# Patient Record
Sex: Female | Born: 1950 | Race: Black or African American | Hispanic: No | Marital: Single | Smoking: Never smoker
Health system: Southern US, Community
[De-identification: ages and names within clinical notes are randomized; demographics above are authoritative.]

## PROBLEM LIST (undated history)

## (undated) DIAGNOSIS — R200 Anesthesia of skin: Secondary | ICD-10-CM

## (undated) DIAGNOSIS — R42 Dizziness and giddiness: Secondary | ICD-10-CM

## (undated) DIAGNOSIS — Z972 Presence of dental prosthetic device (complete) (partial): Secondary | ICD-10-CM

## (undated) DIAGNOSIS — Z974 Presence of external hearing-aid: Secondary | ICD-10-CM

## (undated) DIAGNOSIS — R112 Nausea with vomiting, unspecified: Secondary | ICD-10-CM

## (undated) DIAGNOSIS — M1990 Unspecified osteoarthritis, unspecified site: Secondary | ICD-10-CM

## (undated) DIAGNOSIS — R0602 Shortness of breath: Secondary | ICD-10-CM

## (undated) DIAGNOSIS — I10 Essential (primary) hypertension: Secondary | ICD-10-CM

## (undated) DIAGNOSIS — T8859XA Other complications of anesthesia, initial encounter: Secondary | ICD-10-CM

## (undated) DIAGNOSIS — T4145XA Adverse effect of unspecified anesthetic, initial encounter: Secondary | ICD-10-CM

## (undated) DIAGNOSIS — Z9889 Other specified postprocedural states: Secondary | ICD-10-CM

## (undated) HISTORY — PX: HEEL SPUR SURGERY: SHX665

## (undated) HISTORY — PX: CARPAL TUNNEL RELEASE: SHX101

## (undated) HISTORY — PX: FOOT SURGERY: SHX648

## (undated) HISTORY — PX: TUBAL LIGATION: SHX77

---

## 2003-05-05 HISTORY — PX: BREAST SURGERY: SHX581

## 2005-01-29 ENCOUNTER — Ambulatory Visit: Payer: Self-pay

## 2005-01-30 ENCOUNTER — Ambulatory Visit: Payer: Self-pay | Admitting: Podiatry

## 2005-03-23 ENCOUNTER — Ambulatory Visit: Payer: Self-pay | Admitting: Gastroenterology

## 2006-03-02 ENCOUNTER — Inpatient Hospital Stay: Payer: Self-pay | Admitting: Internal Medicine

## 2006-03-02 ENCOUNTER — Other Ambulatory Visit: Payer: Self-pay

## 2008-05-04 HISTORY — PX: BACK SURGERY: SHX140

## 2008-10-17 ENCOUNTER — Ambulatory Visit: Payer: Self-pay | Admitting: Obstetrics and Gynecology

## 2008-10-24 ENCOUNTER — Ambulatory Visit: Payer: Self-pay | Admitting: Obstetrics and Gynecology

## 2009-05-11 ENCOUNTER — Ambulatory Visit: Payer: Self-pay | Admitting: General Practice

## 2009-06-17 ENCOUNTER — Ambulatory Visit: Payer: Self-pay | Admitting: Unknown Physician Specialty

## 2009-06-21 ENCOUNTER — Ambulatory Visit: Payer: Self-pay | Admitting: Unknown Physician Specialty

## 2011-02-24 ENCOUNTER — Ambulatory Visit: Payer: Self-pay | Admitting: Family Medicine

## 2011-03-04 ENCOUNTER — Ambulatory Visit: Payer: Self-pay | Admitting: Family Medicine

## 2011-03-13 ENCOUNTER — Ambulatory Visit: Payer: Self-pay | Admitting: Unknown Physician Specialty

## 2011-03-17 ENCOUNTER — Emergency Department: Payer: Self-pay | Admitting: Emergency Medicine

## 2011-03-31 ENCOUNTER — Ambulatory Visit: Payer: Self-pay | Admitting: Surgery

## 2011-03-31 HISTORY — PX: BREAST BIOPSY: SHX20

## 2011-04-01 LAB — PATHOLOGY REPORT

## 2011-04-22 ENCOUNTER — Encounter: Payer: Self-pay | Admitting: Physician Assistant

## 2011-05-05 ENCOUNTER — Encounter: Payer: Self-pay | Admitting: Physician Assistant

## 2011-05-05 HISTORY — PX: JOINT REPLACEMENT: SHX530

## 2011-09-09 ENCOUNTER — Ambulatory Visit: Payer: Self-pay | Admitting: Unknown Physician Specialty

## 2011-09-09 LAB — POTASSIUM: Potassium: 3.5 mmol/L (ref 3.5–5.1)

## 2011-09-16 ENCOUNTER — Ambulatory Visit: Payer: Self-pay | Admitting: Unknown Physician Specialty

## 2011-09-21 ENCOUNTER — Emergency Department: Payer: Self-pay | Admitting: Emergency Medicine

## 2011-09-21 LAB — COMPREHENSIVE METABOLIC PANEL
Albumin: 3.4 g/dL (ref 3.4–5.0)
Alkaline Phosphatase: 99 U/L (ref 50–136)
Anion Gap: 8 (ref 7–16)
BUN: 12 mg/dL (ref 7–18)
Bilirubin,Total: 0.3 mg/dL (ref 0.2–1.0)
Calcium, Total: 8.6 mg/dL (ref 8.5–10.1)
Chloride: 104 mmol/L (ref 98–107)
Co2: 30 mmol/L (ref 21–32)
Creatinine: 0.81 mg/dL (ref 0.60–1.30)
Glucose: 81 mg/dL (ref 65–99)
Osmolality: 282 (ref 275–301)
SGOT(AST): 24 U/L (ref 15–37)
SGPT (ALT): 24 U/L
Sodium: 142 mmol/L (ref 136–145)

## 2011-09-21 LAB — CBC
MCH: 28.8 pg (ref 26.0–34.0)
MCV: 88 fL (ref 80–100)
Platelet: 335 10*3/uL (ref 150–440)
RBC: 4.7 10*6/uL (ref 3.80–5.20)
RDW: 13.7 % (ref 11.5–14.5)

## 2011-09-21 LAB — URINALYSIS, COMPLETE
Bilirubin,UR: NEGATIVE
Nitrite: NEGATIVE
Ph: 6 (ref 4.5–8.0)
Protein: NEGATIVE
RBC,UR: 1 /HPF (ref 0–5)
Specific Gravity: 1.018 (ref 1.003–1.030)
Squamous Epithelial: 1
WBC UR: <1 /HPF (ref 0–5)

## 2012-05-16 ENCOUNTER — Encounter: Payer: Self-pay | Admitting: Rehabilitation

## 2012-06-04 ENCOUNTER — Encounter: Payer: Self-pay | Admitting: Rehabilitation

## 2012-07-01 LAB — COMPREHENSIVE METABOLIC PANEL
Alkaline Phosphatase: 146 U/L — ABNORMAL HIGH (ref 50–136)
Anion Gap: 4 — ABNORMAL LOW (ref 7–16)
EGFR (African American): >60
EGFR (Non-African Amer.): >60
Glucose: 102 mg/dL — ABNORMAL HIGH (ref 65–99)
Potassium: 3.6 mmol/L (ref 3.5–5.1)
SGOT(AST): 27 U/L (ref 15–37)
SGPT (ALT): 28 U/L (ref 12–78)
Sodium: 139 mmol/L (ref 136–145)
Total Protein: 7.5 g/dL (ref 6.4–8.2)

## 2012-07-01 LAB — URINALYSIS, COMPLETE
Blood: NEGATIVE
Ketone: NEGATIVE
Nitrite: NEGATIVE
RBC,UR: NONE SEEN /HPF (ref 0–5)
Squamous Epithelial: <1
WBC UR: 1 /HPF (ref 0–5)

## 2012-07-01 LAB — CBC
HCT: 40.8 % (ref 35.0–47.0)
HGB: 13.1 g/dL (ref 12.0–16.0)
RBC: 4.69 10*6/uL (ref 3.80–5.20)
RDW: 13.7 % (ref 11.5–14.5)
WBC: 8.3 10*3/uL (ref 3.6–11.0)

## 2012-07-01 LAB — CK TOTAL AND CKMB (NOT AT ARMC)
CK, Total: 134 U/L (ref 21–215)
CK-MB: 0.8 ng/mL (ref 0.5–3.6)

## 2012-07-01 LAB — TROPONIN I: Troponin-I: <0.02 ng/mL

## 2012-07-02 ENCOUNTER — Encounter: Payer: Self-pay | Admitting: Rehabilitation

## 2012-07-02 ENCOUNTER — Observation Stay: Payer: Self-pay | Admitting: Specialist

## 2012-07-03 LAB — SEDIMENTATION RATE: Erythrocyte Sed Rate: 13 mm/hr (ref 0–30)

## 2012-07-03 LAB — CBC WITH DIFFERENTIAL/PLATELET
Basophil %: 0.7 %
Eosinophil #: 0.1 10*3/uL (ref 0.0–0.7)
Eosinophil %: 1.5 %
HGB: 12.2 g/dL (ref 12.0–16.0)
Lymphocyte #: 2.4 10*3/uL (ref 1.0–3.6)
Lymphocyte %: 31.9 %
MCH: 28.5 pg (ref 26.0–34.0)
MCV: 87 fL (ref 80–100)
Monocyte #: 0.7 x10 3/mm (ref 0.2–0.9)
Monocyte %: 9.4 %
Neutrophil #: 4.2 10*3/uL (ref 1.4–6.5)
Neutrophil %: 56.5 %
Platelet: 320 10*3/uL (ref 150–440)
RDW: 14 % (ref 11.5–14.5)
WBC: 7.4 10*3/uL (ref 3.6–11.0)

## 2012-07-03 LAB — BASIC METABOLIC PANEL
BUN: 10 mg/dL (ref 7–18)
Calcium, Total: 8.7 mg/dL (ref 8.5–10.1)
Chloride: 108 mmol/L — ABNORMAL HIGH (ref 98–107)
Co2: 31 mmol/L (ref 21–32)
EGFR (African American): >60
Glucose: 85 mg/dL (ref 65–99)
Potassium: 3.2 mmol/L — ABNORMAL LOW (ref 3.5–5.1)
Sodium: 143 mmol/L (ref 136–145)

## 2012-11-22 ENCOUNTER — Other Ambulatory Visit: Payer: Self-pay | Admitting: Orthopedic Surgery

## 2012-11-22 MED ORDER — BUPIVACAINE LIPOSOME 1.3 % IJ SUSP: 20.0000 mL | Freq: Once | INTRAMUSCULAR | Status: DC

## 2012-11-22 MED ORDER — DEXAMETHASONE SODIUM PHOSPHATE 10 MG/ML IJ SOLN: 10.0000 mg | Freq: Once | INTRAMUSCULAR | Status: DC

## 2012-11-22 NOTE — Progress Notes (Signed)
Preoperative surgical orders have been place into the Epic hospital system for [name] [name] on 11/22/2012, 6:45 PM  by Patrica Duel for surgery on 12/07/2012.  Preop Total Knee orders including Experal, IV Tylenol, and IV Decadron as long as there are no contraindications to the above medications. [name] Peace, PA-C

## 2012-11-28 ENCOUNTER — Encounter (HOSPITAL_COMMUNITY): Payer: Self-pay | Admitting: Pharmacy Technician

## 2012-11-30 ENCOUNTER — Encounter (HOSPITAL_COMMUNITY): Payer: Self-pay

## 2012-11-30 ENCOUNTER — Other Ambulatory Visit (HOSPITAL_COMMUNITY): Payer: Self-pay | Admitting: Orthopedic Surgery

## 2012-11-30 NOTE — Progress Notes (Signed)
Clearance with OV and EKG, CBC, CMET  CHART  DR Burnadette Pop

## 2012-11-30 NOTE — Patient Instructions (Addendum)
20 Iretha Kirley  11/30/2012   Your procedure is scheduled on:  12/07/12  [facility]  Report to [facility] at   1130    AM.  Call this number if you have problems the morning of surgery: [phone]       Remember:   Do not eat food after MIDNIGHT Tuesday NIGHT---MAY HAVE CLEAR LIQUIDS UNTIL 0800AM Wednesday MORNING, THEN NOTHING   Take these medicines the morning of surgery with A SIP OF WATER:  NONE   .  Contacts, dentures or partial plates can not be worn to surgery  Leave suitcase in the car. After surgery it may be brought to your room.  For patients admitted to the hospital, checkout time is 11:00 AM day of  discharge.             SPECIAL INSTRUCTIONS- SEE [facility] PREPARING FOR SURGERY INSTRUCTION SHEET-     DO NOT WEAR JEWELRY, LOTIONS, POWDERS, OR PERFUMES.  WOMEN-- DO NOT SHAVE LEGS OR UNDERARMS FOR 12 HOURS BEFORE SHOWERS. MEN MAY SHAVE FACE.  Patients discharged the day of surgery will not be allowed to drive home. IF going home the day of surgery, you must have a driver and someone to stay with you for the first 24 hours  Name and phone number of your driver: admission                                                                       Please read over the following fact sheets that you were given: MRSA Information, Incentive Spirometry Sheet, Blood Transfusion Sheet  Information                                                                                   Ragnar Waas  PST [phone]  [phone]                 FAILURE TO FOLLOW THESE INSTRUCTIONS MAY RESULT IN  CANCELLATION   OF YOUR SURGERY                                                  Patient Signature _____________________________

## 2012-12-01 ENCOUNTER — Ambulatory Visit (HOSPITAL_COMMUNITY)
Admission: RE | Admit: 2012-12-01 | Discharge: 2012-12-01 | Disposition: A | Discharge status: Home / Self Care | Payer: No Typology Code available for payment source | Source: Ambulatory Visit | Attending: Orthopedic Surgery | Admitting: Orthopedic Surgery

## 2012-12-01 ENCOUNTER — Encounter (HOSPITAL_COMMUNITY): Payer: Self-pay

## 2012-12-01 ENCOUNTER — Encounter (HOSPITAL_COMMUNITY)
Admission: RE | Admit: 2012-12-01 | Discharge: 2012-12-01 | Disposition: A | Discharge status: Home / Self Care | Payer: No Typology Code available for payment source | Source: Ambulatory Visit | Attending: Orthopedic Surgery | Admitting: Orthopedic Surgery

## 2012-12-01 DIAGNOSIS — M24669 Ankylosis, unspecified knee: Secondary | ICD-10-CM | POA: Insufficient documentation

## 2012-12-01 DIAGNOSIS — Z01812 Encounter for preprocedural laboratory examination: Secondary | ICD-10-CM | POA: Insufficient documentation

## 2012-12-01 DIAGNOSIS — Z01818 Encounter for other preprocedural examination: Secondary | ICD-10-CM | POA: Insufficient documentation

## 2012-12-01 HISTORY — DX: Dizziness and giddiness: R42

## 2012-12-01 HISTORY — DX: Essential (primary) hypertension: I10

## 2012-12-01 HISTORY — DX: Unspecified osteoarthritis, unspecified site: M19.90

## 2012-12-01 LAB — URINALYSIS, ROUTINE W REFLEX MICROSCOPIC
Bilirubin Urine: NEGATIVE
Ketones, ur: NEGATIVE mg/dL
Nitrite: NEGATIVE
Protein, ur: NEGATIVE mg/dL
pH: 6 (ref 5.0–8.0)

## 2012-12-01 LAB — URINE MICROSCOPIC-ADD ON

## 2012-12-01 LAB — APTT: aPTT: 28 seconds (ref 24–37)

## 2012-12-01 LAB — PROTIME-INR: INR: 1.01 (ref 0.00–1.49)

## 2012-12-01 NOTE — Progress Notes (Signed)
Faxed u/a with micro to Dr Aluisio through [facility] 

## 2012-12-02 NOTE — Progress Notes (Signed)
Received fax from [facility] stating no action  Re: urine report

## 2012-12-04 NOTE — H&P (Signed)
TOTAL KNEE REVISION ADMISSION H&P  Patient is being admitted for left revision total knee arthroplasty vs left knee arthrotomy.  Subjective:  Chief Complaint:left knee pain.  HPI: [name] [name], 62 y.o. female, has a history of pain and functional disability in the left knee(s) due to failed previous arthroplasty and patient has failed non-surgical conservative treatments for greater than 12 weeks to include NSAID's and/or analgesics, flexibility and strengthening excercises, supervised PT with diminished ADL's post treatment, use of assistive devices and activity modification. The indications for the revision of the total knee arthroplasty vs arthrotomy are possible loosening of one or more components and extensive arthrofibrosis. Onset of symptoms was gradual starting 1 year ago with gradually worsening course since that time.  Prior procedures on the left knee(s) include arthroplasty.  Patient currently rates pain in the left knee(s) at 7 out of 10 with activity. There is night pain, worsening of pain with activity and weight bearing, pain that interferes with activities of daily living, pain with passive range of motion and crepitus.  Patient has evidence of quesstionable loosening of the femoral component and abnormal thickness of the patellar component by imaging studies. This condition presents safety issues increasing the risk of falls. There is no current active infection.  Past Medical History  Diagnosis Date  . Hypertension   . Arthritis   . Vertigo     hx post op    Past Surgical History  Procedure Laterality Date  . Back surgery  2010    cervical fusion  . Foot surgery Left     heel  . Joint replacement Left     knee  . Carpal tunnel release Right   . Breast surgery Left     biopsy     Current outpatient prescriptions acetaminophen (TYLENOL) 500 MG tablet, Take 500 mg by mouth every 6 (six) hours as needed for pain., Disp: , Rfl: ;   hydrochlorothiazide (HYDRODIURIL)  25 MG tablet, Take 25 mg by mouth every morning., Disp: , Rfl: ;   ibuprofen (ADVIL,MOTRIN) 200 MG tablet, Take 200 mg by mouth every 6 (six) hours as needed for pain., Disp: , Rfl:  meclizine (ANTIVERT) 25 MG tablet, Take 25 mg by mouth 3 (three) times daily as needed for nausea., Disp: , Rfl:   Allergies  Allergen Reactions  . Oxycodone Other (See Comments)    virtigo    History  Substance Use Topics  . Smoking status: Never Smoker   . Smokeless tobacco: Never Used  . Alcohol Use: No    Family History Father deceased from cancer Mother deceased from MI   Review of Systems  Constitutional: Negative.   HENT: Positive for tinnitus. Negative for hearing loss, ear pain, nosebleeds, congestion, sore throat, neck pain and ear discharge.   Eyes: Negative.   Respiratory: Negative.  Negative for stridor.   Cardiovascular: Negative.   Gastrointestinal: Negative.   Genitourinary: Negative.   Musculoskeletal: Positive for joint pain. Negative for myalgias, back pain and falls.       Left knee pain  Skin: Negative.   Neurological: Negative.  Negative for headaches.  Endo/Heme/Allergies: Negative.   Psychiatric/Behavioral: Negative.      Objective:  Physical Exam  Constitutional: She is oriented to person, place, and time. She appears well-developed and well-nourished. No distress.  HENT:  Head: Normocephalic and atraumatic.  Right Ear: External ear normal.  Left Ear: External ear normal.  Nose: Nose normal.  Mouth/Throat: Oropharynx is clear and moist.  Eyes: Conjunctivae and  EOM are normal.  Neck: Normal range of motion. Neck supple. No tracheal deviation present. No thyromegaly present.  Cardiovascular: Normal rate, regular rhythm, normal heart sounds and intact distal pulses.   No murmur heard. Respiratory: Effort normal and breath sounds normal. No respiratory distress. She has no wheezes. She exhibits no tenderness.  GI: Soft. Bowel sounds are normal. She exhibits no  distension and no mass. There is no tenderness.  Musculoskeletal:       Right hip: Normal.       Left hip: Normal.       Right knee: She exhibits decreased range of motion. She exhibits no swelling, no effusion and no erythema. Tenderness found. Medial joint line and lateral joint line tenderness noted.       Left knee: She exhibits decreased range of motion. She exhibits no swelling, no effusion and no erythema. Tenderness found. Medial joint line and lateral joint line tenderness noted.       Right lower leg: She exhibits no tenderness and no swelling.       Left lower leg: She exhibits no tenderness and no swelling.  Both hips show normal ROM with no discomfort. Her right knee exam is range about 5-125, no effusion. Moderate crepitus on ROM. Slight tenderness medial greater than lateral with no instability. The left knee has a well-healed scar anteriorly. She doesn't have any warmth or swelling about the knee. ROM is about 10 to 25 degrees. There is no instability noted.  Lymphadenopathy:    She has no cervical adenopathy.  Neurological: She is alert and oriented to person, place, and time. She has normal strength and normal reflexes. No sensory deficit.  Skin: No rash noted. No erythema.  Psychiatric: She has a normal mood and affect. Her behavior is normal.    Vitals Weight: 211 lb Height: 71 in Body Surface Area: 2.19 m Body Mass Index: 29.43 kg/m Pulse: 72 (Regular) BP: 124/78 (Sitting, Left Arm, Standard)  Imaging Review Plain radiographs demonstrate severe degenerative joint disease of the left knee(s). The overall alignment is neutral. The bone quality appears to be fair for age and reported activity level. There is increased thickness of the patellar component and questionable loosening of the femoral component.  Assessment/Plan:  End stage arthritis, left knee(s) with failed previous arthroplasty.   The patient history, physical examination, clinical  judgment of the provider and imaging studies are consistent with end stage degenerative joint disease of the left knee(s), previous total knee arthroplasty. Revision total knee arthroplasty is deemed medically necessary. The treatment options including medical management, injection therapy, arthroscopy and revision arthroplasty were discussed at length. The risks and benefits of revision total knee arthroplasty were presented and reviewed. The risks due to aseptic loosening, infection, stiffness, patella tracking problems, thromboembolic complications and other imponderables were discussed. The patient acknowledged the explanation, agreed to proceed with the plan and consent was signed. Patient is being admitted for inpatient treatment for surgery, pain control, PT, OT, prophylactic antibiotics, VTE prophylaxis, progressive ambulation and ADL's and discharge planning.The patient is planning to be discharged home with home health services but is open to SNF placement if PT progressed poorly.     Dimitri Ped, PA-C

## 2012-12-06 NOTE — Progress Notes (Signed)
sister Rayfield Citizen stated would give patient new arrival time and NPO after Midnight tonite.   Had previously left message at pts home and requested call back

## 2012-12-07 ENCOUNTER — Encounter (HOSPITAL_COMMUNITY): Payer: Self-pay | Admitting: Anesthesiology

## 2012-12-07 ENCOUNTER — Encounter (HOSPITAL_COMMUNITY): Payer: Self-pay

## 2012-12-07 ENCOUNTER — Inpatient Hospital Stay (HOSPITAL_COMMUNITY)
Admission: RE | Admit: 2012-12-07 | Discharge: 2012-12-09 | DRG: 467 | Disposition: A | Discharge status: Home Health | Payer: No Typology Code available for payment source | Source: Ambulatory Visit | Attending: Orthopedic Surgery | Admitting: Orthopedic Surgery

## 2012-12-07 ENCOUNTER — Encounter (HOSPITAL_COMMUNITY)
Admission: RE | Disposition: A | Discharge status: Home Health | Payer: Self-pay | Source: Ambulatory Visit | Attending: Orthopedic Surgery

## 2012-12-07 ENCOUNTER — Ambulatory Visit (HOSPITAL_COMMUNITY): Payer: No Typology Code available for payment source | Admitting: Anesthesiology

## 2012-12-07 DIAGNOSIS — T84018A Broken internal joint prosthesis, other site, initial encounter: Secondary | ICD-10-CM

## 2012-12-07 DIAGNOSIS — M24669 Ankylosis, unspecified knee: Secondary | ICD-10-CM | POA: Diagnosis present

## 2012-12-07 DIAGNOSIS — Y831 Surgical operation with implant of artificial internal device as the cause of abnormal reaction of the patient, or of later complication, without mention of misadventure at the time of the procedure: Secondary | ICD-10-CM | POA: Diagnosis present

## 2012-12-07 DIAGNOSIS — T84039A Mechanical loosening of unspecified internal prosthetic joint, initial encounter: Principal | ICD-10-CM | POA: Diagnosis present

## 2012-12-07 DIAGNOSIS — I10 Essential (primary) hypertension: Secondary | ICD-10-CM | POA: Diagnosis present

## 2012-12-07 DIAGNOSIS — M1710 Unilateral primary osteoarthritis, unspecified knee: Secondary | ICD-10-CM | POA: Diagnosis present

## 2012-12-07 DIAGNOSIS — Z96652 Presence of left artificial knee joint: Secondary | ICD-10-CM

## 2012-12-07 DIAGNOSIS — Z96659 Presence of unspecified artificial knee joint: Secondary | ICD-10-CM

## 2012-12-07 DIAGNOSIS — D62 Acute posthemorrhagic anemia: Secondary | ICD-10-CM | POA: Diagnosis not present

## 2012-12-07 HISTORY — DX: Adverse effect of unspecified anesthetic, initial encounter: T41.45XA

## 2012-12-07 HISTORY — DX: Other specified postprocedural states: Z98.890

## 2012-12-07 HISTORY — PX: TOTAL KNEE REVISION: SHX996

## 2012-12-07 HISTORY — DX: Other complications of anesthesia, initial encounter: T88.59XA

## 2012-12-07 HISTORY — DX: Other specified postprocedural states: R11.2

## 2012-12-07 LAB — TYPE AND SCREEN
ABO/RH(D): O POS
Antibody Screen: NEGATIVE

## 2012-12-07 LAB — ABO/RH: ABO/RH(D): O POS

## 2012-12-07 SURGERY — TOTAL KNEE REVISION
Anesthesia: General | Site: Knee | Laterality: Left | Wound class: Clean

## 2012-12-07 MED ORDER — CEFAZOLIN SODIUM-DEXTROSE 2-3 GM-% IV SOLR
INTRAVENOUS | Status: AC
  Filled 2012-12-07: qty 50

## 2012-12-07 MED ORDER — ACETAMINOPHEN 10 MG/ML IV SOLN
1000.0000 mg | Freq: Once | INTRAVENOUS | Status: DC
  Filled 2012-12-07: qty 100

## 2012-12-07 MED ORDER — METOCLOPRAMIDE HCL 5 MG/ML IJ SOLN: 5.0000 mg | Freq: Three times a day (TID) | INTRAMUSCULAR | Status: DC | PRN

## 2012-12-07 MED ORDER — ONDANSETRON HCL 4 MG/2ML IJ SOLN: 4.0000 mg | Freq: Four times a day (QID) | INTRAMUSCULAR | Status: DC | PRN

## 2012-12-07 MED ORDER — ACETAMINOPHEN 10 MG/ML IV SOLN
INTRAVENOUS | Status: DC | PRN
  Administered 2012-12-07: 1000 mg via INTRAVENOUS

## 2012-12-07 MED ORDER — DEXAMETHASONE SODIUM PHOSPHATE 10 MG/ML IJ SOLN
10.0000 mg | Freq: Every day | INTRAMUSCULAR | Status: AC
  Filled 2012-12-07: qty 1

## 2012-12-07 MED ORDER — DEXAMETHASONE 6 MG PO TABS
10.0000 mg | ORAL_TABLET | Freq: Every day | ORAL | Status: AC
  Administered 2012-12-08: 10 mg via ORAL
  Filled 2012-12-07: qty 1

## 2012-12-07 MED ORDER — PROPOFOL 10 MG/ML IV BOLUS
INTRAVENOUS | Status: DC | PRN
  Administered 2012-12-07: 150 mg via INTRAVENOUS

## 2012-12-07 MED ORDER — HYDROMORPHONE HCL PF 1 MG/ML IJ SOLN
0.2500 mg | INTRAMUSCULAR | Status: DC | PRN
  Administered 2012-12-07: 0.5 mg via INTRAVENOUS

## 2012-12-07 MED ORDER — SODIUM CHLORIDE 0.9 % IV SOLN: INTRAVENOUS | Status: DC

## 2012-12-07 MED ORDER — LACTATED RINGERS IV SOLN
INTRAVENOUS | Status: DC
  Administered 2012-12-07: 1000 mL via INTRAVENOUS
  Administered 2012-12-07 (×2): via INTRAVENOUS

## 2012-12-07 MED ORDER — BUPIVACAINE HCL (PF) 0.25 % IJ SOLN
INTRAMUSCULAR | Status: AC
  Filled 2012-12-07: qty 30

## 2012-12-07 MED ORDER — HYDROCHLOROTHIAZIDE 25 MG PO TABS
25.0000 mg | ORAL_TABLET | Freq: Every morning | ORAL | Status: DC
  Administered 2012-12-08 – 2012-12-09 (×2): 25 mg via ORAL
  Filled 2012-12-07 (×2): qty 1

## 2012-12-07 MED ORDER — POLYETHYLENE GLYCOL 3350 17 G PO PACK: 17.0000 g | PACK | Freq: Every day | ORAL | Status: DC | PRN

## 2012-12-07 MED ORDER — SODIUM CHLORIDE 0.9 % IJ SOLN
INTRAMUSCULAR | Status: DC | PRN
  Administered 2012-12-07: 13:00:00

## 2012-12-07 MED ORDER — MENTHOL 3 MG MT LOZG: 1.0000 | LOZENGE | OROMUCOSAL | Status: DC | PRN

## 2012-12-07 MED ORDER — BUPIVACAINE LIPOSOME 1.3 % IJ SUSP
20.0000 mL | Freq: Once | INTRAMUSCULAR | Status: DC
  Filled 2012-12-07: qty 20

## 2012-12-07 MED ORDER — LACTATED RINGERS IV SOLN: INTRAVENOUS | Status: DC

## 2012-12-07 MED ORDER — DEXTROSE-NACL 5-0.9 % IV SOLN
INTRAVENOUS | Status: DC
  Administered 2012-12-07 – 2012-12-08 (×2): via INTRAVENOUS

## 2012-12-07 MED ORDER — ONDANSETRON HCL 4 MG/2ML IJ SOLN
INTRAMUSCULAR | Status: DC | PRN
  Administered 2012-12-07 (×2): 2 mg via INTRAVENOUS

## 2012-12-07 MED ORDER — METHOCARBAMOL 500 MG PO TABS
500.0000 mg | ORAL_TABLET | Freq: Four times a day (QID) | ORAL | Status: DC | PRN
  Administered 2012-12-07 – 2012-12-08 (×4): 500 mg via ORAL
  Filled 2012-12-07 (×5): qty 1

## 2012-12-07 MED ORDER — CEFAZOLIN SODIUM-DEXTROSE 2-3 GM-% IV SOLR
2.0000 g | INTRAVENOUS | Status: AC
  Administered 2012-12-07: 2 g via INTRAVENOUS

## 2012-12-07 MED ORDER — HYDROMORPHONE HCL PF 1 MG/ML IJ SOLN
INTRAMUSCULAR | Status: AC
  Filled 2012-12-07: qty 1

## 2012-12-07 MED ORDER — ONDANSETRON HCL 4 MG PO TABS: 4.0000 mg | ORAL_TABLET | Freq: Four times a day (QID) | ORAL | Status: DC | PRN

## 2012-12-07 MED ORDER — DOCUSATE SODIUM 100 MG PO CAPS
100.0000 mg | ORAL_CAPSULE | Freq: Two times a day (BID) | ORAL | Status: DC
  Administered 2012-12-07 – 2012-12-09 (×4): 100 mg via ORAL

## 2012-12-07 MED ORDER — FENTANYL CITRATE 0.05 MG/ML IJ SOLN
INTRAMUSCULAR | Status: DC | PRN
  Administered 2012-12-07: 25 ug via INTRAVENOUS
  Administered 2012-12-07: 75 ug via INTRAVENOUS
  Administered 2012-12-07: 25 ug via INTRAVENOUS
  Administered 2012-12-07: 50 ug via INTRAVENOUS
  Administered 2012-12-07: 25 ug via INTRAVENOUS
  Administered 2012-12-07: 50 ug via INTRAVENOUS
  Administered 2012-12-07: 100 ug via INTRAVENOUS

## 2012-12-07 MED ORDER — ACETAMINOPHEN 500 MG PO TABS
1000.0000 mg | ORAL_TABLET | Freq: Four times a day (QID) | ORAL | Status: AC
  Administered 2012-12-07 – 2012-12-08 (×4): 1000 mg via ORAL
  Filled 2012-12-07 (×5): qty 2

## 2012-12-07 MED ORDER — HYDROMORPHONE HCL PF 1 MG/ML IJ SOLN
0.5000 mg | INTRAMUSCULAR | Status: DC | PRN
  Administered 2012-12-07: 1 mg via INTRAVENOUS
  Filled 2012-12-07: qty 1

## 2012-12-07 MED ORDER — FLEET ENEMA 7-19 GM/118ML RE ENEM: 1.0000 | ENEMA | Freq: Once | RECTAL | Status: AC | PRN

## 2012-12-07 MED ORDER — SCOPOLAMINE 1 MG/3DAYS TD PT72
1.0000 | MEDICATED_PATCH | TRANSDERMAL | Status: DC
  Administered 2012-12-07: 1.5 mg via TRANSDERMAL
  Filled 2012-12-07: qty 1

## 2012-12-07 MED ORDER — 0.9 % SODIUM CHLORIDE (POUR BTL) OPTIME
TOPICAL | Status: DC | PRN
  Administered 2012-12-07: 1000 mL

## 2012-12-07 MED ORDER — METOCLOPRAMIDE HCL 10 MG PO TABS: 5.0000 mg | ORAL_TABLET | Freq: Three times a day (TID) | ORAL | Status: DC | PRN

## 2012-12-07 MED ORDER — BISACODYL 10 MG RE SUPP: 10.0000 mg | Freq: Every day | RECTAL | Status: DC | PRN

## 2012-12-07 MED ORDER — MECLIZINE HCL 25 MG PO TABS
25.0000 mg | ORAL_TABLET | Freq: Three times a day (TID) | ORAL | Status: DC | PRN
  Filled 2012-12-07: qty 1

## 2012-12-07 MED ORDER — CEFAZOLIN SODIUM 1-5 GM-% IV SOLN
1.0000 g | Freq: Four times a day (QID) | INTRAVENOUS | Status: AC
  Administered 2012-12-07 – 2012-12-08 (×2): 1 g via INTRAVENOUS
  Filled 2012-12-07 (×2): qty 50

## 2012-12-07 MED ORDER — RIVAROXABAN 10 MG PO TABS
10.0000 mg | ORAL_TABLET | Freq: Every day | ORAL | Status: DC
  Administered 2012-12-08 – 2012-12-09 (×2): 10 mg via ORAL
  Filled 2012-12-07 (×3): qty 1

## 2012-12-07 MED ORDER — METHOCARBAMOL 100 MG/ML IJ SOLN
500.0000 mg | Freq: Four times a day (QID) | INTRAVENOUS | Status: DC | PRN
  Administered 2012-12-07: 500 mg via INTRAVENOUS
  Filled 2012-12-07: qty 5

## 2012-12-07 MED ORDER — HYDROMORPHONE HCL PF 1 MG/ML IJ SOLN
INTRAMUSCULAR | Status: DC | PRN
  Administered 2012-12-07 (×2): 1 mg via INTRAVENOUS

## 2012-12-07 MED ORDER — TRAMADOL HCL 50 MG PO TABS: 50.0000 mg | ORAL_TABLET | Freq: Four times a day (QID) | ORAL | Status: DC | PRN

## 2012-12-07 MED ORDER — KETOROLAC TROMETHAMINE 15 MG/ML IJ SOLN
7.5000 mg | Freq: Four times a day (QID) | INTRAMUSCULAR | Status: AC | PRN
  Administered 2012-12-07 – 2012-12-08 (×2): 7.5 mg via INTRAVENOUS
  Filled 2012-12-07 (×2): qty 1

## 2012-12-07 MED ORDER — SCOPOLAMINE 1 MG/3DAYS TD PT72
MEDICATED_PATCH | TRANSDERMAL | Status: AC
  Filled 2012-12-07: qty 1

## 2012-12-07 MED ORDER — ROCURONIUM BROMIDE 100 MG/10ML IV SOLN
INTRAVENOUS | Status: DC | PRN
  Administered 2012-12-07: 40 mg via INTRAVENOUS

## 2012-12-07 MED ORDER — DIPHENHYDRAMINE HCL 12.5 MG/5ML PO ELIX: 12.5000 mg | ORAL_SOLUTION | ORAL | Status: DC | PRN

## 2012-12-07 MED ORDER — HYDROMORPHONE HCL 2 MG PO TABS
2.0000 mg | ORAL_TABLET | ORAL | Status: DC | PRN
  Administered 2012-12-07: 2 mg via ORAL
  Administered 2012-12-08 – 2012-12-09 (×7): 4 mg via ORAL
  Filled 2012-12-07 (×4): qty 2
  Filled 2012-12-07: qty 1
  Filled 2012-12-07 (×4): qty 2

## 2012-12-07 MED ORDER — MIDAZOLAM HCL 5 MG/5ML IJ SOLN
INTRAMUSCULAR | Status: DC | PRN
  Administered 2012-12-07: 2 mg via INTRAVENOUS

## 2012-12-07 MED ORDER — LIDOCAINE HCL (CARDIAC) 20 MG/ML IV SOLN
INTRAVENOUS | Status: DC | PRN
  Administered 2012-12-07: 50 mg via INTRAVENOUS

## 2012-12-07 MED ORDER — SODIUM CHLORIDE 0.9 % IR SOLN
Status: DC | PRN
  Administered 2012-12-07: 1000 mL

## 2012-12-07 MED ORDER — BUPIVACAINE HCL 0.25 % IJ SOLN
INTRAMUSCULAR | Status: DC | PRN
  Administered 2012-12-07: 20 mL

## 2012-12-07 MED ORDER — DEXAMETHASONE SODIUM PHOSPHATE 10 MG/ML IJ SOLN
INTRAMUSCULAR | Status: DC | PRN
  Administered 2012-12-07: 10 mg via INTRAVENOUS

## 2012-12-07 MED ORDER — PHENOL 1.4 % MT LIQD: 1.0000 | OROMUCOSAL | Status: DC | PRN

## 2012-12-07 MED ORDER — PHENYLEPHRINE HCL 10 MG/ML IJ SOLN
INTRAMUSCULAR | Status: DC | PRN
  Administered 2012-12-07: 40 ug via INTRAVENOUS

## 2012-12-07 SURGICAL SUPPLY — 66 items
ADAPTER BOLT FEMORAL +2/-2 (Knees) ×3 IMPLANT
ANCHOR SUPER QUICK (Anchor) ×3 IMPLANT
BAG ZIPLOCK 12X15 (MISCELLANEOUS) ×3 IMPLANT
BANDAGE ELASTIC 6 VELCRO ST LF (GAUZE/BANDAGES/DRESSINGS) ×3 IMPLANT
BANDAGE ESMARK 6X9 LF (GAUZE/BANDAGES/DRESSINGS) ×2 IMPLANT
BLADE SAG 18X100X1.27 (BLADE) ×3 IMPLANT
BLADE SAW SGTL 11.0X1.19X90.0M (BLADE) ×3 IMPLANT
BNDG ESMARK 6X9 LF (GAUZE/BANDAGES/DRESSINGS) ×3
BONE CEMENT GENTAMICIN (Cement) ×9 IMPLANT
CEMENT BONE GENTAMICIN 40 (Cement) ×6 IMPLANT
CEMENT RESTRICTOR DEPUY SZ 4 (Cement) ×3 IMPLANT
CLOTH BEACON ORANGE TIMEOUT ST (SAFETY) ×3 IMPLANT
CUFF TOURN SGL QUICK 34 (TOURNIQUET CUFF) ×1
CUFF TRNQT CYL 34X4X40X1 (TOURNIQUET CUFF) ×2 IMPLANT
DRAPE EXTREMITY T 121X128X90 (DRAPE) ×3 IMPLANT
DRAPE POUCH INSTRU U-SHP 10X18 (DRAPES) ×3 IMPLANT
DRAPE U-SHAPE 47X51 STRL (DRAPES) ×3 IMPLANT
DRSG ADAPTIC 3X8 NADH LF (GAUZE/BANDAGES/DRESSINGS) ×3 IMPLANT
DRSG PAD ABDOMINAL 8X10 ST (GAUZE/BANDAGES/DRESSINGS) ×3 IMPLANT
DURAPREP 26ML APPLICATOR (WOUND CARE) ×3 IMPLANT
ELECT REM PT RETURN 9FT ADLT (ELECTROSURGICAL) ×3
ELECTRODE REM PT RTRN 9FT ADLT (ELECTROSURGICAL) ×2 IMPLANT
EVACUATOR 1/8 PVC DRAIN (DRAIN) ×3 IMPLANT
FACESHIELD LNG OPTICON STERILE (SAFETY) ×12 IMPLANT
FEM TC3 PFC SZ3 LEFT (Orthopedic Implant) ×3 IMPLANT
FEMORAL ADAPTER (Orthopedic Implant) ×3 IMPLANT
FEMORAL TC3 PFC SZ3 LEFT (Orthopedic Implant) ×2 IMPLANT
GLOVE BIO SURGEON STRL SZ7.5 (GLOVE) ×3 IMPLANT
GLOVE BIO SURGEON STRL SZ8 (GLOVE) ×3 IMPLANT
GLOVE BIOGEL PI IND STRL 8 (GLOVE) ×4 IMPLANT
GLOVE BIOGEL PI INDICATOR 8 (GLOVE) ×2
GOWN STRL NON-REIN LRG LVL3 (GOWN DISPOSABLE) ×3 IMPLANT
GOWN STRL REIN XL XLG (GOWN DISPOSABLE) ×6 IMPLANT
HANDPIECE INTERPULSE COAX TIP (DISPOSABLE) ×1
IMMOBILIZER KNEE 20 (SOFTGOODS) ×3
IMMOBILIZER KNEE 20 THIGH 36 (SOFTGOODS) ×2 IMPLANT
INSERT TC3 RP TIBIAL SZ 3.0 (Knees) ×3 IMPLANT
KIT BASIN OR (CUSTOM PROCEDURE TRAY) ×3 IMPLANT
MANIFOLD NEPTUNE II (INSTRUMENTS) ×3 IMPLANT
NDL SAFETY ECLIPSE 18X1.5 (NEEDLE) ×4 IMPLANT
NEEDLE HYPO 18GX1.5 SHARP (NEEDLE) ×2
NS IRRIG 1000ML POUR BTL (IV SOLUTION) ×3 IMPLANT
PACK TOTAL JOINT (CUSTOM PROCEDURE TRAY) ×3 IMPLANT
PADDING CAST COTTON 6X4 STRL (CAST SUPPLIES) ×3 IMPLANT
PATELLA DOME PFC 38MM (Knees) ×3 IMPLANT
POSITIONER SURGICAL ARM (MISCELLANEOUS) ×3 IMPLANT
SET HNDPC FAN SPRY TIP SCT (DISPOSABLE) ×2 IMPLANT
SPONGE GAUZE 4X4 12PLY (GAUZE/BANDAGES/DRESSINGS) ×3 IMPLANT
STAPLER VISISTAT 35W (STAPLE) ×3 IMPLANT
STEM TIBIA PFC 13X30MM (Stem) ×3 IMPLANT
STEM UNIVERSAL REVISION 75X18 (Stem) ×3 IMPLANT
STRIP CLOSURE SKIN 1/2X4 (GAUZE/BANDAGES/DRESSINGS) ×6 IMPLANT
SUCTION FRAZIER 12FR DISP (SUCTIONS) ×3 IMPLANT
SUT PDS AB 1 CT1 27 (SUTURE) ×12 IMPLANT
SUT VIC AB 2-0 CT1 27 (SUTURE) ×3
SUT VIC AB 2-0 CT1 TAPERPNT 27 (SUTURE) ×6 IMPLANT
SYR 20CC LL (SYRINGE) ×3 IMPLANT
SYR 50ML LL SCALE MARK (SYRINGE) ×3 IMPLANT
TOWEL OR 17X26 10 PK STRL BLUE (TOWEL DISPOSABLE) ×3 IMPLANT
TOWER CARTRIDGE SMART MIX (DISPOSABLE) ×3 IMPLANT
TRAY FOLEY CATH 14FRSI W/METER (CATHETERS) ×3 IMPLANT
TRAY REVISION SZ 2.5 (Knees) ×3 IMPLANT
TRAY SLEEVE CEM ML (Knees) ×3 IMPLANT
WATER STERILE IRR 1500ML POUR (IV SOLUTION) ×6 IMPLANT
WEDGE STEP 2.5 10MM (Knees) ×6 IMPLANT
WRAP KNEE MAXI GEL POST OP (GAUZE/BANDAGES/DRESSINGS) ×3 IMPLANT

## 2012-12-07 NOTE — Op Note (Signed)
NAMECONSUELLA, SCURLOCK NO.:  [id number]  MEDICAL RECORD NO.:  [id number]  LOCATION:  1620                         FACILITY:  [facility]  PHYSICIAN:  Ollen Gross, M.D.    DATE OF BIRTH:  [birth year]  DATE OF PROCEDURE:  12/07/2012 DATE OF DISCHARGE:                              OPERATIVE REPORT   PREOPERATIVE DIAGNOSIS:  Arthrofibrosis, left knee.  POSTOPERATIVE DIAGNOSIS:  Arthrofibrosis, left knee, loose left total knee arthroplasty.  PROCEDURE:  Left total knee arthroplasty revision.  SURGEON:  Ollen Gross, M.D.  ASSISTANT:  Avel Peace PA-C.  ANESTHESIA:  General.  ESTIMATED BLOOD LOSS:  Minimal.  DRAINS:  Hemovac x1.  TOURNIQUET TIME:  60 minutes at 300 mmHg, down at 6 minutes, and then up additional 30 minutes at 300 mmHg.  COMPLICATIONS:  None.  CONDITION:  Stable to recovery.  BRIEF CLINICAL NOTE:  [name] is a 62 year old female, had a left total knee arthroplasty done in 2013.  She has had persistent pain and severe stiffness with only approximately 25-30 degrees of flexion.  I saw her in second opinion and was concerned that she had excess scar tissue formation and also had possibly loose femoral component.  This was a press fit porous-coated femoral component, and there was a lucency around the trochlear flange.  Given the significant stiffness and pain, I felt that she may have loosened this component up.  She presents now for arthrotomy with scar excision and possible revision if the component is loose.  PROCEDURE IN DETAIL:  After successful administration of general anesthetic, tourniquet was placed high on the left thigh and left lower extremity, prepped and draped in the usual sterile fashion.  Exam under anesthesia showed flexion only to 30 degrees.  The left lower extremity was wrapped in Esmarch, and tourniquet inflated to 300 mmHg.  A midline incision was made with a 10 blade through subcutaneous tissue to the extensor  mechanism.  Fresh blade was used to make a medial parapatellar arthrotomy.  Soft tissue on the proximal medial tibia subperiosteally elevated to the joint line with a knife and into the semimembranosus bursa with Cobb elevator.  There was a tremendous amount of hypertrophic scar tissue in the joint.  The medial lateral gutters were completely obliterated with scar.  I first recreated the medial gutter and excised thick scar all around the medial aspect of the extensor mechanism.  The infrapatellar fat pad is also completely scarred and that was next debrided back to normal-appearing tissue.  The patella could be subluxed laterally, but could not be everted.  The other scar laterally above the patella is also removed.  She had heterotopic bone present in the tissues well above the superior pole of the patella.  This heterotopic bone was removed.  I was unable to evert the patella and did a quadriceps snip, which effectively allowed me to evert the patella and flex the knee up.  When the knee flexed to 70 degrees or more, the tibia completely subluxed forward consistent with instability.  I felt that just removing the scar was not going to be sufficient to solve her problem.  We then inspected the femoral component and there was obvious gap  present between the bony component around the chamfers cuts medially.  I was able to stick an osteotome actually in that space. With general disruption of the interface between the metal and bone, the components were easily removed with no bone loss.  There was a small amount of fibrous ingrowth, but the component overall was easily removed.  Then retracted the tibia anteriorly and placed retractors around the tibia.  We disrupted the interface between the tibial component bone. This was more solidly fixed.  I was able to remove it, however without any damage to the bone or without any bone loss.  We then recreated the entrance to the femoral and tibial  canal.  We thoroughly irrigated both.  On the femoral side, I reamed up to 18 mm.  She had a good press- fit and reamed to 13 mm on tibial side for the cemented stem.  We then placed the extramedullary tibial alignment guide referencing proximally at the medial aspect of the tibial tubercle and distally along the second metatarsal axis and tibial crest.  I pinned the block to remove about 2 mm off the cut bone surface.  Resection was made with an oscillating saw.  Size 2.5 was the most appropriate tibial component and then the proximal tibia is prepared to modular drill and keel punch for the 2.5 MBT revision tray.  I also prepared proximally for a 29 sleeve.  Femur was then prepared.  The 18-mm reamers utilized for intramedullary cutting guide.  The distal femoral cutting block is placed.  I resected at the level of about 1 mm.  Dissection took about 3 mm from the lateral side and barely skin to the medial side.  The Sizer shows that size 3 was most appropriate for the femur.  The rotation for the distal femoral cutting block is matching the epicondylar axis.  This is rotated externally a little more than it was at the original component.  The original component potentially had some internal rotation.  I confirmed the rotation by placing the spacer block with the knee flexed 90 degrees, to create a rectangular flexion gap.  We had the rod placed in a +2 position effectively raising the stem and lowering the flange of the femur down to the bone.  The anterior posterior and chamfer cuts were made barely and the bone resected.  No augments were necessary. Intercondylar block is placed to cut for a TC3 component.  Trials were placed on the tibial side.  Size 2.5 MBT revision tray with a 29 sleeve and 13 x 30 stem extension.  On the femoral side, size 3 TC3 femur with an 18 x 75 stem extension in a +2 position 5 degrees of valgus.  A 17 x 5 inserts placed, even with this there is lot of  laxity. I decided to utilize 10 mm augments medial and lateral on the tibial tray to effectively get the joint line back to normal position.  With those in and with a 15 insert full extension was achieved with excellent stability.  The patella was then everted again and its composite thickness was 28 mm, which I felt was too thick for female, removed the patellar component with an oscillating saw and cut the bone down to 14 mm.  We then placed a 38 template, drilled the lug holes, placed a trial patella and it tracks normally.  A tourniquet was then released, total time of 60 minutes.  While the tourniquet was down for total of 6 minutes,  the components were assembled on the back table.  Minor bleeding stopped with cautery.  After 6 minutes, we wrapped the leg in Esmarch and reinflated to 300 mmHg.  We then removed the trials and trial for the cement restrictor, which was size 4.  Size 4 cement restrictor was placed in the appropriate depth in the tibial canal.  The cut bone surfaces were then prepared with pulsatile lavage.  Cement is mixed with 3 batches of cement and once ready for implantation, it was injected in tibial canal, pressurized.  The tibial components cemented, extruded cement removed.  Femoral components cemented distally with a press-fit stem.  The patella was size 38 and cemented and held with a clamp.  All extruded cements removed from all surfaces.  Fifteen inserts placed, knee held in full extension.  The rest of the cement removed. With the 15 and still a tiny bit of place, one cement was fully hardened.  We then placed the permanent 17.5-mm rotating platform TC3 insert.  Stability was fantastic with full extension and excellent varus valgus stability.  There was also excellent AP stability at 45 and 90 degrees of flexion.  The wound was copiously irrigated with saline solution.  The quad snip was closed with #1 PDS.  Hemovac drain was then placed.  The arthrotomy  closed with interrupted 1 PDS.  There was a tiny bit of the patella tendon that had a partial avulsion.  I put a suture anchor into the tibial tubercle and then resewn the patellar tendon. This led to a stable fixation.  Flexion against gravity was then 90 degrees and the repair was fine.  Tourniquet was then released.  Second tourniquet time of 30 minutes.  Subcu was then closed with interrupted 2- 0 Vicryl, and skin closed with staples.  There was another drain that was placed subcu and that was hooked up to the Hemovac.  Note that 20 mL of Exparel mixed with 30 mL of saline injected into the arthrotomy prior to closure and also into the subcu tissues.  An additional 20 mL of 0.25% bupivacaine was injected into the subcu.  Skin was closed with staples.  Drains hooked to suction.  Incision cleaned and dried and bulky sterile dressing applied.  She was then awakened and transported to recovery in stable condition.  Note that a surgical assistant was a medical necessity for this procedure to perform it in a safe and expeditious manner.  Surgical assistance necessary for retraction of vital neurovascular structures and ligaments to prevent damage to the structures also needed to properly align the limb for proper alignment of the prosthetic components.     Ollen Gross, M.D.     FA/MEDQ  D:  12/07/2012  T:  12/07/2012  Job:  [id number]

## 2012-12-07 NOTE — Anesthesia Preprocedure Evaluation (Addendum)
Anesthesia Evaluation  Patient identified by MRN, date of birth, ID band Patient awake    Reviewed: Allergy & Precautions, H&P , NPO status , Patient's Chart, lab work & pertinent test results  History of Anesthesia Complications (+) PONV  Airway Mallampati: II TM Distance: >3 FB Neck ROM: full    Dental  (+) Edentulous Upper, Missing and Dental Advisory Given All lower sides missing:   Pulmonary neg pulmonary ROS,  breath sounds clear to auscultation  Pulmonary exam normal       Cardiovascular Exercise Tolerance: Good hypertension, Pt. on medications Rhythm:regular Rate:Normal     Neuro/Psych Vertigo.  Back surgery negative neurological ROS  negative psych ROS   GI/Hepatic negative GI ROS, Neg liver ROS,   Endo/Other  negative endocrine ROS  Renal/GU negative Renal ROS  negative genitourinary   Musculoskeletal   Abdominal   Peds  Hematology negative hematology ROS (+)   Anesthesia Other Findings   Reproductive/Obstetrics negative OB ROS                          Anesthesia Physical Anesthesia Plan  ASA: II  Anesthesia Plan: General   Post-op Pain Management:    Induction: Intravenous  Airway Management Planned: Oral ETT  Additional Equipment:   Intra-op Plan:   Post-operative Plan: Extubation in OR  Informed Consent: I have reviewed the patients History and Physical, chart, labs and discussed the procedure including the risks, benefits and alternatives for the proposed anesthesia with the patient or authorized representative who has indicated his/her understanding and acceptance.   Dental Advisory Given  Plan Discussed with: CRNA and Surgeon  Anesthesia Plan Comments:        Anesthesia Quick Evaluation

## 2012-12-07 NOTE — Brief Op Note (Signed)
12/07/2012  1:19 PM  PATIENT:  [name] [name]  62 y.o. female  PRE-OPERATIVE DIAGNOSIS:  Arthrofibrosis of the Left Knee  POST-OPERATIVE DIAGNOSIS:  Arthrofibrosis of the Left Knee  PROCEDURE:  Procedure(s):  TOTAL KNEE ARTHROPLASTY REVISION (Left)  SURGEON:  Surgeon(s) and Role:    Loanne Drilling, MD - Primary  PHYSICIAN ASSISTANT:   ASSISTANTS: Avel Peace, PA-C   ANESTHESIA:   general  EBL:  Total I/O In: 2000 [I.V.:2000] Out: 300 [Urine:150; Blood:150]  BLOOD ADMINISTERED:none  DRAINS: (Medium) Hemovact drain(s) in the left knee with  Suction Open   LOCAL MEDICATIONS USED:  OTHER Exparel  COUNTS:  YES  TOURNIQUET:   Total Tourniquet Time Documented: Thigh (Left) - 61 minutes Thigh (Left) - 30 minutes Total: Thigh (Left) - 91 minutes   DICTATION: .Other Dictation: Dictation Number [id number]  PLAN OF CARE: Admit to inpatient   PATIENT DISPOSITION:  PACU - hemodynamically stable.

## 2012-12-07 NOTE — Anesthesia Postprocedure Evaluation (Signed)
  Anesthesia Post-op Note  Patient: [name] [name]  Procedure(s) Performed: Procedure(s) (LRB):  TOTAL KNEE ARTHROPLASTY REVISION (Left)  Patient Location: PACU  Anesthesia Type: General  Level of Consciousness: awake and alert   Airway and Oxygen Therapy: Patient Spontanous Breathing  Post-op Pain: mild  Post-op Assessment: Post-op Vital signs reviewed, Patient's Cardiovascular Status Stable, Respiratory Function Stable, Patent Airway and No signs of Nausea or vomiting  Last Vitals:  Filed Vitals:   12/07/12 1400  BP: 130/81  Pulse: 65  Temp:   Resp: 14    Post-op Vital Signs: stable   Complications: No apparent anesthesia complications

## 2012-12-07 NOTE — Transfer of Care (Signed)
Immediate Anesthesia Transfer of Care Note  Patient: [name] [name]  Procedure(s) Performed: Procedure(s) (LRB):  TOTAL KNEE ARTHROPLASTY REVISION (Left)  Patient Location: PACU  Anesthesia Type: General  Level of Consciousness: sedated, patient cooperative and responds to stimulaton  Airway & Oxygen Therapy: Patient Spontanous Breathing and Patient connected to face mask oxgen  Post-op Assessment: Report given to PACU RN and Post -op Vital signs reviewed and stable  Post vital signs: Reviewed and stable  Complications: No apparent anesthesia complications

## 2012-12-07 NOTE — Interval H&P Note (Signed)
History and Physical Interval Note:  12/07/2012 10:09 AM  [name] [name]  has presented today for surgery, with the diagnosis of Arthrofibrosis of the Left Knee  The various methods of treatment have been discussed with the patient and family. After consideration of risks, benefits and other options for treatment, the patient has consented to  Procedure(s): LEFT KNEE ARTHROTOMY, SCAR EXCISION (Left) POSSIBLE TOTAL KNEE ARTHROPLASTY REVISION (Left) as a surgical intervention .  The patient's history has been reviewed, patient examined, no change in status, stable for surgery.  I have reviewed the patient's chart and labs.  Questions were answered to the patient's satisfaction.     Loanne Drilling

## 2012-12-08 ENCOUNTER — Encounter (HOSPITAL_COMMUNITY): Payer: Self-pay | Admitting: Orthopedic Surgery

## 2012-12-08 LAB — BASIC METABOLIC PANEL
BUN: 9 mg/dL (ref 6–23)
Creatinine, Ser: 0.61 mg/dL (ref 0.50–1.10)
GFR calc Af Amer: >90 mL/min (ref >90)
GFR calc non Af Amer: >90 mL/min (ref >90)
Potassium: 3.9 mEq/L (ref 3.5–5.1)

## 2012-12-08 LAB — CBC
HCT: 31.9 % — ABNORMAL LOW (ref 36.0–46.0)
MCHC: 31.7 g/dL (ref 30.0–36.0)
RDW: 13.7 % (ref 11.5–15.5)

## 2012-12-08 NOTE — Progress Notes (Signed)
Utilization review completed.  

## 2012-12-08 NOTE — Progress Notes (Signed)
   Subjective: 1 Day Post-Op Procedure(s) (LRB):  TOTAL KNEE ARTHROPLASTY REVISION (Left) Patient reports pain as moderate.   Patient seen in rounds by Dr. Lequita Halt. Patient is well, but has had some minor complaints of pain in the knee, requiring pain medications We will start therapy today.  Plan is to go home  after hospital stay.  Objective: Vital signs in last 24 hours: Temp:  [97.4 F (36.3 C)-98.2 F (36.8 C)] 98 F (36.7 C) (08/07 0558) Pulse Rate:  [56-77] 58 (08/07 0558) Resp:  [12-20] 16 (08/07 0558) BP: (95-132)/(60-85) 95/60 mmHg (08/07 0558) SpO2:  [94 %-100 %] 97 % (08/07 0558) FiO2 (%):  [97 %] 97 % (08/06 1515) Weight:  [98.884 kg (218 lb)] 98.884 kg (218 lb) (08/06 1515)  Intake/Output from previous day:  Intake/Output Summary (Last 24 hours) at 12/08/12 0834 Last data filed at 12/08/12 0816  Gross per 24 hour  Intake 3396.25 ml  Output   1965 ml  Net 1431.25 ml    Intake/Output this shift: Total I/O In: 240 [P.O.:240] Out: -   Labs:  Recent Labs  12/08/12 0511  HGB 10.1*    Recent Labs  12/08/12 0511  WBC 10.1  RBC 3.67*  HCT 31.9*  PLT 281    Recent Labs  12/08/12 0511  NA 136  K 3.9  CL 102  CO2 28  BUN 9  CREATININE 0.61  GLUCOSE 158*  CALCIUM 8.6   No results found for this basename: LABPT, INR,  in the last 72 hours  EXAM General - Patient is Alert, Appropriate and Oriented Extremity - Neurovascular intact Sensation intact distally Dorsiflexion/Plantar flexion intact Dressing - dressing C/D/I Motor Function - intact, moving foot and toes well on exam.    Past Medical History  Diagnosis Date  . Hypertension   . Arthritis   . Vertigo     hx post op  . Complication of anesthesia     terrible vertigo  . PONV (postoperative nausea and vomiting)     related to vertigo    Assessment/Plan: 1 Day Post-Op Procedure(s) (LRB):  TOTAL KNEE ARTHROPLASTY REVISION (Left) Principal Problem:   Failed total knee  arthroplasty  Estimated body mass index is 31.28 kg/(m^2) as calculated from the following:   Height as of this encounter: 5\' 10"  (1.778 m).   Weight as of this encounter: 98.884 kg (218 lb). Up with therapy Plan for discharge tomorrow Discharge home with home health Home CPM  DVT Prophylaxis - Xarelto Weight-Bearing as tolerated to left leg No vaccines. D/C O2 and Pulse OX and try on [address]  Patrica Duel 12/08/2012, 8:34 AM

## 2012-12-08 NOTE — Evaluation (Signed)
Physical Therapy Evaluation Patient Details Name: [name] [name] MRN: [id number] DOB: 21-Apr-1951 Today's Date: 12/08/2012 Time: 4696-2952 PT Time Calculation (min): 31 min  PT Assessment / Plan / Recommendation History of Present Illness  Patient is being admitted for left revision total knee arthroplasty vs left knee arthrotomy.  Clinical Impression  Pt s/p L TKR revision presents with decreased L LE strength/ROM and post op pain limiting functional mobility,  Pt should progress to d/c home with family assist and HHPT follow up    PT Assessment  Patient needs continued PT services    Follow Up Recommendations  Home health PT    Does the patient have the potential to tolerate intense rehabilitation      Barriers to Discharge        Equipment Recommendations  None recommended by PT    Recommendations for Other Services OT consult   Frequency 7X/week    Precautions / Restrictions Precautions Precautions: Knee Required Braces or Orthoses: Knee Immobilizer - Left Knee Immobilizer - Left: Discontinue once straight leg raise with < 10 degree lag Restrictions Weight Bearing Restrictions: No Other Position/Activity Restrictions: WBAT   Pertinent Vitals/Pain 3/10;  Premed, cold packs provided      Mobility  Bed Mobility Bed Mobility: Supine to Sit Supine to Sit: 4: Min assist Details for Bed Mobility Assistance: cues for sequence and use of R LE to self assist Transfers Transfers: Sit to Stand;Stand to Sit Sit to Stand: 4: Min assist;With upper extremity assist;From chair/3-in-1 Stand to Sit: 4: Min assist;With upper extremity assist;To chair/3-in-1 Details for Transfer Assistance: verbal cues for hand placement and L LE management Ambulation/Gait Ambulation/Gait Assistance: 4: Min assist Ambulation Distance (Feet): 54 Feet Assistive device: Rolling walker Ambulation/Gait Assistance Details: cues for posture, sequence and position from RW Gait Pattern: Step-to  pattern;Decreased step length - right;Decreased step length - left Gait velocity: ` Stairs: No    Exercises Total Joint Exercises Ankle Circles/Pumps: AROM;10 reps;Supine;Both Quad Sets: AROM;10 reps;Supine;Both Heel Slides: AAROM;10 reps;Supine;Left Straight Leg Raises: AAROM;Left;10 reps;Supine   PT Diagnosis: Difficulty walking  PT Problem List: Decreased strength;Decreased range of motion;Decreased activity tolerance;Decreased mobility;Decreased knowledge of use of DME;Pain;Decreased knowledge of precautions PT Treatment Interventions: DME instruction;Gait training;Stair training;Functional mobility training;Therapeutic activities;Therapeutic exercise;Patient/family education     PT Goals(Current goals can be found in the care plan section) Acute Rehab PT Goals Patient Stated Goal: to return to independence PT Goal Formulation: With patient Time For Goal Achievement: 12/15/12 Potential to Achieve Goals: Good  Visit Information  Last PT Received On: 12/08/12 Assistance Needed: +1 History of Present Illness: Patient is being admitted for left revision total knee arthroplasty vs left knee arthrotomy.       Prior Functioning  Home Living Family/patient expects to be discharged to:: Private residence Living Arrangements: Children Available Help at Discharge: Friend(s);Family Type of Home: House Home Access: Stairs to enter [employer] of Steps: 2 Entrance Stairs-Rails: Right;Left;Can reach both Home Layout: One level Home Equipment: Bedside commode;Shower seat;Adaptive equipment Adaptive Equipment: Reacher;Long-handled shoe horn Prior Function Level of Independence: Independent with assistive device(s) Communication Communication: No difficulties Dominant Hand: Right    Cognition  Cognition Arousal/Alertness: Awake/alert Behavior During Therapy: WFL for tasks assessed/performed Overall Cognitive Status: Within Functional Limits for tasks assessed     Extremity/Trunk Assessment Upper Extremity Assessment Upper Extremity Assessment: Overall WFL for tasks assessed Lower Extremity Assessment Lower Extremity Assessment: LLE deficits/detail LLE Deficits / Details: 2/5 quads with AAROM at knee -10 - 35   Balance Balance  Balance Assessed: Yes Dynamic Standing Balance Dynamic Standing - Level of Assistance: 4: Min assist  End of Session PT - End of Session Equipment Utilized During Treatment: Gait belt;Left knee immobilizer Activity Tolerance: Patient tolerated treatment well Patient left: in chair;with call bell/phone within reach Nurse Communication: Mobility status  GP     [name] [name] 12/08/2012, 1:09 PM

## 2012-12-08 NOTE — Care Management Note (Signed)
    Page 1 of 2   12/09/2012     12:40:35 PM   CARE MANAGEMENT NOTE 12/09/2012  Patient:  [name] [name]   Account Number:  [id number]  Date Initiated:  12/08/2012  Documentation initiated by:  Colleen Can  Subjective/Objective Assessment:   DX LEFT KNEE ARTHROFIBROSIS; LEFT KNEE REVISION    Pre-arranged with Genevieve Norlander for HHpt services that will start day after discharge.     Action/Plan:   CM spoke with patient. Plans are for her to return to her home in [facility] where she will have sisters who will be caregivers. She already has RW. Wants [facility] for HHpt services.   Anticipated DC Date:  12/09/2012   Anticipated DC Plan:  HOME W HOME HEALTH SERVICES      DC Planning Services  CM consult      [facility] Choice  HOME HEALTH  DURABLE MEDICAL EQUIPMENT   Choice offered to / List presented to:  C-1 Patient        HH arranged  HH-2 PT      [facility] agency  [facility]   Status of service:  Completed, signed off Medicare Important Message given?   (If response is "NO", the following Medicare IM given date fields will be blank) Date Medicare IM given:   Date Additional Medicare IM given:    Discharge Disposition:  HOME W HOME HEALTH SERVICES  Per UR Regulation:    If discussed at Long Length of Stay Meetings, dates discussed:    Comments:  12/08/2012 Colleen Can BSN RN CCM [phone] Orders for CPM. [facility]ev[facility] Norlander rep will order needed CPM.

## 2012-12-08 NOTE — Progress Notes (Signed)
Physical Therapy Treatment Patient Details Name: [name] [name] MRN: [id number] DOB: April 23, 1951 Today's Date: 12/08/2012 Time: 2440-1027 PT Time Calculation (min): 14 min  PT Assessment / Plan / Recommendation  History of Present Illness Patient is being admitted for left revision total knee arthroplasty vs left knee arthrotomy.   PT Comments     Follow Up Recommendations  Home health PT     Does the patient have the potential to tolerate intense rehabilitation     Barriers to Discharge        Equipment Recommendations  None recommended by PT    Recommendations for Other Services OT consult  Frequency 7X/week   Progress towards PT Goals Progress towards PT goals: Progressing toward goals  Plan Current plan remains appropriate    Precautions / Restrictions Precautions Precautions: Knee Required Braces or Orthoses: Knee Immobilizer - Left Knee Immobilizer - Left: Discontinue once straight leg raise with < 10 degree lag Restrictions Weight Bearing Restrictions: No Other Position/Activity Restrictions: WBAT   Pertinent Vitals/Pain 4/10; premed,    Mobility  Bed Mobility Bed Mobility: Sit to Supine Sit to Supine: 4: Min assist Details for Bed Mobility Assistance: cues for sequence and use of R LE to self assist Transfers Transfers: Sit to Stand;Stand to Sit Sit to Stand: With upper extremity assist;From chair/3-in-1;4: Min guard Stand to Sit: With upper extremity assist;To bed;4: Min guard Details for Transfer Assistance: verbal cues for hand placement and L LE management Ambulation/Gait Ambulation/Gait Assistance: 4: Min guard Ambulation Distance (Feet): 150 Feet Assistive device: Rolling walker Ambulation/Gait Assistance Details: cues for posture and position from RW Gait Pattern: Step-to pattern;Step-through pattern Stairs: No    Exercises     PT Diagnosis:    PT Problem List:   PT Treatment Interventions:     PT Goals (current goals can now be found in  the care plan section) Acute Rehab PT Goals Patient Stated Goal: to return to independence PT Goal Formulation: With patient Time For Goal Achievement: 12/15/12 Potential to Achieve Goals: Good  Visit Information  Last PT Received On: 12/08/12 Assistance Needed: +1 History of Present Illness: Patient is being admitted for left revision total knee arthroplasty vs left knee arthrotomy.    Subjective Data  Patient Stated Goal: to return to independence   Cognition  Cognition Arousal/Alertness: Awake/alert Behavior During Therapy: WFL for tasks assessed/performed Overall Cognitive Status: Within Functional Limits for tasks assessed    Balance  Balance Balance Assessed: Yes Dynamic Standing Balance Dynamic Standing - Level of Assistance: 4: Min assist  End of Session PT - End of Session Equipment Utilized During Treatment: Gait belt;Left knee immobilizer Activity Tolerance: Patient tolerated treatment well Patient left: in bed;with call bell/phone within reach Nurse Communication: Mobility status   GP     Giovoni Bunch 12/08/2012, 3:20 PM

## 2012-12-08 NOTE — Evaluation (Signed)
Occupational Therapy Evaluation Patient Details Name: [name] [name] MRN: [id number] DOB: [birth year] Today's Date: 12/08/2012 Time: 4098-1191 OT Time Calculation (min): 21 min  OT Assessment / Plan / Recommendation History of present illness Patient is being admitted for left revision total knee arthroplasty vs left knee arthrotomy.   Clinical Impression   Pt is currently doing well with functional mobility and ADL. Will see for OT to increase her independence with self care tasks for d/c home with family/friends.     OT Assessment  Patient needs continued OT Services    Follow Up Recommendations  No OT follow up;Supervision/Assistance - 24 hour    Barriers to Discharge      Equipment Recommendations  None recommended by OT    Recommendations for Other Services    Frequency  Min 2X/week    Precautions / Restrictions Precautions Precautions: Knee Required Braces or Orthoses: Knee Immobilizer - Left Knee Immobilizer - Left: Discontinue once straight leg raise with < 10 degree lag Restrictions Weight Bearing Restrictions: No Other Position/Activity Restrictions: WBAT   Pertinent Vitals/Pain Pt states pain is ok.    ADL  Eating/Feeding: Simulated;Independent Grooming: Performed;Wash/dry hands;Minimal assistance Where Assessed - Grooming: Unsupported standing Upper Body Bathing: Simulated;Chest;Right arm;Left arm;Abdomen;Set up Where Assessed - Upper Body Bathing: Unsupported sitting Lower Body Bathing: Simulated;Minimal assistance Where Assessed - Lower Body Bathing: Supported sit to stand Upper Body Dressing: Simulated;Set up Where Assessed - Upper Body Dressing: Unsupported sitting Lower Body Dressing: Simulated;Moderate assistance Where Assessed - Lower Body Dressing: Supported sit to Pharmacist, hospital: Performed;Minimal Web designer: Raised toilet seat with arms (or 3-in-1 over toilet) Toileting - Clothing Manipulation and Hygiene:  Performed;Minimal assistance Where Assessed - Engineer, mining and Hygiene: Sit to stand from 3-in-1 or toilet Equipment Used: Rolling walker ADL Comments: Pt states she used a Sports administrator after initial surgery for LB ADL. She needed cues for safety with RW especially to not pick up the walker off the floor and take several steps to turn around. She tries to take too large of a step to turn.  She needed cues for hand placement with transitions also.     OT Diagnosis: Generalized weakness  OT Problem List: Decreased strength;Decreased knowledge of use of DME or AE;Decreased safety awareness OT Treatment Interventions: Self-care/ADL training;DME and/or AE instruction;Therapeutic activities   OT Goals(Current goals can be found in the care plan section) Acute Rehab OT Goals Patient Stated Goal: to return to independence OT Goal Formulation: With patient Time For Goal Achievement: 12/15/12 Potential to Achieve Goals: Good  Visit Information  Last OT Received On: 12/08/12 Assistance Needed: +1 History of Present Illness: Patient is being admitted for left revision total knee arthroplasty vs left knee arthrotomy.       Prior Functioning     Home Living Family/patient expects to be discharged to:: Private residence Living Arrangements: Children Available Help at Discharge: Friend(s);Family Type of Home: House Home Equipment: Bedside commode;Shower seat;Adaptive equipment Adaptive Equipment: Reacher;Long-handled shoe horn Prior Function Level of Independence: Independent with assistive device(s) Communication Communication: No difficulties         Vision/Perception     Cognition  Cognition Arousal/Alertness: Awake/alert Behavior During Therapy: WFL for tasks assessed/performed Overall Cognitive Status: Within Functional Limits for tasks assessed    Extremity/Trunk Assessment Upper Extremity Assessment Upper Extremity Assessment: Overall WFL for tasks assessed      Mobility Transfers Transfers: Sit to Stand;Stand to Sit Sit to Stand: 4: Min assist;With upper extremity assist;From chair/3-in-1 Stand to  Sit: 4: Min assist;With upper extremity assist;To chair/3-in-1 Details for Transfer Assistance: verbal cues for hand placement and L LE management     Exercise     Balance Balance Balance Assessed: Yes Dynamic Standing Balance Dynamic Standing - Level of Assistance: 4: Min assist   End of Session OT - End of Session Activity Tolerance: Patient tolerated treatment well Patient left: in chair;with call bell/phone within reach;with family/visitor present  GO     Lennox Laity [phone] 12/08/2012, 1:07 PM

## 2012-12-09 DIAGNOSIS — D62 Acute posthemorrhagic anemia: Secondary | ICD-10-CM

## 2012-12-09 LAB — CBC
Hemoglobin: 9.5 g/dL — ABNORMAL LOW (ref 12.0–15.0)
MCH: 28.2 pg (ref 26.0–34.0)
MCHC: 32.5 g/dL (ref 30.0–36.0)
RDW: 13.9 % (ref 11.5–15.5)

## 2012-12-09 LAB — BASIC METABOLIC PANEL
Calcium: 8.8 mg/dL (ref 8.4–10.5)
GFR calc Af Amer: >90 mL/min (ref >90)
GFR calc non Af Amer: >90 mL/min (ref >90)
Glucose, Bld: 128 mg/dL — ABNORMAL HIGH (ref 70–99)
Potassium: 3.9 mEq/L (ref 3.5–5.1)
Sodium: 136 mEq/L (ref 135–145)

## 2012-12-09 MED ORDER — METHOCARBAMOL 500 MG PO TABS: 500.0000 mg | ORAL_TABLET | Freq: Four times a day (QID) | ORAL | Status: DC | PRN

## 2012-12-09 MED ORDER — HYDROMORPHONE HCL 2 MG PO TABS: 2.0000 mg | ORAL_TABLET | ORAL | Status: DC | PRN

## 2012-12-09 MED ORDER — TRAMADOL HCL 50 MG PO TABS: 50.0000 mg | ORAL_TABLET | Freq: Four times a day (QID) | ORAL | Status: DC | PRN

## 2012-12-09 MED ORDER — RIVAROXABAN 10 MG PO TABS: 10.0000 mg | ORAL_TABLET | Freq: Every day | ORAL | Status: DC

## 2012-12-09 NOTE — Progress Notes (Signed)
Occupational Therapy Treatment Patient Details Name: [name] [name] MRN: [id number] DOB: 1950/06/10 Today's Date: 12/09/2012 Time: 9562-1308 OT Time Calculation (min): 29 min  OT Assessment / Plan / Recommendation  History of present illness Patient is being admitted for left revision total knee arthroplasty vs left knee arthrotomy.   OT comments  Pt progressing well. She plans to sponge bathe initially at d/c due to her shower is not large enough to accomodate a shower chair. She is aware of when to d/c KI. She has some help at d/c and is aware of all OT education--verbalizes understanding of all.   Follow Up Recommendations  No OT follow up;Supervision/Assistance - 24 hour    Barriers to Discharge       Equipment Recommendations  None recommended by OT    Recommendations for Other Services    Frequency Min 2X/week   Progress towards OT Goals Progress towards OT goals: Progressing toward goals  Plan Discharge plan remains appropriate    Precautions / Restrictions Precautions Precautions: Knee Required Braces or Orthoses: Knee Immobilizer - Left Knee Immobilizer - Left: Discontinue once straight leg raise with < 10 degree lag Restrictions Weight Bearing Restrictions: No Other Position/Activity Restrictions: WBAT   Pertinent Vitals/Pain No c/o    ADL  Lower Body Dressing: Performed;Min guard (with reacher to don underwear and shorts) Assist with KI Where Assessed - Lower Body Dressing: Supported sit to Pharmacist, hospital: Performed;Min guard Statistician Method: Other (comment) (with walker) Toilet Transfer Equipment: Raised toilet seat with arms (or 3-in-1 over toilet) Toileting - Clothing Manipulation and Hygiene: Performed;Minimal assistance (to help manage KI as pants and underwear were tight under it) Where Assessed - Toileting Clothing Manipulation and Hygiene: Sit to stand from 3-in-1 or toilet Equipment Used: Knee Immobilizer;Rolling walker ADL Comments:  Discussed RW safety and positioning at sink. Pt trying to turn with walker not facing the sink to wash hands. Discussed facing the sink with RW and body toward the sink for safety and not twisting on knee. Pt also not backing all the way up to 3in1 before letting go of walker to reach for 3in1. Discussed making sure she feels surface on the back of her knees before letting go of walker. Pt askign about using loftstrand crutches that she had brought in. Informed PT. Pt states she will sponge bathe initially as she doesnt have room in her shower for a chair.     OT Diagnosis:    OT Problem List:   OT Treatment Interventions:     OT Goals(current goals can now be found in the care plan section) Acute Rehab OT Goals Potential to Achieve Goals: Good  Visit Information  Last OT Received On: 12/09/12 Assistance Needed: +1 History of Present Illness: Patient is being admitted for left revision total knee arthroplasty vs left knee arthrotomy.    Subjective Data      Prior Functioning       Cognition  Cognition Arousal/Alertness: Awake/alert Behavior During Therapy: WFL for tasks assessed/performed Overall Cognitive Status: Within Functional Limits for tasks assessed    Mobility  Bed Mobility Bed Mobility: Supine to Sit Supine to Sit: 4: Min assist;HOB elevated Transfers Transfers: Sit to Stand;Stand to Sit Sit to Stand: 4: Min guard;With upper extremity assist;From bed;From chair/3-in-1 Stand to Sit: 4: Min guard;With upper extremity assist;To chair/3-in-1 Details for Transfer Assistance: min verbal cues for hand placement    Exercises      Balance Balance Balance Assessed: Yes Dynamic Standing Balance Dynamic Standing -  Level of Assistance: 5: Stand by assistance   End of Session OT - End of Session Activity Tolerance: Patient tolerated treatment well Patient left: in chair;with call bell/phone within reach CPM Left Knee CPM Left Knee: Off  GO     Lennox Laity [phone] 12/09/2012, 9:36 AM

## 2012-12-09 NOTE — Progress Notes (Signed)
   Subjective: 2 Days Post-Op Procedure(s) (LRB):  TOTAL KNEE ARTHROPLASTY REVISION (Left) Patient reports pain as mild.   Patient seen in rounds with Dr. Lequita Halt. Patient is well, and has had no acute complaints or problems Patient is ready to go home  Objective: Vital signs in last 24 hours: Temp:  [97.3 F (36.3 C)-98.6 F (37 C)] 98 F (36.7 C) (08/08 0548) Pulse Rate:  [60-66] 60 (08/08 0548) Resp:  [15-16] 15 (08/08 0800) BP: (105-110)/(64-69) 105/69 mmHg (08/08 0548) SpO2:  [94 %-99 %] 95 % (08/08 0548)  Intake/Output from previous day:  Intake/Output Summary (Last 24 hours) at 12/09/12 1026 Last data filed at 12/09/12 1015  Gross per 24 hour  Intake    933 ml  Output   1550 ml  Net   -617 ml    Intake/Output this shift: Total I/O In: 240 [P.O.:240] Out: 250 [Urine:250]  Labs:  Recent Labs  12/08/12 0511 12/09/12 0440  HGB 10.1* 9.5*    Recent Labs  12/08/12 0511 12/09/12 0440  WBC 10.1 12.8*  RBC 3.67* 3.37*  HCT 31.9* 29.2*  PLT 281 241    Recent Labs  12/08/12 0511 12/09/12 0440  NA 136 136  K 3.9 3.9  CL 102 102  CO2 28 30  BUN 9 13  CREATININE 0.61 0.64  GLUCOSE 158* 128*  CALCIUM 8.6 8.8   No results found for this basename: LABPT, INR,  in the last 72 hours  EXAM: General - Patient is Alert, Appropriate and Oriented Extremity - Neurovascular intact Sensation intact distally Dorsiflexion/Plantar flexion intact No cellulitis present Incision - clean, dry, no drainage, healing Motor Function - intact, moving foot and toes well on exam.   Assessment/Plan: 2 Days Post-Op Procedure(s) (LRB):  TOTAL KNEE ARTHROPLASTY REVISION (Left) Procedure(s) (LRB):  TOTAL KNEE ARTHROPLASTY REVISION (Left) Past Medical History  Diagnosis Date  . Hypertension   . Arthritis   . Vertigo     hx post op  . Complication of anesthesia     terrible vertigo  . PONV (postoperative nausea and vomiting)     related to vertigo   Principal  Problem:   Failed total knee arthroplasty Active Problems:   Postoperative anemia due to acute blood loss  Estimated body mass index is 31.28 kg/(m^2) as calculated from the following:   Height as of this encounter: 5\' 10"  (1.778 m).   Weight as of this encounter: 98.884 kg (218 lb). Discharge home with home health Diet - Cardiac diet Follow up - in 2 weeks Activity - WBAT Disposition - Home Condition Upon Discharge - Good D/C Meds - See DC Summary DVT Prophylaxis - Xarelto  Otoniel Myhand 12/09/2012, 10:26 AM

## 2012-12-09 NOTE — Progress Notes (Signed)
Physical Therapy Treatment Patient Details Name: [name] [name] MRN: [id number] DOB: [birth year] Today's Date: 12/09/2012 Time: 0921-1006 PT Time Calculation (min): 45 min  PT Assessment / Plan / Recommendation  History of Present Illness Patient is being admitted for left revision total knee arthroplasty vs left knee arthrotomy.   PT Comments   Pt doing well   Follow Up Recommendations  Home health PT     Does the patient have the potential to tolerate intense rehabilitation     Barriers to Discharge        Equipment Recommendations  None recommended by PT    Recommendations for Other Services    Frequency     Progress towards PT Goals Progress towards PT goals: Progressing toward goals  Plan Current plan remains appropriate    Precautions / Restrictions Precautions Precautions: Knee Required Braces or Orthoses: Knee Immobilizer - Left Knee Immobilizer - Left: Discontinue once straight leg raise with < 10 degree lag Restrictions Weight Bearing Restrictions: No Other Position/Activity Restrictions: WBAT   Pertinent Vitals/Pain No c/o, premedicated    Mobility  Bed Mobility Bed Mobility: Not assessed Supine to Sit: 4: Min assist;HOB elevated Transfers Transfers: Sit to Stand;Stand to Sit Sit to Stand: 5: Supervision Stand to Sit: 5: Supervision Details for Transfer Assistance: incr time Ambulation/Gait Ambulation/Gait Assistance: 5: Supervision;6: Modified independent (Device/Increase time) Ambulation Distance (Feet): 320 Feet Assistive device: Lofstrands Ambulation/Gait Assistance Details: occasional cues for sequence Gait Pattern: Step-to pattern;Step-through pattern Stairs: Yes Stairs Assistance: 4: Min guard Stair Management Technique: One rail Right;With crutches Number of Stairs: 4    Exercises Total Joint Exercises Ankle Circles/Pumps: AROM;10 reps;Both Quad Sets: AROM;Both;10 reps Heel Slides: AAROM;10 reps;Left Straight Leg Raises: AAROM;Left;10  reps AAROM 5-45 degrees   PT Diagnosis:    PT Problem List:   PT Treatment Interventions:     PT Goals (current goals can now be found in the care plan section) Acute Rehab PT Goals Time For Goal Achievement: 12/15/12 Potential to Achieve Goals: Good  Visit Information  Last PT Received On: 12/09/12 Assistance Needed: +1 History of Present Illness: Patient is being admitted for left revision total knee arthroplasty vs left knee arthrotomy.    Subjective Data  Subjective: just stinging   Cognition  Cognition Arousal/Alertness: Awake/alert Behavior During Therapy: WFL for tasks assessed/performed Overall Cognitive Status: Within Functional Limits for tasks assessed    Balance  Balance Balance Assessed: Yes Dynamic Standing Balance Dynamic Standing - Level of Assistance: 5: Stand by assistance  End of Session PT - End of Session Equipment Utilized During Treatment: Gait belt;Left knee immobilizer Activity Tolerance: Patient tolerated treatment well Patient left: in chair;with call bell/phone within reach CPM Left Knee CPM Left Knee: Off   GP     [facility] 12/09/2012, 10:17 AM

## 2012-12-09 NOTE — Discharge Instructions (Signed)
° °Dr. Frank Aluisio °Total Joint Specialist °[facility] °[address] °[place], [place] [address] °[phone] ° °TOTAL KNEE REPLACEMENT POSTOPERATIVE DIRECTIONS ° ° ° °Knee Rehabilitation, Guidelines Following Surgery  °Results after knee surgery are often greatly improved when you follow the exercise, range of motion and muscle strengthening exercises prescribed by your doctor. Safety measures are also important to protect the knee from further injury. Any time any of these exercises cause you to have increased pain or swelling in your knee joint, decrease the amount until you are comfortable again and slowly increase them. If you have problems or questions, call your caregiver or physical therapist for advice.  ° °HOME CARE INSTRUCTIONS  °Remove items at home which could result in a fall. This includes throw rugs or furniture in walking pathways.  °Continue medications as instructed at time of discharge. °You may have some home medications which will be placed on hold until you complete the course of blood thinner medication.  °You may start showering once you are discharged home but do not submerge the incision under water. Just pat the incision dry and apply a dry gauze dressing on daily. °Walk with walker as instructed.  °You may resume a sexual relationship in one month or when given the OK by  your doctor.  °· Use walker as long as suggested by your caregivers. °· Avoid periods of inactivity such as sitting longer than an hour when not asleep. This helps prevent blood clots.  °You may put full weight on your legs and walk as much as is comfortable.  °You may return to work once you are cleared by your doctor.  °Do not drive a car for 6 weeks or until released by you surgeon.  °· Do not drive while taking narcotics.  °Wear the elastic stockings for three weeks following surgery during the day but you may remove then at night. °Make sure you keep all of your appointments after your  operation with all of your doctors and caregivers. You should call the office at the above phone number and make an appointment for approximately two weeks after the date of your surgery. °Change the dressing daily and reapply a dry dressing each time. °Please pick up a stool softener and laxative for home use as long as you are requiring pain medications. °· Continue to use ice on the knee for pain and swelling from surgery. You may notice swelling that will progress down to the foot and ankle.  This is normal after surgery.  Elevate the leg when you are not up walking on it.   °It is important for you to complete the blood thinner medication as prescribed by your doctor. °· Continue to use the breathing machine which will help keep your temperature down.  It is common for your temperature to cycle up and down following surgery, especially at night when you are not up moving around and exerting yourself.  The breathing machine keeps your lungs expanded and your temperature down. ° °RANGE OF MOTION AND STRENGTHENING EXERCISES  °Rehabilitation of the knee is important following a knee injury or an operation. After just a few days of immobilization, the muscles of the thigh which control the knee become weakened and shrink (atrophy). Knee exercises are designed to build up the tone and strength of the thigh muscles and to improve knee motion. Often times heat used for twenty to thirty minutes before working out will loosen up your tissues and help with improving the   range of motion but do not use heat for the first two weeks following surgery. These exercises can be done on a training (exercise) mat, on the floor, on a table or on a bed. Use what ever works the best and is most comfortable for you Knee exercises include:  °Leg Lifts - While your knee is still immobilized in a splint or cast, you can do straight leg raises. Lift the leg to 60 degrees, hold for 3 sec, and slowly lower the leg. Repeat 10-20 times 2-3  times daily. Perform this exercise against resistance later as your knee gets better.  °Quad and Hamstring Sets - Tighten up the muscle on the front of the thigh (Quad) and hold for 5-10 sec. Repeat this 10-20 times hourly. Hamstring sets are done by pushing the foot backward against an object and holding for 5-10 sec. Repeat as with quad sets.  °A rehabilitation program following serious knee injuries can speed recovery and prevent re-injury in the future due to weakened muscles. Contact your doctor or a physical therapist for more information on knee rehabilitation.  ° °SKILLED REHAB INSTRUCTIONS: °If the patient is transferred to a skilled rehab facility following release from the hospital, a list of the current medications will be sent to the facility for the patient to continue.  When discharged from the skilled rehab facility, please have the facility set up the patient's Home Health Physical Therapy prior to being released. Also, the skilled facility will be responsible for providing the patient with their medications at time of release from the facility to include their pain medication, the muscle relaxants, and their blood thinner medication. If the patient is still at the rehab facility at time of the two week follow up appointment, the skilled rehab facility will also need to assist the patient in arranging follow up appointment in our office and any transportation needs. ° °MAKE SURE YOU:  °Understand these instructions.  °Will watch your condition.  °Will get help right away if you are not doing well or get worse.  ° ° °Pick up stool softner and laxative for home. °Do not submerge incision under water. °May shower. °Continue to use ice for pain and swelling from surgery. ° °Take Xarelto for two and a half more weeks, then discontinue Xarelto. °Once the patient has completed the blood thinner regimen, then take a Baby 81 mg Aspirin daily for four more weeks. ° ° ° ° ° ° ° ° ° °

## 2012-12-09 NOTE — Discharge Summary (Signed)
Physician Discharge Summary   Patient ID: [name] [name] MRN: [id number] DOB/AGE: 10-16-1950 62 y.o.  Admit date: 12/07/2012 Discharge date: 12/09/2012  Primary Diagnosis:  Arthrofibrosis, left knee.  Admission Diagnoses:  Past Medical History  Diagnosis Date  . Hypertension   . Arthritis   . Vertigo     hx post op  . Complication of anesthesia     terrible vertigo  . PONV (postoperative nausea and vomiting)     related to vertigo   Discharge Diagnoses:   Principal Problem:   Failed total knee arthroplasty Active Problems:   Postoperative anemia due to acute blood loss  Estimated body mass index is 31.28 kg/(m^2) as calculated from the following:   Height as of this encounter: 5\' 10"  (1.778 m).   Weight as of this encounter: 98.884 kg (218 lb).  Procedure:  Procedure(s) (LRB):  TOTAL KNEE ARTHROPLASTY REVISION (Left)   Consults: None  HPI: [name] [name] is a 62 year old female, had a left  total knee arthroplasty done in 2013. She has had persistent pain and  severe stiffness with only approximately 25-30 degrees of flexion. I  saw her in second opinion and was concerned that she had excess scar  tissue formation and also had possibly loose femoral component. This  was a press fit porous-coated femoral component, and there was a lucency  around the trochlear flange. Given the significant stiffness and pain,  I felt that she may have loosened this component up. She presents now  for arthrotomy with scar excision and possible revision if the component  is loose.  Laboratory Data: Admission on 12/07/2012, Discharged on 12/09/2012  Component Date Value Range Status  . ABO/RH(D) 12/07/2012 O POS   Final  . Antibody Screen 12/07/2012 NEG   Final  . Sample Expiration 12/07/2012 12/10/2012   Final  . ABO/RH(D) 12/07/2012 O POS   Final  . WBC 12/08/2012 10.1  4.0 - 10.5 K/uL Final  . RBC 12/08/2012 3.67* 3.87 - 5.11 MIL/uL Final  . Hemoglobin 12/08/2012 10.1* 12.0 - 15.0  g/dL Final  . HCT 40/98/1191 31.9* 36.0 - 46.0 % Final  . MCV 12/08/2012 86.9  78.0 - 100.0 fL Final  . MCH 12/08/2012 27.5  26.0 - 34.0 pg Final  . MCHC 12/08/2012 31.7  30.0 - 36.0 g/dL Final  . RDW 47/82/9562 13.7  11.5 - 15.5 % Final  . Platelets 12/08/2012 281  150 - 400 K/uL Final  . Sodium 12/08/2012 136  135 - 145 mEq/L Final  . Potassium 12/08/2012 3.9  3.5 - 5.1 mEq/L Final  . Chloride 12/08/2012 102  96 - 112 mEq/L Final  . CO2 12/08/2012 28  19 - 32 mEq/L Final  . Glucose, Bld 12/08/2012 158* 70 - 99 mg/dL Final  . BUN 13/12/6576 9  6 - 23 mg/dL Final  . Creatinine, Ser 12/08/2012 0.61  0.50 - 1.10 mg/dL Final  . Calcium 46/96/2952 8.6  8.4 - 10.5 mg/dL Final  . GFR calc non Af Amer 12/08/2012 >90  >90 mL/min Final  . GFR calc Af Amer 12/08/2012 >90  >90 mL/min Final   Comment:                                 The eGFR has been calculated                          using the CKD  EPI equation.                          This calculation has not been                          validated in all clinical                          situations.                          eGFR's persistently                          <90 mL/min signify                          possible Chronic Kidney Disease.  . WBC 12/09/2012 12.8* 4.0 - 10.5 K/uL Final  . RBC 12/09/2012 3.37* 3.87 - 5.11 MIL/uL Final  . Hemoglobin 12/09/2012 9.5* 12.0 - 15.0 g/dL Final  . HCT 16/02/9603 29.2* 36.0 - 46.0 % Final  . MCV 12/09/2012 86.6  78.0 - 100.0 fL Final  . MCH 12/09/2012 28.2  26.0 - 34.0 pg Final  . MCHC 12/09/2012 32.5  30.0 - 36.0 g/dL Final  . RDW 54/01/8118 13.9  11.5 - 15.5 % Final  . Platelets 12/09/2012 241  150 - 400 K/uL Final  . Sodium 12/09/2012 136  135 - 145 mEq/L Final  . Potassium 12/09/2012 3.9  3.5 - 5.1 mEq/L Final  . Chloride 12/09/2012 102  96 - 112 mEq/L Final  . CO2 12/09/2012 30  19 - 32 mEq/L Final  . Glucose, Bld 12/09/2012 128* 70 - 99 mg/dL Final  . BUN 14/78/2956 13  6 - 23 mg/dL  Final  . Creatinine, Ser 12/09/2012 0.64  0.50 - 1.10 mg/dL Final  . Calcium 21/30/8657 8.8  8.4 - 10.5 mg/dL Final  . GFR calc non Af Amer 12/09/2012 >90  >90 mL/min Final  . GFR calc Af Amer 12/09/2012 >90  >90 mL/min Final   Comment:                                 The eGFR has been calculated                          using the CKD EPI equation.                          This calculation has not been                          validated in all clinical                          situations.                          eGFR's persistently                          <90 mL/min signify  possible Chronic Kidney Disease.  Hospital Outpatient Visit on 12/01/2012  Component Date Value Range Status  . aPTT 12/01/2012 28  24 - 37 seconds Final  . Prothrombin Time 12/01/2012 13.1  11.6 - 15.2 seconds Final  . INR 12/01/2012 1.01  0.00 - 1.49 Final  . Color, Urine 12/01/2012 YELLOW  YELLOW Final  . APPearance 12/01/2012 CLEAR  CLEAR Final  . Specific Gravity, Urine 12/01/2012 1.021  1.005 - 1.030 Final  . pH 12/01/2012 6.0  5.0 - 8.0 Final  . Glucose, UA 12/01/2012 NEGATIVE  NEGATIVE mg/dL Final  . Hgb urine dipstick 12/01/2012 NEGATIVE  NEGATIVE Final  . Bilirubin Urine 12/01/2012 NEGATIVE  NEGATIVE Final  . Ketones, ur 12/01/2012 NEGATIVE  NEGATIVE mg/dL Final  . Protein, ur 29/52/8413 NEGATIVE  NEGATIVE mg/dL Final  . Urobilinogen, UA 12/01/2012 1.0  0.0 - 1.0 mg/dL Final  . Nitrite 24/40/1027 NEGATIVE  NEGATIVE Final  . Leukocytes, UA 12/01/2012 SMALL* NEGATIVE Final  . MRSA, PCR 12/01/2012 NEGATIVE  NEGATIVE Final  . Staphylococcus aureus 12/01/2012 NEGATIVE  NEGATIVE Final   Comment:                                 The Xpert SA Assay (FDA                          approved for NASAL specimens                          in patients over 77 years of age),                          is one component of                          a comprehensive surveillance                           program.  Test performance has                          been validated by [employer] for patients greater                          than or equal to 25 year old.                          It is not intended                          to diagnose infection nor to                          guide or monitor treatment.  . Squamous Epithelial / LPF 12/01/2012 RARE  RARE Final  . WBC, UA 12/01/2012 0-2  <3 WBC/hpf Final  . Bacteria, UA 12/01/2012 FEW* RARE Final  . Urine-Other 12/01/2012 MUCOUS PRESENT   Final     X-Rays:Dg Chest 2 View  12/01/2012   *  RADIOLOGY REPORT*  Clinical Data: Preop for left knee revision  CHEST - 2 VIEW  Comparison: None.  Findings: Cardiomediastinal silhouette is unremarkable. Degenerative changes mid and lower thoracic spine.  No acute infiltrate or pulmonary edema.  Metallic fixation plate noted cervical spine.  IMPRESSION: No active disease.  Degenerative changes thoracic spine.   Original Report Authenticated By: [name] Mead, M.D.    EKG:No orders found for this or any previous visit.   Hospital Course: [name] [name] is a 62 y.o. who was admitted to [facility]. They were brought to the operating room on 12/07/2012 and underwent Procedure(s):  TOTAL KNEE ARTHROPLASTY REVISION.  Patient tolerated the procedure well and was later transferred to the recovery room and then to the orthopaedic floor for postoperative care.  They were given PO and IV analgesics for pain control following their surgery.  They were given 24 hours of postoperative antibiotics of  Anti-infectives   Start     Dose/Rate Route Frequency Ordered Stop   12/07/12 1800  ceFAZolin (ANCEF) IVPB 1 g/50 mL premix     1 g 100 mL/hr over 30 Minutes Intravenous Every 6 hours 12/07/12 1542 12/08/12 0129   12/07/12 0815  ceFAZolin (ANCEF) IVPB 2 g/50 mL premix     2 g 100 mL/hr over 30 Minutes Intravenous On call to O.R. 12/07/12 0803 12/07/12 1116     and started on DVT  prophylaxis in the form of Xarelto.   PT and OT were ordered for total joint protocol.  Discharge planning consulted to help with postop disposition and equipment needs.  Patient had a decent night on the evening of surgery.  They started to get up OOB with therapy on day one.  Continued to work with therapy into day two.  Dressing was changed on day two and the incision was healing well. Patient was seen in rounds and was ready to go home.   Discharge Medications: Prior to Admission medications   Medication Sig Start Date End Date Taking? Authorizing Provider  acetaminophen (TYLENOL) 500 MG tablet Take 500 mg by mouth every 6 (six) hours as needed for pain.   Yes Historical Provider, MD  hydrochlorothiazide (HYDRODIURIL) 25 MG tablet Take 25 mg by mouth every morning.   Yes Historical Provider, MD  HYDROmorphone (DILAUDID) 2 MG tablet Take 1-2 tablets (2-4 mg total) by mouth every 4 (four) hours as needed. 12/09/12   Paxson Harrower Julien Girt, PA-C  meclizine (ANTIVERT) 25 MG tablet Take 25 mg by mouth 3 (three) times daily as needed for nausea.   Yes Historical Provider, MD  methocarbamol (ROBAXIN) 500 MG tablet Take 1 tablet (500 mg total) by mouth every 6 (six) hours as needed. 12/09/12   Hesham Womac Julien Girt, PA-C  rivaroxaban (XARELTO) 10 MG TABS tablet Take 1 tablet (10 mg total) by mouth daily with breakfast. Take Xarelto for two and a half more weeks, then discontinue Xarelto. Once the patient has completed the blood thinner regimen, then take a Baby 81 mg Aspirin daily for four more weeks. 12/09/12   Ella Golomb, PA-C  traMADol (ULTRAM) 50 MG tablet Take 1-2 tablets (50-100 mg total) by mouth every 6 (six) hours as needed (mild pain). 12/09/12   Mckynzie Liwanag Julien Girt, PA-C    Diet: Cardiac diet Activity:WBAT Follow-up:in 2 weeks Disposition - Home Discharged Condition: good       Discharge Orders   Future Orders Complete By Expires   Call MD / Call 911  As directed    Comments:  If  you experience chest pain or shortness of breath, CALL 911 and be transported to the hospital emergency room.  If you develope a fever above 101 F, pus (white drainage) or increased drainage or redness at the wound, or calf pain, call your surgeon's office.   Change dressing  As directed    Comments:     Change dressing daily with sterile 4 x 4 inch gauze dressing and apply TED hose. Do not submerge the incision under water.   Constipation Prevention  As directed    Comments:     Drink plenty of fluids.  Prune juice may be helpful.  You may use a stool softener, such as Colace (over the counter) 100 mg twice a day.  Use MiraLax (over the counter) for constipation as needed.   CPM  As directed    Comments:     Continuous passive motion machine (CPM):      Use the CPM from 0 degrees to 40 degrees for a total of four hours per day.      You may increase by 5 degrees per day.  You may break it up into 2 or 3 sessions per day.      Use CPM for 2-3 weeks or until you are told to stop.   Diet - low sodium heart healthy  As directed    Discharge instructions  As directed    Comments:     Pick up stool softner and laxative for home. Do not submerge incision under water. May shower. Continue to use ice for pain and swelling from surgery.  Take Xarelto for two and a half more weeks, then discontinue Xarelto. Once the patient has completed the blood thinner regimen, then take a Baby 81 mg Aspirin daily for four more weeks.   Do not put a pillow under the knee. Place it under the heel.  As directed    Do not sit on low chairs, stoools or toilet seats, as it may be difficult to get up from low surfaces  As directed    Driving restrictions  As directed    Comments:     No driving until released by the physician.   Increase activity slowly as tolerated  As directed    Lifting restrictions  As directed    Comments:     No lifting until released by the physician.   Patient may shower  As directed     Comments:     You may shower without a dressing once there is no drainage.  Do not wash over the wound.  If drainage remains, do not shower until drainage stops.   TED hose  As directed    Comments:     Use stockings (TED hose) for 3 weeks on both leg(s).  You may remove them at night for sleeping.   Weight bearing as tolerated  As directed        Medication List    STOP taking these medications       ibuprofen 200 MG tablet  Commonly known as:  ADVIL,MOTRIN      TAKE these medications       acetaminophen 500 MG tablet  Commonly known as:  TYLENOL  Take 500 mg by mouth every 6 (six) hours as needed for pain.     hydrochlorothiazide 25 MG tablet  Commonly known as:  HYDRODIURIL  Take 25 mg by mouth every morning.     HYDROmorphone 2 MG tablet  Commonly known  as:  DILAUDID  Take 1-2 tablets (2-4 mg total) by mouth every 4 (four) hours as needed.     meclizine 25 MG tablet  Commonly known as:  ANTIVERT  Take 25 mg by mouth 3 (three) times daily as needed for nausea.     methocarbamol 500 MG tablet  Commonly known as:  ROBAXIN  Take 1 tablet (500 mg total) by mouth every 6 (six) hours as needed.     rivaroxaban 10 MG Tabs tablet  Commonly known as:  XARELTO  - Take 1 tablet (10 mg total) by mouth daily with breakfast. Take Xarelto for two and a half more weeks, then discontinue Xarelto.  - Once the patient has completed the blood thinner regimen, then take a Baby 81 mg Aspirin daily for four more weeks.     traMADol 50 MG tablet  Commonly known as:  ULTRAM  Take 1-2 tablets (50-100 mg total) by mouth every 6 (six) hours as needed (mild pain).       Follow-up Information   Follow up with Loanne Drilling, MD. Schedule an appointment as soon as possible for a visit in 2 weeks. (Follow up on either Tuesday 8/19 or Thursday 8/21)    Specialty:  Orthopedic Surgery   Contact information:   [address] [place] [address] [phone]        Signed: Patrica Duel 12/15/2012, 9:41 AM

## 2012-12-23 ENCOUNTER — Emergency Department: Payer: Self-pay | Admitting: Emergency Medicine

## 2012-12-27 ENCOUNTER — Observation Stay: Payer: Self-pay | Admitting: Family Medicine

## 2012-12-27 LAB — COMPREHENSIVE METABOLIC PANEL
Albumin: 3.4 g/dL (ref 3.4–5.0)
Alkaline Phosphatase: 203 U/L — ABNORMAL HIGH (ref 50–136)
BUN: 10 mg/dL (ref 7–18)
Bilirubin,Total: 0.3 mg/dL (ref 0.2–1.0)
Chloride: 101 mmol/L (ref 98–107)
Creatinine: 0.74 mg/dL (ref 0.60–1.30)
EGFR (Non-African Amer.): >60
Glucose: 86 mg/dL (ref 65–99)
Potassium: 3.2 mmol/L — ABNORMAL LOW (ref 3.5–5.1)
SGOT(AST): 18 U/L (ref 15–37)
SGPT (ALT): 22 U/L (ref 12–78)
Sodium: 138 mmol/L (ref 136–145)
Total Protein: 7.6 g/dL (ref 6.4–8.2)

## 2012-12-27 LAB — CBC
HCT: 35.4 % (ref 35.0–47.0)
HGB: 12.1 g/dL (ref 12.0–16.0)
MCH: 28.5 pg (ref 26.0–34.0)
MCHC: 34.1 g/dL (ref 32.0–36.0)
WBC: 10.5 10*3/uL (ref 3.6–11.0)

## 2012-12-27 LAB — TROPONIN I
Troponin-I: <0.02 ng/mL
Troponin-I: <0.02 ng/mL

## 2012-12-27 LAB — CK TOTAL AND CKMB (NOT AT ARMC)
CK, Total: 60 U/L (ref 21–215)
CK-MB: <0.5 ng/mL — ABNORMAL LOW (ref 0.5–3.6)

## 2012-12-28 LAB — TSH: Thyroid Stimulating Horm: 1.06 u[IU]/mL

## 2012-12-28 LAB — BASIC METABOLIC PANEL
Anion Gap: 4 — ABNORMAL LOW (ref 7–16)
Chloride: 104 mmol/L (ref 98–107)
EGFR (Non-African Amer.): >60
Glucose: 105 mg/dL — ABNORMAL HIGH (ref 65–99)
Osmolality: 278 (ref 275–301)
Sodium: 140 mmol/L (ref 136–145)

## 2012-12-28 LAB — LIPID PANEL
Cholesterol: 109 mg/dL (ref 0–200)
HDL Cholesterol: 48 mg/dL (ref 40–60)
Ldl Cholesterol, Calc: 44 mg/dL (ref 0–100)
VLDL Cholesterol, Calc: 17 mg/dL (ref 5–40)

## 2012-12-30 ENCOUNTER — Encounter: Payer: Self-pay | Admitting: Orthopedic Surgery

## 2013-01-02 ENCOUNTER — Encounter: Payer: Self-pay | Admitting: Orthopedic Surgery

## 2013-02-01 ENCOUNTER — Encounter: Payer: Self-pay | Admitting: Orthopedic Surgery

## 2013-02-13 ENCOUNTER — Encounter (HOSPITAL_COMMUNITY): Payer: Self-pay | Admitting: Pharmacy Technician

## 2013-02-13 ENCOUNTER — Encounter (HOSPITAL_COMMUNITY)
Admission: RE | Admit: 2013-02-13 | Discharge: 2013-02-13 | Disposition: A | Discharge status: Home / Self Care | Payer: No Typology Code available for payment source | Source: Ambulatory Visit | Attending: Orthopedic Surgery | Admitting: Orthopedic Surgery

## 2013-02-13 ENCOUNTER — Encounter (HOSPITAL_COMMUNITY): Payer: Self-pay

## 2013-02-13 LAB — BASIC METABOLIC PANEL
BUN: 12 mg/dL (ref 6–23)
Chloride: 101 mEq/L (ref 96–112)
Creatinine, Ser: 0.65 mg/dL (ref 0.50–1.10)
GFR calc Af Amer: >90 mL/min (ref >90)
GFR calc non Af Amer: >90 mL/min (ref >90)
Potassium: 3.6 mEq/L (ref 3.5–5.1)

## 2013-02-13 LAB — CBC
Hemoglobin: 12.6 g/dL (ref 12.0–15.0)
MCHC: 32.2 g/dL (ref 30.0–36.0)
RDW: 13.6 % (ref 11.5–15.5)
WBC: 9.2 10*3/uL (ref 4.0–10.5)

## 2013-02-13 NOTE — Progress Notes (Addendum)
CHEST XRAY 12-01-2012 EPIC EKG 11-22-12 [facility] ON CHART

## 2013-02-13 NOTE — Patient Instructions (Addendum)
19 [name] [name]  02/13/2013   Your procedure is scheduled on: Wednesday 02-15-2013  Report to [facility] at 315 PM  Call this number if you have problems the morning of surgery [phone]   Remember:   Do not eat food  :After Midnight.   CLEAR LIQUIDS MIDNIGHT UNTIL 1145 AM, THEN NOTHING BY MOUTH.   Take these medicines the morning of surgery with A SIP OF WATER: tylenol if needed                                SEE [place] PREPARING FOR SURGERY SHEET             You may not have any metal on your body including hair pins and piercings  Do not wear jewelry, make-up.  Do not wear lotions, powders, or perfumes. You may wear deodorant.   Men may shave face and neck.  Do not bring valuables to the hospital.  IS NOT RESPONSIBLE FOR VALUEABLES.  Contacts, dentures or bridgework may not be worn into surgery.  Leave suitcase in the car. After surgery it may be brought to your room.  For patients admitted to the hospital, checkout time is 11:00 AM the day of discharge.   Patients discharged the day of surgery will not be allowed to drive home.  Name and phone number of your driver:  Special Instructions: N/A   Please read over the following fact sheets that you were given:   Call Cain Sieve RN pre op nurse if needed [phone]    FAILURE TO FOLLOW THESE INSTRUCTIONS MAY RESULT IN THE CANCELLATION OF YOUR SURGERY.  PATIENT SIGNATURE___________________________________________  NURSE SIGNATURE_____________________________________________

## 2013-02-14 ENCOUNTER — Other Ambulatory Visit: Payer: Self-pay | Admitting: Orthopedic Surgery

## 2013-02-15 ENCOUNTER — Encounter (HOSPITAL_COMMUNITY): Payer: Self-pay | Admitting: *Deleted

## 2013-02-15 ENCOUNTER — Ambulatory Visit (HOSPITAL_COMMUNITY)
Admission: RE | Admit: 2013-02-15 | Discharge: 2013-02-15 | Disposition: A | Discharge status: Home / Self Care | Payer: No Typology Code available for payment source | Source: Ambulatory Visit | Attending: Orthopedic Surgery | Admitting: Orthopedic Surgery

## 2013-02-15 ENCOUNTER — Encounter (HOSPITAL_COMMUNITY)
Admission: RE | Disposition: A | Discharge status: Home / Self Care | Payer: Self-pay | Source: Ambulatory Visit | Attending: Orthopedic Surgery

## 2013-02-15 ENCOUNTER — Encounter (HOSPITAL_COMMUNITY): Payer: No Typology Code available for payment source | Admitting: Anesthesiology

## 2013-02-15 ENCOUNTER — Ambulatory Visit (HOSPITAL_COMMUNITY): Payer: No Typology Code available for payment source | Admitting: Anesthesiology

## 2013-02-15 DIAGNOSIS — I10 Essential (primary) hypertension: Secondary | ICD-10-CM | POA: Insufficient documentation

## 2013-02-15 DIAGNOSIS — M24669 Ankylosis, unspecified knee: Secondary | ICD-10-CM | POA: Insufficient documentation

## 2013-02-15 DIAGNOSIS — Z79899 Other long term (current) drug therapy: Secondary | ICD-10-CM | POA: Insufficient documentation

## 2013-02-15 DIAGNOSIS — M25669 Stiffness of unspecified knee, not elsewhere classified: Secondary | ICD-10-CM

## 2013-02-15 DIAGNOSIS — Z96659 Presence of unspecified artificial knee joint: Secondary | ICD-10-CM

## 2013-02-15 HISTORY — PX: KNEE CLOSED REDUCTION: SHX995

## 2013-02-15 SURGERY — MANIPULATION, KNEE, CLOSED
Anesthesia: General | Site: Knee | Laterality: Left | Wound class: Clean

## 2013-02-15 MED ORDER — TRAMADOL HCL 50 MG PO TABS: 50.0000 mg | ORAL_TABLET | Freq: Four times a day (QID) | ORAL | Status: DC | PRN

## 2013-02-15 MED ORDER — KETOROLAC TROMETHAMINE 30 MG/ML IJ SOLN
INTRAMUSCULAR | Status: AC
  Filled 2013-02-15: qty 1

## 2013-02-15 MED ORDER — LACTATED RINGERS IV SOLN: INTRAVENOUS | Status: DC

## 2013-02-15 MED ORDER — CHLORHEXIDINE GLUCONATE 4 % EX LIQD: 60.0000 mL | Freq: Once | CUTANEOUS | Status: DC

## 2013-02-15 MED ORDER — SCOPOLAMINE 1 MG/3DAYS TD PT72
MEDICATED_PATCH | TRANSDERMAL | Status: DC | PRN
  Administered 2013-02-15: 1 via TRANSDERMAL

## 2013-02-15 MED ORDER — LACTATED RINGERS IV SOLN
INTRAVENOUS | Status: DC | PRN
  Administered 2013-02-15: 18:00:00 via INTRAVENOUS

## 2013-02-15 MED ORDER — DEXAMETHASONE SODIUM PHOSPHATE 10 MG/ML IJ SOLN
INTRAMUSCULAR | Status: DC | PRN
  Administered 2013-02-15: 10 mg via INTRAVENOUS

## 2013-02-15 MED ORDER — ACETAMINOPHEN 10 MG/ML IV SOLN
1000.0000 mg | Freq: Once | INTRAVENOUS | Status: DC
  Filled 2013-02-15: qty 100

## 2013-02-15 MED ORDER — TRAMADOL HCL 50 MG PO TABS
50.0000 mg | ORAL_TABLET | Freq: Four times a day (QID) | ORAL | Status: AC | PRN
  Administered 2013-02-15: 50 mg via ORAL
  Filled 2013-02-15: qty 1

## 2013-02-15 MED ORDER — SODIUM CHLORIDE 0.9 % IV SOLN: INTRAVENOUS | Status: DC

## 2013-02-15 MED ORDER — PROPOFOL 10 MG/ML IV BOLUS
INTRAVENOUS | Status: DC | PRN
  Administered 2013-02-15: 200 mg via INTRAVENOUS

## 2013-02-15 MED ORDER — CEFAZOLIN SODIUM-DEXTROSE 2-3 GM-% IV SOLR
2.0000 g | INTRAVENOUS | Status: AC
  Administered 2013-02-15: 2 g via INTRAVENOUS

## 2013-02-15 MED ORDER — PROMETHAZINE HCL 25 MG/ML IJ SOLN: 6.2500 mg | INTRAMUSCULAR | Status: DC | PRN

## 2013-02-15 MED ORDER — ACETAMINOPHEN 10 MG/ML IV SOLN
INTRAVENOUS | Status: DC | PRN
  Administered 2013-02-15: 1000 mg via INTRAVENOUS

## 2013-02-15 MED ORDER — KETOROLAC TROMETHAMINE 30 MG/ML IJ SOLN
15.0000 mg | Freq: Once | INTRAMUSCULAR | Status: AC | PRN
  Administered 2013-02-15: 15 mg via INTRAVENOUS

## 2013-02-15 MED ORDER — SCOPOLAMINE 1 MG/3DAYS TD PT72
MEDICATED_PATCH | TRANSDERMAL | Status: AC
  Filled 2013-02-15: qty 1

## 2013-02-15 MED ORDER — FENTANYL CITRATE 0.05 MG/ML IJ SOLN
25.0000 ug | INTRAMUSCULAR | Status: DC | PRN
  Administered 2013-02-15 (×2): 25 ug via INTRAVENOUS

## 2013-02-15 MED ORDER — FENTANYL CITRATE 0.05 MG/ML IJ SOLN
INTRAMUSCULAR | Status: AC
  Filled 2013-02-15: qty 2

## 2013-02-15 MED ORDER — ONDANSETRON HCL 4 MG/2ML IJ SOLN
INTRAMUSCULAR | Status: DC | PRN
  Administered 2013-02-15: 4 mg via INTRAMUSCULAR

## 2013-02-15 MED ORDER — MIDAZOLAM HCL 5 MG/5ML IJ SOLN
INTRAMUSCULAR | Status: DC | PRN
  Administered 2013-02-15: 2 mg via INTRAVENOUS

## 2013-02-15 MED ORDER — CEFAZOLIN SODIUM-DEXTROSE 2-3 GM-% IV SOLR
INTRAVENOUS | Status: AC
  Filled 2013-02-15: qty 50

## 2013-02-15 MED ORDER — FENTANYL CITRATE 0.05 MG/ML IJ SOLN
INTRAMUSCULAR | Status: DC | PRN
  Administered 2013-02-15 (×2): 50 ug via INTRAVENOUS

## 2013-02-15 MED ORDER — DEXAMETHASONE SODIUM PHOSPHATE 10 MG/ML IJ SOLN: 10.0000 mg | Freq: Once | INTRAMUSCULAR | Status: DC

## 2013-02-15 SURGICAL SUPPLY — 14 items
BANDAGE ADHESIVE 1X3 (GAUZE/BANDAGES/DRESSINGS) IMPLANT
CLOTH BEACON ORANGE TIMEOUT ST (SAFETY) ×2 IMPLANT
GLOVE BIO SURGEON STRL SZ7.5 (GLOVE) ×2 IMPLANT
GLOVE BIOGEL PI IND STRL 8 (GLOVE) ×1 IMPLANT
GLOVE BIOGEL PI INDICATOR 8 (GLOVE) ×1
GOWN PREVENTION PLUS LG XLONG (DISPOSABLE) ×2 IMPLANT
GOWN STRL REIN XL XLG (GOWN DISPOSABLE) ×2 IMPLANT
MANIFOLD NEPTUNE II (INSTRUMENTS) ×2 IMPLANT
NDL SAFETY ECLIPSE 18X1.5 (NEEDLE) IMPLANT
NEEDLE HYPO 18GX1.5 SHARP (NEEDLE)
POSITIONER SURGICAL ARM (MISCELLANEOUS) ×2 IMPLANT
SPONGE GAUZE 4X4 12PLY (GAUZE/BANDAGES/DRESSINGS) IMPLANT
SYR CONTROL 10ML LL (SYRINGE) IMPLANT
TOWEL OR 17X26 10 PK STRL BLUE (TOWEL DISPOSABLE) ×4 IMPLANT

## 2013-02-15 NOTE — Transfer of Care (Signed)
Immediate Anesthesia Transfer of Care Note  Patient: [name] [name]  Procedure(s) Performed: Procedure(s) (LRB): CLOSED MANIPULATION LEFT KNEE (Left)  Patient Location: PACU  Anesthesia Type: General  Level of Consciousness: sedated, patient cooperative and responds to stimulation  Airway & Oxygen Therapy: Patient Spontanous Breathing and Patient connected to face mask oxgen  Post-op Assessment: Report given to PACU RN and Post -op Vital signs reviewed and stable  Post vital signs: Reviewed and stable  Complications: No apparent anesthesia complications

## 2013-02-15 NOTE — Progress Notes (Signed)
Dr Lequita Halt aware pt may not have person to spend entire night with her.  MD reports case was of short duration and person staying night not necessary.

## 2013-02-15 NOTE — Op Note (Signed)
  OPERATIVE REPORT   PREOPERATIVE DIAGNOSIS: Arthrofibrosis, Left  knee.   POSTOPERATIVE DIAGNOSIS: Arthrofibrosis, Left knee.   PROCEDURE:  Left  knee closed manipulation.   SURGEON: Ollen Gross, MD   ASSISTANT: None.   ANESTHESIA: General.   COMPLICATIONS: None.   CONDITION: Stable to Recovery.   BRIEF CLINICAL NOTE- Ms. Perkovich is a 62 yo female post- left knee revision for loosening of her prosthesis and scar tissue who has developed arthrofibrosis with flexion limited to 75 degrees. She has had adequate PT and presents now for closed manipulation  Pre-manipulation range of motion is 10-75.  Post-manipulation range of  Motion is 5-110  PROCEDURE IN DETAIL: After successful administration of general  anesthetic, exam under anesthesia was performed showing range of motion  10-75 degrees. I then placed my chest against the proximal tibia,  flexing the knee with audible lysis of adhesions. I was easily able to  get the knee flexed to 110  degrees. I then put the knee back in extension and with some  patellar manipulation and gentle pressure got within 10 degrees of full  Extension.The patient was subsequently awakened and transported to Recovery in  stable condition.

## 2013-02-15 NOTE — H&P (Signed)
  CC- [name] [name] is a 62 y.o. female who presents with left knee stiffness.  HPI- . Knee Pain: Patient presents with stiffness involving the  left knee. Onset of the symptoms was several weeks ago. Inciting event: revision left TKA. Current symptoms include stiffness. She has had adequate PT and has arthrofibrosis with flexion limited to 75 degrees. She presents now for closed manipulation  Past Medical History  Diagnosis Date  . Hypertension   . Arthritis   . Vertigo     hx post op  . Complication of anesthesia     terrible vertigo  . PONV (postoperative nausea and vomiting)     related to vertigo    Past Surgical History  Procedure Laterality Date  . Back surgery  2010    cervical fusion  . Foot surgery Left     heel  . Joint replacement Left     knee  . Carpal tunnel release Right   . Breast surgery Left     biopsy  . Total knee revision Left 12/07/2012    Procedure:  TOTAL KNEE ARTHROPLASTY REVISION;  Surgeon: Loanne Drilling, MD;  Location: [facility];  Service: Orthopedics;  Laterality: Left;    Prior to Admission medications   Medication Sig Start Date End Date Taking? Authorizing Provider  acetaminophen (TYLENOL) 500 MG tablet Take 500 mg by mouth every 6 (six) hours as needed for pain.    Historical Provider, MD  hydrochlorothiazide (HYDRODIURIL) 25 MG tablet Take 25 mg by mouth every morning.    Historical Provider, MD  ibuprofen (ADVIL,MOTRIN) 200 MG tablet Take 200 mg by mouth every 6 (six) hours as needed for pain.    Historical Provider, MD  meclizine (ANTIVERT) 25 MG tablet Take 25 mg by mouth 3 (three) times daily as needed for nausea.    Historical Provider, MD  naproxen sodium (ANAPROX) 220 MG tablet Take 220 mg by mouth 2 (two) times daily as needed.    Historical Provider, MD  pregabalin (LYRICA) 75 MG capsule Take 75 mg by mouth 2 (two) times daily.    Historical Provider, MD  Vitamin D, Ergocalciferol, (DRISDOL) 50000 UNITS CAPS capsule Take 50,000 Units  by mouth every Saturday.    Historical Provider, MD   KNEE EXAM antalgic gait, ROM 10-75 degrees  Physical Examination: General appearance - alert, well appearing, and in no distress Mental status - alert, oriented to person, place, and time Chest - clear to auscultation, no wheezes, rales or rhonchi, symmetric air entry Heart - normal rate, regular rhythm, normal S1, S2, no murmurs, rubs, clicks or gallops Abdomen - soft, nontender, nondistended, no masses or organomegaly Neurological - alert, oriented, normal speech, no focal findings or movement disorder noted   Asessment/Plan--- Left knee arthrofibrosis - Plan left knee closed manipulation. Procedure risks and potential comps discussed with patient who elects to proceed. Goals are decreased pain and increased function with a high likelihood of achieving both

## 2013-02-15 NOTE — Preoperative (Signed)
Beta Blockers   Reason not to administer Beta Blockers:Not Applicable 

## 2013-02-15 NOTE — Interval H&P Note (Signed)
History and Physical Interval Note:  02/15/2013 6:27 PM  [name] [name]  has presented today for surgery, with the diagnosis of Arthrofibrosis of the Left Knee  The various methods of treatment have been discussed with the patient and family. After consideration of risks, benefits and other options for treatment, the patient has consented to  Procedure(s): CLOSED MANIPULATION LEFT KNEE (Left) as a surgical intervention .  The patient's history has been reviewed, patient examined, no change in status, stable for surgery.  I have reviewed the patient's chart and labs.  Questions were answered to the patient's satisfaction.     Loanne Drilling

## 2013-02-15 NOTE — Anesthesia Preprocedure Evaluation (Signed)
Anesthesia Evaluation  Patient identified by MRN, date of birth, ID band Patient awake    Reviewed: Allergy & Precautions, H&P , NPO status , Patient's Chart, lab work & pertinent test results  Airway Mallampati: II TM Distance: >3 FB Neck ROM: Full    Dental no notable dental hx. (+) Edentulous Upper   Pulmonary neg pulmonary ROS,  breath sounds clear to auscultation  Pulmonary exam normal       Cardiovascular hypertension, Pt. on medications Rhythm:Regular Rate:Normal     Neuro/Psych negative neurological ROS  negative psych ROS   GI/Hepatic negative GI ROS, Neg liver ROS,   Endo/Other  negative endocrine ROS  Renal/GU negative Renal ROS  negative genitourinary   Musculoskeletal negative musculoskeletal ROS (+)   Abdominal   Peds negative pediatric ROS (+)  Hematology negative hematology ROS (+)   Anesthesia Other Findings   Reproductive/Obstetrics negative OB ROS                           Anesthesia Physical Anesthesia Plan  ASA: II  Anesthesia Plan: General   Post-op Pain Management:    Induction: Intravenous  Airway Management Planned: Mask  Additional Equipment:   Intra-op Plan:   Post-operative Plan:   Informed Consent: I have reviewed the patients History and Physical, chart, labs and discussed the procedure including the risks, benefits and alternatives for the proposed anesthesia with the patient or authorized representative who has indicated his/her understanding and acceptance.   Dental advisory given  Plan Discussed with: CRNA and Surgeon  Anesthesia Plan Comments:         Anesthesia Quick Evaluation

## 2013-02-16 ENCOUNTER — Encounter (HOSPITAL_COMMUNITY): Payer: Self-pay | Admitting: Orthopedic Surgery

## 2013-02-27 NOTE — Anesthesia Postprocedure Evaluation (Signed)
  Anesthesia Post-op Note  Patient: [name] [name]  Procedure(s) Performed: Procedure(s) (LRB): CLOSED MANIPULATION LEFT KNEE (Left)  Patient Location: PACU  Anesthesia Type: General  Level of Consciousness: awake and alert   Airway and Oxygen Therapy: Patient Spontanous Breathing  Post-op Pain: mild  Post-op Assessment: Post-op Vital signs reviewed, Patient's Cardiovascular Status Stable, Respiratory Function Stable, Patent Airway and No signs of Nausea or vomiting  Last Vitals:  Filed Vitals:   02/15/13 1945  BP: 124/77  Pulse: 64  Temp:   Resp: 16    Post-op Vital Signs: stable   Complications: No apparent anesthesia complications

## 2013-03-04 ENCOUNTER — Encounter: Payer: Self-pay | Admitting: Orthopedic Surgery

## 2013-04-03 ENCOUNTER — Encounter: Payer: Self-pay | Admitting: Orthopedic Surgery

## 2013-05-04 ENCOUNTER — Encounter: Payer: Self-pay | Admitting: Orthopedic Surgery

## 2013-05-16 ENCOUNTER — Other Ambulatory Visit: Payer: Self-pay | Admitting: Orthopedic Surgery

## 2013-05-16 NOTE — Progress Notes (Signed)
Surgery scheduled for 05/29/13.  Preop on 05/22/13 at 1pm.  Need orders in EPIC.  Thank You.

## 2013-05-18 ENCOUNTER — Encounter (HOSPITAL_COMMUNITY): Payer: Self-pay | Admitting: Pharmacy Technician

## 2013-05-22 ENCOUNTER — Encounter (HOSPITAL_COMMUNITY): Payer: Self-pay

## 2013-05-22 ENCOUNTER — Encounter (HOSPITAL_COMMUNITY)
Admission: RE | Admit: 2013-05-22 | Discharge: 2013-05-22 | Disposition: A | Discharge status: Home / Self Care | Payer: No Typology Code available for payment source | Source: Ambulatory Visit | Attending: Orthopedic Surgery | Admitting: Orthopedic Surgery

## 2013-05-22 DIAGNOSIS — Z01812 Encounter for preprocedural laboratory examination: Secondary | ICD-10-CM | POA: Insufficient documentation

## 2013-05-22 DIAGNOSIS — Z01818 Encounter for other preprocedural examination: Secondary | ICD-10-CM | POA: Insufficient documentation

## 2013-05-22 LAB — CBC
HCT: 39.8 % (ref 36.0–46.0)
HEMOGLOBIN: 12.5 g/dL (ref 12.0–15.0)
MCH: 26.5 pg (ref 26.0–34.0)
MCHC: 31.4 g/dL (ref 30.0–36.0)
MCV: 84.3 fL (ref 78.0–100.0)
Platelets: 372 10*3/uL (ref 150–400)
RBC: 4.72 MIL/uL (ref 3.87–5.11)
RDW: 14.6 % (ref 11.5–15.5)
WBC: 7.8 10*3/uL (ref 4.0–10.5)

## 2013-05-22 LAB — BASIC METABOLIC PANEL
BUN: 9 mg/dL (ref 6–23)
CALCIUM: 9.3 mg/dL (ref 8.4–10.5)
CO2: 30 mEq/L (ref 19–32)
CREATININE: 0.75 mg/dL (ref 0.50–1.10)
Chloride: 102 mEq/L (ref 96–112)
GFR calc Af Amer: >90 mL/min (ref >90)
GFR calc non Af Amer: 88 mL/min — ABNORMAL LOW (ref >90)
GLUCOSE: 84 mg/dL (ref 70–99)
Potassium: 3.7 mEq/L (ref 3.7–5.3)
SODIUM: 142 meq/L (ref 137–147)

## 2013-05-22 NOTE — Patient Instructions (Addendum)
Mirta Wallen  05/22/2013                           YOUR PROCEDURE IS SCHEDULED ON: 05/29/13               PLEASE REPORT TO SHORT STAY CENTER AT :  10:00 am               CALL THIS NUMBER IF ANY PROBLEMS THE DAY OF SURGERY :               [phone]                      REMEMBER:   Do not eat food or drink liquids AFTER MIDNIGHT  May have clear liquids UNTIL 6 HOURS BEFORE SURGERY (7:00 am)  Clear liquids include soda, tea, black coffee, apple or grape juice, broth.  Take these medicines the morning of surgery with A SIP OF WATER: TRAMADOL   Do not wear jewelry, make-up   Do not wear lotions, powders, or perfumes.   Do not shave legs or underarms 12 hrs. before surgery (men may shave face)  Do not bring valuables to the hospital.  Contacts, dentures or bridgework may not be worn into surgery.  Leave suitcase in the car. After surgery it may be brought to your room.  For patients admitted to the hospital more than one night, checkout time is 11:00                          The day of discharge.   Patients discharged the day of surgery will not be allowed to drive home                             If going home same day of surgery, must have someone stay with you first                           24 hrs at home and arrange for some one to drive you home from hospital.    Special Instructions:   Please read over the following fact sheets that you were given:                                    1. [place]                                                X_____________________________________________________________________        Failure to follow these instructions may result in cancellation of your surgery

## 2013-05-28 NOTE — H&P (Signed)
  CC- [name] [name] is a 63 y.o. female who presents with left knee pain.  HPI- . Knee Pain: Patient presents with pain and stiffness involving the  left knee. Onset of the symptoms was several months ago. Inciting event: Left TKA revision 12/2012. Current symptoms include stiffness. Pain is aggravated by rising after sitting, standing and walking.  Patient has had prior knee problems. Evaluation to date: PT evaluation Limited motion despite extensive PT. Treatment to date: PT which was not very effective.  Past Medical History  Diagnosis Date  . Hypertension   . Arthritis   . Vertigo     hx post op  . Complication of anesthesia     terrible vertigo  . PONV (postoperative nausea and vomiting)     related to vertigo    Past Surgical History  Procedure Laterality Date  . Back surgery  2010    cervical fusion  . Foot surgery Left     heel  . Carpal tunnel release Right   . Breast surgery Left     biopsy  . Total knee revision Left 12/07/2012    Procedure:  TOTAL KNEE ARTHROPLASTY REVISION;  Surgeon: Gearlean Alf, MD;  Location: [facility];  Service: Orthopedics;  Laterality: Left;  . Knee closed reduction Left 02/15/2013    Procedure: CLOSED MANIPULATION LEFT KNEE;  Surgeon: Gearlean Alf, MD;  Location: [facility];  Service: Orthopedics;  Laterality: Left;  . Heel spur surgery    . Joint replacement Left 2013    knee    Prior to Admission medications   Medication Sig Start Date End Date Taking? Authorizing Provider  acetaminophen (TYLENOL) 500 MG tablet Take 500 mg by mouth every 6 (six) hours as needed for pain.    Historical Provider, MD  hydrochlorothiazide (HYDRODIURIL) 25 MG tablet Take 25 mg by mouth every morning.    Historical Provider, MD  ibuprofen (ADVIL,MOTRIN) 200 MG tablet Take 200 mg by mouth every 6 (six) hours as needed for pain.    Historical Provider, MD  meclizine (ANTIVERT) 25 MG tablet Take 25 mg by mouth 3 (three) times daily as needed for dizziness.      Historical Provider, MD  pregabalin (LYRICA) 75 MG capsule Take 75 mg by mouth 2 (two) times daily.    Historical Provider, MD  traMADol (ULTRAM) 50 MG tablet Take 50 mg by mouth every 6 (six) hours as needed for moderate pain.    Historical Provider, MD   KNEE EXAM ROM left knee 20-70 degrees; no warmth or tenderness; no laxity  Physical Examination: General appearance - alert, well appearing, and in no distress Mental status - alert, oriented to person, place, and time Chest - clear to auscultation, no wheezes, rales or rhonchi, symmetric air entry Heart - normal rate, regular rhythm, normal S1, S2, no murmurs, rubs, clicks or gallops Abdomen - soft, nontender, nondistended, no masses or organomegaly Neurological - alert, oriented, normal speech, no focal findings or movement disorder noted   Asessment/Plan--- Left knee arthrofibrosis- - Plan left knee arthrotomy with scar excision. Procedure risks and potential comps discussed with patient who elects to proceed. Goals are decreased pain and increased function with a high likelihood of achieving both

## 2013-05-29 ENCOUNTER — Encounter (HOSPITAL_COMMUNITY): Payer: No Typology Code available for payment source | Admitting: Certified Registered Nurse Anesthetist

## 2013-05-29 ENCOUNTER — Encounter (HOSPITAL_COMMUNITY): Payer: Self-pay | Admitting: *Deleted

## 2013-05-29 ENCOUNTER — Encounter (HOSPITAL_COMMUNITY)
Admission: RE | Disposition: A | Discharge status: Home Health | Payer: Self-pay | Source: Ambulatory Visit | Attending: Orthopedic Surgery

## 2013-05-29 ENCOUNTER — Ambulatory Visit (HOSPITAL_COMMUNITY): Payer: No Typology Code available for payment source | Admitting: Certified Registered Nurse Anesthetist

## 2013-05-29 ENCOUNTER — Inpatient Hospital Stay (HOSPITAL_COMMUNITY)
Admission: RE | Admit: 2013-05-29 | Discharge: 2013-05-31 | DRG: 488 | Disposition: A | Discharge status: Home Health | Payer: No Typology Code available for payment source | Source: Ambulatory Visit | Attending: Orthopedic Surgery | Admitting: Orthopedic Surgery

## 2013-05-29 DIAGNOSIS — M1710 Unilateral primary osteoarthritis, unspecified knee: Secondary | ICD-10-CM | POA: Diagnosis present

## 2013-05-29 DIAGNOSIS — D62 Acute posthemorrhagic anemia: Secondary | ICD-10-CM | POA: Diagnosis not present

## 2013-05-29 DIAGNOSIS — T8482XA Fibrosis due to internal orthopedic prosthetic devices, implants and grafts, initial encounter: Secondary | ICD-10-CM

## 2013-05-29 DIAGNOSIS — M2460 Ankylosis, unspecified joint: Principal | ICD-10-CM

## 2013-05-29 DIAGNOSIS — M25669 Stiffness of unspecified knee, not elsewhere classified: Secondary | ICD-10-CM

## 2013-05-29 DIAGNOSIS — Z96659 Presence of unspecified artificial knee joint: Secondary | ICD-10-CM

## 2013-05-29 DIAGNOSIS — Z79899 Other long term (current) drug therapy: Secondary | ICD-10-CM

## 2013-05-29 DIAGNOSIS — I10 Essential (primary) hypertension: Secondary | ICD-10-CM | POA: Diagnosis present

## 2013-05-29 DIAGNOSIS — E876 Hypokalemia: Secondary | ICD-10-CM | POA: Diagnosis not present

## 2013-05-29 DIAGNOSIS — M179 Osteoarthritis of knee, unspecified: Secondary | ICD-10-CM | POA: Diagnosis present

## 2013-05-29 DIAGNOSIS — M24669 Ankylosis, unspecified knee: Principal | ICD-10-CM | POA: Diagnosis present

## 2013-05-29 DIAGNOSIS — Z791 Long term (current) use of non-steroidal anti-inflammatories (NSAID): Secondary | ICD-10-CM

## 2013-05-29 DIAGNOSIS — T8489XA Other specified complication of internal orthopedic prosthetic devices, implants and grafts, initial encounter: Secondary | ICD-10-CM

## 2013-05-29 HISTORY — PX: KNEE ARTHROTOMY: SHX5881

## 2013-05-29 SURGERY — ARTHROTOMY, KNEE
Anesthesia: Spinal | Site: Knee | Laterality: Left

## 2013-05-29 MED ORDER — MENTHOL 3 MG MT LOZG
1.0000 | LOZENGE | OROMUCOSAL | Status: DC | PRN
  Filled 2013-05-29: qty 9

## 2013-05-29 MED ORDER — MEPERIDINE HCL 50 MG/ML IJ SOLN: 6.2500 mg | INTRAMUSCULAR | Status: DC | PRN

## 2013-05-29 MED ORDER — ENOXAPARIN SODIUM 30 MG/0.3ML ~~LOC~~ SOLN
30.0000 mg | Freq: Two times a day (BID) | SUBCUTANEOUS | Status: DC
  Administered 2013-05-30 – 2013-05-31 (×3): 30 mg via SUBCUTANEOUS
  Filled 2013-05-29 (×5): qty 0.3

## 2013-05-29 MED ORDER — PHENYLEPHRINE 40 MCG/ML (10ML) SYRINGE FOR IV PUSH (FOR BLOOD PRESSURE SUPPORT)
PREFILLED_SYRINGE | INTRAVENOUS | Status: AC
  Filled 2013-05-29: qty 10

## 2013-05-29 MED ORDER — ACETAMINOPHEN 500 MG PO TABS
1000.0000 mg | ORAL_TABLET | Freq: Four times a day (QID) | ORAL | Status: AC
  Administered 2013-05-29 – 2013-05-30 (×3): 1000 mg via ORAL
  Filled 2013-05-29 (×4): qty 2

## 2013-05-29 MED ORDER — SCOPOLAMINE 1 MG/3DAYS TD PT72
MEDICATED_PATCH | TRANSDERMAL | Status: AC
  Filled 2013-05-29: qty 1

## 2013-05-29 MED ORDER — CEFAZOLIN SODIUM-DEXTROSE 2-3 GM-% IV SOLR
INTRAVENOUS | Status: AC
  Filled 2013-05-29: qty 50

## 2013-05-29 MED ORDER — FENTANYL CITRATE 0.05 MG/ML IJ SOLN
INTRAMUSCULAR | Status: AC
  Filled 2013-05-29: qty 2

## 2013-05-29 MED ORDER — DOCUSATE SODIUM 100 MG PO CAPS
100.0000 mg | ORAL_CAPSULE | Freq: Two times a day (BID) | ORAL | Status: DC
  Administered 2013-05-29 – 2013-05-31 (×4): 100 mg via ORAL

## 2013-05-29 MED ORDER — ONDANSETRON HCL 4 MG/2ML IJ SOLN: 4.0000 mg | Freq: Four times a day (QID) | INTRAMUSCULAR | Status: DC | PRN

## 2013-05-29 MED ORDER — BUPIVACAINE HCL (PF) 0.25 % IJ SOLN
INTRAMUSCULAR | Status: AC
  Filled 2013-05-29: qty 30

## 2013-05-29 MED ORDER — PROPOFOL 10 MG/ML IV BOLUS
INTRAVENOUS | Status: DC | PRN
  Administered 2013-05-29: 30 mg via INTRAVENOUS
  Administered 2013-05-29: 20 mg via INTRAVENOUS

## 2013-05-29 MED ORDER — LACTATED RINGERS IV SOLN
INTRAVENOUS | Status: DC | PRN
  Administered 2013-05-29: 12:00:00 via INTRAVENOUS

## 2013-05-29 MED ORDER — METHOCARBAMOL 500 MG PO TABS: 500.0000 mg | ORAL_TABLET | Freq: Four times a day (QID) | ORAL | Status: DC | PRN

## 2013-05-29 MED ORDER — CEFAZOLIN SODIUM-DEXTROSE 2-3 GM-% IV SOLR
INTRAVENOUS | Status: DC | PRN
  Administered 2013-05-29: 2 g via INTRAVENOUS

## 2013-05-29 MED ORDER — FLEET ENEMA 7-19 GM/118ML RE ENEM: 1.0000 | ENEMA | Freq: Once | RECTAL | Status: AC | PRN

## 2013-05-29 MED ORDER — DEXAMETHASONE SODIUM PHOSPHATE 10 MG/ML IJ SOLN
10.0000 mg | Freq: Once | INTRAMUSCULAR | Status: AC
  Administered 2013-05-29: 10 mg via INTRAVENOUS

## 2013-05-29 MED ORDER — KETOROLAC TROMETHAMINE 15 MG/ML IJ SOLN
7.5000 mg | Freq: Four times a day (QID) | INTRAMUSCULAR | Status: AC | PRN
  Administered 2013-05-29: 7.5 mg via INTRAVENOUS

## 2013-05-29 MED ORDER — ACETAMINOPHEN 10 MG/ML IV SOLN
1000.0000 mg | Freq: Once | INTRAVENOUS | Status: AC
  Administered 2013-05-29: 1000 mg via INTRAVENOUS
  Filled 2013-05-29: qty 100

## 2013-05-29 MED ORDER — CEFAZOLIN SODIUM-DEXTROSE 2-3 GM-% IV SOLR
2.0000 g | Freq: Four times a day (QID) | INTRAVENOUS | Status: AC
  Administered 2013-05-29 – 2013-05-30 (×2): 2 g via INTRAVENOUS
  Filled 2013-05-29 (×2): qty 50

## 2013-05-29 MED ORDER — TRAMADOL HCL 50 MG PO TABS
50.0000 mg | ORAL_TABLET | Freq: Four times a day (QID) | ORAL | Status: DC | PRN
Start: 2013-05-29 — End: 2013-05-31
  Administered 2013-05-29: 50 mg via ORAL
  Administered 2013-05-29 – 2013-05-31 (×5): 100 mg via ORAL
  Filled 2013-05-29 (×7): qty 2

## 2013-05-29 MED ORDER — CHLORHEXIDINE GLUCONATE 4 % EX LIQD: 60.0000 mL | Freq: Once | CUTANEOUS | Status: DC

## 2013-05-29 MED ORDER — BISACODYL 10 MG RE SUPP: 10.0000 mg | Freq: Every day | RECTAL | Status: DC | PRN

## 2013-05-29 MED ORDER — DIPHENHYDRAMINE HCL 12.5 MG/5ML PO ELIX: 12.5000 mg | ORAL_SOLUTION | ORAL | Status: DC | PRN

## 2013-05-29 MED ORDER — PROMETHAZINE HCL 25 MG/ML IJ SOLN: 6.2500 mg | INTRAMUSCULAR | Status: DC | PRN

## 2013-05-29 MED ORDER — ACETAMINOPHEN 325 MG PO TABS
650.0000 mg | ORAL_TABLET | Freq: Four times a day (QID) | ORAL | Status: DC | PRN
Start: 2013-05-30 — End: 2013-05-31

## 2013-05-29 MED ORDER — BUPIVACAINE IN DEXTROSE 0.75-8.25 % IT SOLN
INTRATHECAL | Status: DC | PRN
  Administered 2013-05-29: 1.8 mL via INTRATHECAL

## 2013-05-29 MED ORDER — SODIUM CHLORIDE 0.9 % IJ SOLN
INTRAMUSCULAR | Status: DC | PRN
  Administered 2013-05-29: 30 mL via INTRAVENOUS

## 2013-05-29 MED ORDER — DEXAMETHASONE SODIUM PHOSPHATE 10 MG/ML IJ SOLN
10.0000 mg | Freq: Every day | INTRAMUSCULAR | Status: AC
  Filled 2013-05-29: qty 1

## 2013-05-29 MED ORDER — ONDANSETRON HCL 4 MG/2ML IJ SOLN
INTRAMUSCULAR | Status: AC
  Filled 2013-05-29: qty 2

## 2013-05-29 MED ORDER — METHOCARBAMOL 100 MG/ML IJ SOLN
500.0000 mg | Freq: Four times a day (QID) | INTRAMUSCULAR | Status: DC | PRN
  Administered 2013-05-29: 500 mg via INTRAVENOUS
  Filled 2013-05-29: qty 5

## 2013-05-29 MED ORDER — FENTANYL CITRATE 0.05 MG/ML IJ SOLN: 25.0000 ug | INTRAMUSCULAR | Status: DC | PRN

## 2013-05-29 MED ORDER — KETAMINE HCL 10 MG/ML IJ SOLN
INTRAMUSCULAR | Status: DC | PRN
  Administered 2013-05-29: 50 mg via INTRAVENOUS

## 2013-05-29 MED ORDER — PROPOFOL 10 MG/ML IV BOLUS
INTRAVENOUS | Status: AC
  Filled 2013-05-29: qty 20

## 2013-05-29 MED ORDER — PHENYLEPHRINE HCL 10 MG/ML IJ SOLN
INTRAMUSCULAR | Status: DC | PRN
  Administered 2013-05-29: 40 ug via INTRAVENOUS

## 2013-05-29 MED ORDER — DEXAMETHASONE 6 MG PO TABS
10.0000 mg | ORAL_TABLET | Freq: Every day | ORAL | Status: AC
  Administered 2013-05-30: 10 mg via ORAL
  Filled 2013-05-29: qty 1

## 2013-05-29 MED ORDER — LACTATED RINGERS IV SOLN: INTRAVENOUS | Status: DC

## 2013-05-29 MED ORDER — POLYETHYLENE GLYCOL 3350 17 G PO PACK
17.0000 g | PACK | Freq: Every day | ORAL | Status: DC | PRN
  Administered 2013-05-30: 17 g via ORAL

## 2013-05-29 MED ORDER — BUPIVACAINE LIPOSOME 1.3 % IJ SUSP
INTRAMUSCULAR | Status: DC | PRN
  Administered 2013-05-29: 20 mL

## 2013-05-29 MED ORDER — BUPIVACAINE LIPOSOME 1.3 % IJ SUSP
20.0000 mL | Freq: Once | INTRAMUSCULAR | Status: DC
  Filled 2013-05-29: qty 20

## 2013-05-29 MED ORDER — 0.9 % SODIUM CHLORIDE (POUR BTL) OPTIME
TOPICAL | Status: DC | PRN
  Administered 2013-05-29: 1000 mL

## 2013-05-29 MED ORDER — SCOPOLAMINE 1 MG/3DAYS TD PT72
MEDICATED_PATCH | TRANSDERMAL | Status: DC | PRN
  Administered 2013-05-29: 1 via TRANSDERMAL

## 2013-05-29 MED ORDER — HYDROCHLOROTHIAZIDE 25 MG PO TABS
25.0000 mg | ORAL_TABLET | Freq: Every morning | ORAL | Status: DC
  Administered 2013-05-30 – 2013-05-31 (×2): 25 mg via ORAL
  Filled 2013-05-29 (×2): qty 1

## 2013-05-29 MED ORDER — PREGABALIN 75 MG PO CAPS
75.0000 mg | ORAL_CAPSULE | Freq: Two times a day (BID) | ORAL | Status: DC
  Administered 2013-05-29 – 2013-05-31 (×4): 75 mg via ORAL
  Filled 2013-05-29 (×4): qty 1

## 2013-05-29 MED ORDER — PROPOFOL INFUSION 10 MG/ML OPTIME
INTRAVENOUS | Status: DC | PRN
  Administered 2013-05-29: 75 ug/kg/min via INTRAVENOUS

## 2013-05-29 MED ORDER — ONDANSETRON HCL 4 MG/2ML IJ SOLN
INTRAMUSCULAR | Status: DC | PRN
  Administered 2013-05-29: 4 mg via INTRAVENOUS

## 2013-05-29 MED ORDER — MECLIZINE HCL 25 MG PO TABS
25.0000 mg | ORAL_TABLET | Freq: Three times a day (TID) | ORAL | Status: DC | PRN
  Filled 2013-05-29: qty 1

## 2013-05-29 MED ORDER — DEXAMETHASONE SODIUM PHOSPHATE 10 MG/ML IJ SOLN
INTRAMUSCULAR | Status: AC
  Filled 2013-05-29: qty 1

## 2013-05-29 MED ORDER — KETOROLAC TROMETHAMINE 15 MG/ML IJ SOLN
INTRAMUSCULAR | Status: AC
  Administered 2013-05-29: 7.5 mg via INTRAVENOUS
  Filled 2013-05-29: qty 1

## 2013-05-29 MED ORDER — MORPHINE SULFATE 2 MG/ML IJ SOLN
1.0000 mg | INTRAMUSCULAR | Status: DC | PRN
  Administered 2013-05-29: 1 mg via INTRAVENOUS
  Filled 2013-05-29: qty 1

## 2013-05-29 MED ORDER — ONDANSETRON HCL 4 MG PO TABS: 4.0000 mg | ORAL_TABLET | Freq: Four times a day (QID) | ORAL | Status: DC | PRN

## 2013-05-29 MED ORDER — ACETAMINOPHEN 650 MG RE SUPP
650.0000 mg | Freq: Four times a day (QID) | RECTAL | Status: DC | PRN
  Filled 2013-05-29: qty 1

## 2013-05-29 MED ORDER — KETAMINE HCL 10 MG/ML IJ SOLN
INTRAMUSCULAR | Status: AC
  Filled 2013-05-29: qty 1

## 2013-05-29 MED ORDER — PHENYLEPHRINE HCL 10 MG/ML IJ SOLN
INTRAMUSCULAR | Status: AC
  Filled 2013-05-29: qty 1

## 2013-05-29 MED ORDER — METOCLOPRAMIDE HCL 5 MG PO TABS
5.0000 mg | ORAL_TABLET | Freq: Three times a day (TID) | ORAL | Status: DC | PRN
  Filled 2013-05-29: qty 2

## 2013-05-29 MED ORDER — MIDAZOLAM HCL 2 MG/2ML IJ SOLN
INTRAMUSCULAR | Status: AC
  Filled 2013-05-29: qty 2

## 2013-05-29 MED ORDER — SODIUM CHLORIDE 0.9 % IV SOLN
INTRAVENOUS | Status: DC
  Administered 2013-05-29 – 2013-05-30 (×2): via INTRAVENOUS

## 2013-05-29 MED ORDER — SODIUM CHLORIDE 0.9 % IJ SOLN
INTRAMUSCULAR | Status: AC
  Filled 2013-05-29: qty 50

## 2013-05-29 MED ORDER — METOCLOPRAMIDE HCL 5 MG/ML IJ SOLN: 5.0000 mg | Freq: Three times a day (TID) | INTRAMUSCULAR | Status: DC | PRN

## 2013-05-29 MED ORDER — PHENOL 1.4 % MT LIQD
1.0000 | OROMUCOSAL | Status: DC | PRN
  Filled 2013-05-29: qty 177

## 2013-05-29 MED ORDER — BUPIVACAINE HCL 0.25 % IJ SOLN
INTRAMUSCULAR | Status: DC | PRN
  Administered 2013-05-29: 20 mL

## 2013-05-29 MED ORDER — SODIUM CHLORIDE 0.9 % IV SOLN
INTRAVENOUS | Status: DC
  Administered 2013-05-29: 16:00:00 via INTRAVENOUS

## 2013-05-29 SURGICAL SUPPLY — 34 items
BAG ZIPLOCK 12X15 (MISCELLANEOUS) ×3 IMPLANT
BANDAGE ELASTIC 6 VELCRO ST LF (GAUZE/BANDAGES/DRESSINGS) ×3 IMPLANT
BANDAGE ESMARK 6X9 LF (GAUZE/BANDAGES/DRESSINGS) ×1 IMPLANT
BNDG ESMARK 6X9 LF (GAUZE/BANDAGES/DRESSINGS) ×3
CLOSURE WOUND 1/2 X4 (GAUZE/BANDAGES/DRESSINGS) ×1
CUFF TOURN SGL QUICK 34 (TOURNIQUET CUFF)
CUFF TRNQT CYL 34X4X40X1 (TOURNIQUET CUFF) IMPLANT
DRAPE EXTREMITY T 121X128X90 (DRAPE) ×3 IMPLANT
DRAPE U-SHAPE 47X51 STRL (DRAPES) ×3 IMPLANT
DRSG ADAPTIC 3X8 NADH LF (GAUZE/BANDAGES/DRESSINGS) ×3 IMPLANT
DURAPREP 26ML APPLICATOR (WOUND CARE) ×3 IMPLANT
ELECT REM PT RETURN 9FT ADLT (ELECTROSURGICAL) ×3
ELECTRODE REM PT RTRN 9FT ADLT (ELECTROSURGICAL) ×1 IMPLANT
GLOVE BIO SURGEON STRL SZ7.5 (GLOVE) ×3 IMPLANT
GLOVE BIO SURGEON STRL SZ8 (GLOVE) ×3 IMPLANT
GLOVE BIOGEL PI IND STRL 8 (GLOVE) ×3 IMPLANT
GLOVE BIOGEL PI INDICATOR 8 (GLOVE) ×6
GOWN STRL REUS W/TWL LRG LVL3 (GOWN DISPOSABLE) ×3 IMPLANT
GOWN STRL REUS W/TWL XL LVL3 (GOWN DISPOSABLE) ×3 IMPLANT
IMMOBILIZER KNEE 20 (SOFTGOODS) ×3 IMPLANT
KIT BASIN OR (CUSTOM PROCEDURE TRAY) ×3 IMPLANT
MANIFOLD NEPTUNE II (INSTRUMENTS) ×3 IMPLANT
PACK TOTAL JOINT (CUSTOM PROCEDURE TRAY) ×3 IMPLANT
PAD ABD 8X10 STRL (GAUZE/BANDAGES/DRESSINGS) ×3 IMPLANT
PADDING CAST COTTON 6X4 STRL (CAST SUPPLIES) ×3 IMPLANT
POSITIONER SURGICAL ARM (MISCELLANEOUS) ×3 IMPLANT
SPONGE GAUZE 4X4 12PLY (GAUZE/BANDAGES/DRESSINGS) ×3 IMPLANT
STRIP CLOSURE SKIN 1/2X4 (GAUZE/BANDAGES/DRESSINGS) ×2 IMPLANT
SUT MNCRL AB 4-0 PS2 18 (SUTURE) ×3 IMPLANT
SUT PDS AB 1 CT1 27 (SUTURE) ×6 IMPLANT
SUT VIC AB 2-0 CT1 27 (SUTURE) ×4
SUT VIC AB 2-0 CT1 TAPERPNT 27 (SUTURE) ×2 IMPLANT
SUT VLOC 180 0 24IN GS25 (SUTURE) ×3 IMPLANT
TOWEL OR 17X26 10 PK STRL BLUE (TOWEL DISPOSABLE) ×6 IMPLANT

## 2013-05-29 NOTE — Transfer of Care (Signed)
Immediate Anesthesia Transfer of Care Note  Patient: [name] [name]  Procedure(s) Performed: Procedure(s): LEFT KNEE ARTHROTOMY WITH SCAR EXCISION (Left)  Patient Location: PACU  Anesthesia Type:MAC and Spinal  Level of Consciousness: awake and alert   Airway & Oxygen Therapy: Patient Spontanous Breathing and Patient connected to face mask oxygen  Post-op Assessment: Report given to PACU RN and Post -op Vital signs reviewed and stable  Post vital signs: Reviewed and stable  Complications: No apparent anesthesia complications

## 2013-05-29 NOTE — Brief Op Note (Signed)
05/29/2013  1:57 PM  PATIENT:  [name] [name]  63 y.o. female  PRE-OPERATIVE DIAGNOSIS:   ARTHROFIBROSIS OF LEFT KNEE  POST-OPERATIVE DIAGNOSIS:ARTHROFIBROSIS OF LEFT KNEE   PROCEDURE:  Procedure(s): LEFT KNEE ARTHROTOMY WITH SCAR EXCISION (Left)  SURGEON:  Surgeon(s) and Role:    * Gearlean Alf, MD - Primary  PHYSICIAN ASSISTANT:   ASSISTANTS: Arlee Muslim, PA-C   ANESTHESIA:   spinal  EBL:     DRAINS: (Medium) Hemovact drain(s) in the left knee with  Suction Open   LOCAL MEDICATIONS USED:  OTHER Exparel  COUNTS:  YES  TOURNIQUET:   Total Tourniquet Time Documented: Thigh (Left) - 31 minutes Total: Thigh (Left) - 31 minutes   DICTATION: .Other Dictation: Dictation Number [id number]  PLAN OF CARE: Admit for overnight observation  PATIENT DISPOSITION:  PACU - hemodynamically stable.

## 2013-05-29 NOTE — Anesthesia Postprocedure Evaluation (Signed)
  Anesthesia Post-op Note  Patient: [name] [name]  Procedure(s) Performed: Procedure(s) (LRB): LEFT KNEE ARTHROTOMY WITH SCAR EXCISION (Left)  Patient Location: PACU  Anesthesia Type: Spinal  Level of Consciousness: awake and alert   Airway and Oxygen Therapy: Patient Spontanous Breathing  Post-op Pain: mild  Post-op Assessment: Post-op Vital signs reviewed, Patient's Cardiovascular Status Stable, Respiratory Function Stable, Patent Airway and No signs of Nausea or vomiting  Last Vitals:  Filed Vitals:   05/29/13 1730  BP: 141/91  Pulse: 104  Temp: 36.8 C  Resp: 18    Post-op Vital Signs: stable   Complications: No apparent anesthesia complications

## 2013-05-29 NOTE — Anesthesia Procedure Notes (Signed)
Spinal  Patient location during procedure: OR Start time: 05/29/2013 12:53 PM Staffing Anesthesiologist: Montez Hageman CRNA/Resident: [place] Dariann Huckaba C Performed by: resident/CRNA  Preanesthetic Checklist Completed: patient identified, site marked, surgical consent, pre-op evaluation, timeout performed, IV checked, risks and benefits discussed and monitors and equipment checked Spinal Block Patient position: sitting Prep: Betadine Patient monitoring: heart rate, cardiac monitor, continuous pulse ox and blood pressure Approach: midline Location: L3-4 Injection technique: single-shot Needle Needle type: Sprotte  Needle gauge: 24 G Assessment Sensory level: T6

## 2013-05-29 NOTE — Anesthesia Preprocedure Evaluation (Signed)
Anesthesia Evaluation  Patient identified by MRN, date of birth, ID band Patient awake    Reviewed: Allergy & Precautions, H&P , NPO status , Patient's Chart, lab work & pertinent test results  History of Anesthesia Complications (+) PONVHistory of anesthetic complications: severe post-op vertigo with GA.  Airway Mallampati: II TM Distance: >3 FB Neck ROM: Full    Dental no notable dental hx. (+) Edentulous Upper   Pulmonary neg pulmonary ROS,  breath sounds clear to auscultation  Pulmonary exam normal       Cardiovascular hypertension, Pt. on medications Rhythm:Regular Rate:Normal     Neuro/Psych negative neurological ROS  negative psych ROS   GI/Hepatic negative GI ROS, Neg liver ROS,   Endo/Other  negative endocrine ROS  Renal/GU negative Renal ROS  negative genitourinary   Musculoskeletal negative musculoskeletal ROS (+)   Abdominal   Peds negative pediatric ROS (+)  Hematology negative hematology ROS (+)   Anesthesia Other Findings   Reproductive/Obstetrics negative OB ROS                           Anesthesia Physical  Anesthesia Plan  ASA: II  Anesthesia Plan: Spinal   Post-op Pain Management:    Induction:   Airway Management Planned: Simple Face Mask  Additional Equipment:   Intra-op Plan:   Post-operative Plan:   Informed Consent: I have reviewed the patients History and Physical, chart, labs and discussed the procedure including the risks, benefits and alternatives for the proposed anesthesia with the patient or authorized representative who has indicated his/her understanding and acceptance.   Dental advisory given  Plan Discussed with: CRNA and Surgeon  Anesthesia Plan Comments: (Severe post-op vertigo after previous anesthetics. Will try SAB)        Anesthesia Quick Evaluation

## 2013-05-29 NOTE — Interval H&P Note (Signed)
History and Physical Interval Note:  05/29/2013 12:33 PM  [name] [name]  has presented today for surgery, with the diagnosis of ORTHO FIBROSIS OF LEFT KNEE  The various methods of treatment have been discussed with the patient and family. After consideration of risks, benefits and other options for treatment, the patient has consented to  Procedure(s): LEFT KNEE ARTHROTOMY WITH SCAR EXCISION (Left) as a surgical intervention .  The patient's history has been reviewed, patient examined, no change in status, stable for surgery.  I have reviewed the patient's chart and labs.  Questions were answered to the patient's satisfaction.     Gearlean Alf

## 2013-05-30 ENCOUNTER — Encounter (HOSPITAL_COMMUNITY): Payer: Self-pay | Admitting: Orthopedic Surgery

## 2013-05-30 DIAGNOSIS — E876 Hypokalemia: Secondary | ICD-10-CM | POA: Diagnosis not present

## 2013-05-30 LAB — BASIC METABOLIC PANEL
BUN: 13 mg/dL (ref 6–23)
CALCIUM: 8.6 mg/dL (ref 8.4–10.5)
CO2: 26 mEq/L (ref 19–32)
Chloride: 103 mEq/L (ref 96–112)
Creatinine, Ser: 0.65 mg/dL (ref 0.50–1.10)
GFR calc Af Amer: >90 mL/min (ref >90)
Glucose, Bld: 139 mg/dL — ABNORMAL HIGH (ref 70–99)
Potassium: 3.5 mEq/L — ABNORMAL LOW (ref 3.7–5.3)
Sodium: 140 mEq/L (ref 137–147)

## 2013-05-30 LAB — CBC
HEMATOCRIT: 33.9 % — AB (ref 36.0–46.0)
HEMOGLOBIN: 11 g/dL — AB (ref 12.0–15.0)
MCH: 27 pg (ref 26.0–34.0)
MCHC: 32.4 g/dL (ref 30.0–36.0)
MCV: 83.3 fL (ref 78.0–100.0)
Platelets: 354 10*3/uL (ref 150–400)
RBC: 4.07 MIL/uL (ref 3.87–5.11)
RDW: 14.8 % (ref 11.5–15.5)
WBC: 9.3 10*3/uL (ref 4.0–10.5)

## 2013-05-30 NOTE — Op Note (Signed)
NAMEAILEE, [name] [name]            ACCOUNT NO.:  [id number]  MEDICAL RECORD NO.:  [id number]  LOCATION:  69                         FACILITY:  [facility]  PHYSICIAN:  Gaynelle Arabian, M.D.    DATE OF BIRTH:  Oct 12, 1950  DATE OF PROCEDURE:  05/29/2013 DATE OF DISCHARGE:                              OPERATIVE REPORT   PREOPERATIVE DIAGNOSIS:  Arthrofibrosis, left knee.  POSTOPERATIVE DIAGNOSIS:  Arthrofibrosis, left knee.  PROCEDURE:  Left knee arthrotomy with scar excision.  SURGEON:  Gaynelle Arabian, M.D.  ASSISTANT:  Alexzandrew L. Perkins, PA-C.  ANESTHESIA:  Spinal.  ESTIMATED BLOOD LOSS:  Minimal.  DRAINS:  Hemovac x1.  TOURNIQUET TIME:  31 minutes at 300 mmHg.  COMPLICATIONS:  None.  CONDITION:  Stable to recovery.  BRIEF CLINICAL NOTE:  Ms. Bowler is a 63 year old female, had a left total knee arthroplasty done in [place] in December, 2013.  This unfortunately was complicated with early loosening and severe scar.  We revised this knee in August 2014, and she has gone on to develop scar tissue again.  She had a close manipulation.  Did well initially, but then scarred up again.  She presents now for arthrotomy with scar excision.  Her preoperative range of motion is about 25-70.  PROCEDURE IN DETAIL:  After successful administration of spinal anesthetic, a tourniquet was placed high on her left thigh and her left lower extremity was prepped and draped in the usual sterile fashion. Extremities wrapped in Esmarch.  Tourniquet was inflated to 300 mmHg.  A midline incision was made with a 10 blade through the subcutaneous tissue.  The subcutaneous tissue was also scarred down and thus I did thorough dissecting of the subcu to free it from the underlying tissues all throughout the area of the knee.  We then used a fresh blade to make a medial parapatellar arthrotomy.  The tremendous amount of scar present in the joint.  Using the combination of the knife and  electrocautery, the scar was excised from under the extensor mechanism, then we recreated the medial and lateral gutters and infrapatellar pouch.  The scar was subsequently removed from the anterior aspect of the femur.  I then dissected all the way up to the level of the tourniquet and the underside of the surface of the quad mechanism was free from the femur up to that point.  The remainder of the scar to medial and lateral gutters was then excised.  I then did flexion against gravity and was able to get it to 110 degrees.  Extension was at 25 and then with manipulation and audible lysis of adhesions, we were able to get her within 5 degrees of full extension.  The wound was then copiously irrigated with saline solution.  The arthrotomy was closed over a Hemovac drain, utilizing running #1 V-Loc suture, also utilizing interrupted #1 PDS suture.  Flexion against gravity was about 100-105 degrees.  A second limb of the Hemovac drain was placed in the subcutaneous tissues, and tourniquet then released at total time of 31 minutes.  Minor bleeding stopped with cautery.  Subcu was closed with interrupted 2-0 Vicryl, and subcuticular running 4-0 Monocryl.  The drain was hooked to suction.  Incision cleaned and dried, and Steri- Strips and a sterile dressing applied.  She was subsequently awakened and transported to recovery in stable condition.  Note that, a surgical assistant was a medical necessity for this procedure in order to perform in a safe and expeditious manner. Surgical assistant was necessary for retraction of vital neurovascular structures and also for protection of the extensor mechanism while removing this tremendous amount of scar tissue.  Also necessary for closure of this wound.     Gaynelle Arabian, M.D.     FA/MEDQ  D:  05/29/2013  T:  05/30/2013  Job:  [id number]

## 2013-05-30 NOTE — Progress Notes (Signed)
05/30/13 1400  PT Visit Information  Last PT Received On 05/30/13  Assistance Needed +1  History of Present Illness s/p arthrotomy and scar excision left knee  PT Time Calculation  PT Start Time 1330  PT Stop Time 1353  PT Time Calculation (min) 23 min  Subjective Data  Patient Stated Goal I  Precautions  Required Braces or Orthoses Knee Immobilizer - Left  Restrictions  Other Position/Activity Restrictions WBAT  Cognition  Arousal/Alertness Awake/alert  Behavior During Therapy WFL for tasks assessed/performed  Overall Cognitive Status Within Functional Limits for tasks assessed  Bed Mobility  Overal bed mobility Modified Independent  Transfers  Overall transfer level Needs assistance  Equipment used Lofstrands  Transfers Sit to/from Stand  Sit to Stand Supervision  General transfer comment supervision for safety   Ambulation/Gait  Ambulation/Gait assistance Supervision  Ambulation Distance (Feet) 200 Feet  Assistive device Lofstrands  Gait Pattern/deviations Step-to pattern;Step-through pattern  General Gait Details cues for heel strike and push off   Total Joint Exercises  Quad Sets AROM;5 reps  Heel Slides AROM;AAROM;Left;20 reps;Supine  Goniometric ROM 10-78*  Other Exercises  Other Exercises gentle patella mobs to improve knee ROM  PT - End of Session  Equipment Utilized During Treatment Left knee immobilizer  Activity Tolerance Patient tolerated treatment well  Patient left with call bell/phone within reach;in bed  Nurse Communication Mobility status  PT - Assessment/Plan  PT Plan Current plan remains appropriate  PT Frequency 7X/week  Follow Up Recommendations Home health PT  PT equipment None recommended by PT  PT Goal Progression  Progress towards PT goals Progressing toward goals  Acute Rehab PT Goals  Time For Goal Achievement 06/06/13  Potential to Achieve Goals Good  PT General Charges  $$ ACUTE PT VISIT 1 Procedure  PT Treatments  $Gait Training  8-22 mins  $Therapeutic Exercise 8-22 mins

## 2013-05-30 NOTE — Progress Notes (Signed)
OT Cancellation Note  Patient Details Name: [name] [name] MRN: [id number] DOB: November 23, 1950   Cancelled Treatment:    Reason Eval/Treat Not Completed: PT screened, no needs identified, will sign off  Peggye Poon 05/30/2013, 11:17 AM Lesle Chris, OTR/L [phone] 05/30/2013

## 2013-05-30 NOTE — Evaluation (Signed)
Physical Therapy Evaluation Patient Details Name: [name] [name] MRN: [id number] DOB: 1950-08-06 Today's Date: 05/30/2013 Time: 9449-6759 PT Time Calculation (min): 20 min  PT Assessment / Plan / Recommendation History of Present Illness  s/p arthrotomy and scar excision left knee  Clinical Impression  Pt will benefit from PT to address deficits below    PT Assessment  Patient needs continued PT services    Follow Up Recommendations  Home health PT    Does the patient have the potential to tolerate intense rehabilitation      Barriers to Discharge        Equipment Recommendations  None recommended by PT    Recommendations for Other Services     Frequency 7X/week    Precautions / Restrictions Precautions Precautions: Fall Restrictions Weight Bearing Restrictions: No Other Position/Activity Restrictions: WBAT   Pertinent Vitals/Pain Pain controlled      Mobility  Bed Mobility Overal bed mobility: Modified Independent Transfers Overall transfer level: Needs assistance Equipment used: Lofstrands Transfers: Sit to/from Stand Sit to Stand: Supervision General transfer comment: supervision for safety  Ambulation/Gait Ambulation/Gait assistance: Supervision Ambulation Distance (Feet): 200 Feet Assistive device: Lofstrands Gait Pattern/deviations: Step-to pattern;Step-through pattern;Decreased stride length    Exercises Total Joint Exercises Ankle Circles/Pumps: AROM;Both;5 reps Quad Sets: AROM;Left;10 reps Heel Slides: AROM;AAROM;Left;10 reps   PT Diagnosis: Difficulty walking  PT Problem List: Decreased strength;Decreased range of motion;Decreased activity tolerance;Decreased balance;Decreased mobility PT Treatment Interventions: Stair training;Gait training;Functional mobility training;Therapeutic activities;Therapeutic exercise;Patient/family education;DME instruction     PT Goals(Current goals can be found in the care plan section) Acute Rehab PT  Goals Time For Goal Achievement: 06/06/13 Potential to Achieve Goals: Good  Visit Information  Last PT Received On: 05/30/13 Assistance Needed: +1 History of Present Illness: s/p arthrotomy and scar excision       Prior Functioning  Home Living Family/patient expects to be discharged to:: Private residence Living Arrangements: Alone Type of Home: House Home Access: Stairs to enter Technical brewer of Steps: 2 Entrance Stairs-Rails: Right;Left;Can reach both Home Layout: One level Home Equipment: Bedside commode;Shower seat;Adaptive equipment Adaptive Equipment: Reacher;Long-handled shoe horn Additional Comments: lofstarand crutches Prior Function Level of Independence: Independent with assistive device(s) Communication Communication: No difficulties    Cognition  Cognition Arousal/Alertness: Awake/alert Behavior During Therapy: WFL for tasks assessed/performed Overall Cognitive Status: Within Functional Limits for tasks assessed    Extremity/Trunk Assessment Upper Extremity Assessment Upper Extremity Assessment: Overall WFL for tasks assessed Lower Extremity Assessment Lower Extremity Assessment: LLE deficits/detail LLE Deficits / Details: limited prior to surgery ~20-80 degrees flexion per pt; able to assist with SLR and ankle WFL at this time   Balance    End of Session PT - End of Session Equipment Utilized During Treatment: Left knee immobilizer Activity Tolerance: Patient tolerated treatment well Patient left: in chair;with call bell/phone within reach Nurse Communication: Mobility status  GP     [facility] 05/30/2013, 1:59 PM

## 2013-05-30 NOTE — Progress Notes (Signed)
Utilization review completed.  

## 2013-05-30 NOTE — Progress Notes (Signed)
   Subjective: 1 Day Post-Op Procedure(s) (LRB): LEFT KNEE ARTHROTOMY WITH SCAR EXCISION (Left) Patient reports pain as mild and moderate.   Patient seen in rounds by Dr. Wynelle Link. Patient is well, but has had some minor complaints of pain in the knee, requiring pain medications We will start therapy today.  Plan is to go Home after hospital stay.  Objective: Vital signs in last 24 hours: Temp:  [97.4 F (36.3 C)-98.4 F (36.9 C)] 97.7 F (36.5 C) (01/27 0501) Pulse Rate:  [52-104] 61 (01/27 0501) Resp:  [11-20] 17 (01/27 0501) BP: (105-151)/(55-91) 112/65 mmHg (01/27 0501) SpO2:  [97 %-100 %] 99 % (01/27 0501) Weight:  [96.616 kg (213 lb)] 96.616 kg (213 lb) (01/26 2045)  Intake/Output from previous day:  Intake/Output Summary (Last 24 hours) at 05/30/13 0754 Last data filed at 05/30/13 0503  Gross per 24 hour  Intake   1670 ml  Output    265 ml  Net   1405 ml    Intake/Output this shift:    Labs:  Recent Labs  05/30/13 0516  HGB 11.0*    Recent Labs  05/30/13 0516  WBC 9.3  RBC 4.07  HCT 33.9*  PLT 354    Recent Labs  05/30/13 0516  NA 140  K 3.5*  CL 103  CO2 26  BUN 13  CREATININE 0.65  GLUCOSE 139*  CALCIUM 8.6   No results found for this basename: LABPT, INR,  in the last 72 hours  EXAM General - Patient is Alert, Appropriate and Oriented Extremity - Neurovascular intact Sensation intact distally Dorsiflexion/Plantar flexion intact Dressing - dressing C/D/I Motor Function - intact, moving foot and toes well on exam.  Hemovac pulled without difficulty.  Past Medical History  Diagnosis Date  . Hypertension   . Arthritis   . Vertigo     hx post op  . Complication of anesthesia     terrible vertigo  . PONV (postoperative nausea and vomiting)     related to vertigo    Assessment/Plan: 1 Day Post-Op Procedure(s) (LRB): LEFT KNEE ARTHROTOMY WITH SCAR EXCISION (Left) Active Problems:   Postoperative anemia due to acute blood  loss   Postoperative stiffness of total knee replacement   OA (osteoarthritis) of knee   Hypokalemia  Estimated body mass index is 30.56 kg/(m^2) as calculated from the following:   Height as of this encounter: 5\' 10"  (1.778 m).   Weight as of this encounter: 96.616 kg (213 lb). Advance diet Up with therapy Plan for discharge tomorrow Discharge home with home health  DVT Prophylaxis - Lovenox Weight-Bearing as tolerated to left leg D/C O2 and Pulse OX and try on Room Air  Mickel Crow 05/30/2013, 7:54 AM

## 2013-05-31 LAB — CBC
HCT: 34.5 % — ABNORMAL LOW (ref 36.0–46.0)
HEMOGLOBIN: 10.8 g/dL — AB (ref 12.0–15.0)
MCH: 26.7 pg (ref 26.0–34.0)
MCHC: 31.3 g/dL (ref 30.0–36.0)
MCV: 85.2 fL (ref 78.0–100.0)
PLATELETS: 329 10*3/uL (ref 150–400)
RBC: 4.05 MIL/uL (ref 3.87–5.11)
RDW: 15.1 % (ref 11.5–15.5)
WBC: 12.4 10*3/uL — ABNORMAL HIGH (ref 4.0–10.5)

## 2013-05-31 LAB — BASIC METABOLIC PANEL
BUN: 12 mg/dL (ref 6–23)
CO2: 31 meq/L (ref 19–32)
CREATININE: 0.7 mg/dL (ref 0.50–1.10)
Calcium: 8.8 mg/dL (ref 8.4–10.5)
Chloride: 101 mEq/L (ref 96–112)
GFR calc Af Amer: >90 mL/min (ref >90)
GFR calc non Af Amer: >90 mL/min (ref >90)
GLUCOSE: 123 mg/dL — AB (ref 70–99)
Potassium: 4.2 mEq/L (ref 3.7–5.3)
SODIUM: 139 meq/L (ref 137–147)

## 2013-05-31 MED ORDER — METHOCARBAMOL 500 MG PO TABS: 500.0000 mg | ORAL_TABLET | Freq: Four times a day (QID) | ORAL | Status: DC | PRN

## 2013-05-31 MED ORDER — HYDROCODONE-ACETAMINOPHEN 5-325 MG PO TABS: 1.0000 | ORAL_TABLET | Freq: Four times a day (QID) | ORAL | Status: DC | PRN

## 2013-05-31 MED ORDER — TRAMADOL HCL 50 MG PO TABS: 50.0000 mg | ORAL_TABLET | Freq: Four times a day (QID) | ORAL | Status: DC | PRN

## 2013-05-31 MED ORDER — ASPIRIN EC 325 MG PO TBEC: 325.0000 mg | DELAYED_RELEASE_TABLET | Freq: Two times a day (BID) | ORAL | Status: DC

## 2013-05-31 NOTE — Care Management Note (Signed)
    Page 1 of 1   05/31/2013     4:02:47 PM   CARE MANAGEMENT NOTE 05/31/2013  Patient:  [name] [name],[name] [name]   Account Number:  [id number]  Date Initiated:  05/31/2013  Documentation initiated by:  Sherrin Daisy  Subjective/Objective Assessment:   dx orthofibrosis left knee     Action/Plan:   CM spoke with patient. Plans are for patient to return to her home where her sisters will assist with her care. Also have other family members who will offer care.   Anticipated DC Date:  05/31/2013   Anticipated DC Plan:  [place]  CM consult      [facility] Choice  HOME HEALTH   Choice offered to / List presented to:  C-1 Patient        [place] arranged  HH-2 PT      [place]   Status of service:  Completed, signed off Medicare Important Message given?   (If response is "NO", the following Medicare IM given date fields will be blank) Date Medicare IM given:   Date Additional Medicare IM given:    Discharge Disposition:  [place]  Per UR Regulation:  Reviewed for med. necessity/level of care/duration of stay  If discussed at [place] of Stay Meetings, dates discussed:    Comments:  05/31/2013 Sherrin Daisy BSN RN CCM [phone] [place] [facility] will provide [facility] pt services. They also have arranged for home CPM. Pt states it will be deliveredto her home today.

## 2013-05-31 NOTE — Progress Notes (Signed)
Physical Therapy Treatment Patient Details Name: [name] [name] MRN: [id number] DOB: 01-03-1951 Today's Date: 05/31/2013 Time: 4627-0350 PT Time Calculation (min): 23 min  PT Assessment / Plan / Recommendation  History of Present Illness s/p arthrotomy and scar excision left knee   PT Comments   *Pt is modified independent with mobility, ready to DC home from PT standpoint.**  Follow Up Recommendations  Home health PT     Does the patient have the potential to tolerate intense rehabilitation     Barriers to Discharge        Equipment Recommendations  None recommended by PT    Recommendations for Other Services    Frequency 7X/week   Progress towards PT Goals Progress towards PT goals: Goals met/education completed, patient discharged from PT  Plan Current plan remains appropriate    Precautions / Restrictions Precautions Precautions: Fall Required Braces or Orthoses: Knee Immobilizer - Left Restrictions Other Position/Activity Restrictions: WBAT   Pertinent Vitals/Pain *3/10 L knee Ice applied**    Mobility  Bed Mobility Overal bed mobility: Modified Independent Transfers Overall transfer level: Modified independent Equipment used: Lofstrands Transfers: Sit to/from Stand Sit to Stand: Modified independent (Device/Increase time) General transfer comment: supervision for safety  Ambulation/Gait Ambulation/Gait assistance: Modified independent (Device/Increase time) Ambulation Distance (Feet): 300 Feet Assistive device: Lofstrands Gait Pattern/deviations: Step-through pattern Stairs: Yes Stairs assistance: Modified independent (Device/Increase time) Stair Management: No rails;With crutches;Forwards    Exercises Total Joint Exercises Quad Sets: AROM;10 reps Short Arc QuadSinclair [name];Left;15 reps;Supine Heel Slides: AROM;AAROM;Left;20 reps;Supine Straight Leg Raises: AAROM;Left;10 reps;AROM;Supine Goniometric ROM: 18-65* AAROM L knee    PT Diagnosis:    PT  Problem List:   PT Treatment Interventions:     PT Goals (current goals can now be found in the care plan section) Acute Rehab PT Goals Patient Stated Goal: to walk without crutches PT Goal Formulation: With patient Time For Goal Achievement: 06/06/13 Potential to Achieve Goals: Good  Visit Information  Last PT Received On: 05/31/13 Assistance Needed: +1 History of Present Illness: s/p arthrotomy and scar excision left knee    Subjective Data  Patient Stated Goal: to walk without crutches   Cognition  Cognition Arousal/Alertness: Awake/alert Behavior During Therapy: WFL for tasks assessed/performed Overall Cognitive Status: Within Functional Limits for tasks assessed    Balance     End of Session PT - End of Session Equipment Utilized During Treatment: Left knee immobilizer Activity Tolerance: Patient tolerated treatment well Patient left: with call bell/phone within reach;in chair Nurse Communication: Mobility status   GP     [name] [name] 05/31/2013, 1:50 PM [phone]

## 2013-05-31 NOTE — Progress Notes (Signed)
RN reviewed discharge instructions with patient. All questions answered. Prescriptions, dressing supplies,  and paperwork given to patient  RN took patient down stairs to family car.

## 2013-05-31 NOTE — Discharge Instructions (Signed)
Pick up stool softner and laxative for home. Do not submerge incision under water. May shower. Continue to use ice for pain and swelling from surgery. Take an enteric coated Aspirin 325 mg twice a day for four weeks.

## 2013-05-31 NOTE — Discharge Summary (Signed)
Physician Discharge Summary   Patient ID: [name] [name] MRN: [id number] DOB/AGE: 1950/12/30 63 y.o.  Admit date: 05/29/2013 Discharge date: 05/31/2013  Primary Diagnosis:  Arthrofibrosis, left knee.  Admission Diagnoses:  Past Medical History  Diagnosis Date  . Hypertension   . Arthritis   . Vertigo     hx post op  . Complication of anesthesia     terrible vertigo  . PONV (postoperative nausea and vomiting)     related to vertigo   Discharge Diagnoses:   Active Problems:   Postoperative anemia due to acute blood loss   Postoperative stiffness of total knee replacement   OA (osteoarthritis) of knee   Hypokalemia  Estimated body mass index is 30.56 kg/(m^2) as calculated from the following:   Height as of this encounter: '5\' 10"'  (1.778 m).   Weight as of this encounter: 96.616 kg (213 lb).  Procedure:  Procedure(s) (LRB): LEFT KNEE ARTHROTOMY WITH SCAR EXCISION (Left)   Consults: None  HPI: [name] [name] is a 63 year old female, had a left  total knee arthroplasty done in [place] in December, 2013. This  unfortunately was complicated with early loosening and severe scar. We  revised this knee in August 2014, and she has gone on to develop scar  tissue again. She had a close manipulation. Did well initially, but  then scarred up again. She presents now for arthrotomy with scar  excision. Her preoperative range of motion is about 25-70.  Laboratory Data: Admission on 05/29/2013, Discharged on 05/31/2013  Component Date Value Range Status  . WBC 05/30/2013 9.3  4.0 - 10.5 K/uL Final  . RBC 05/30/2013 4.07  3.87 - 5.11 MIL/uL Final  . Hemoglobin 05/30/2013 11.0* 12.0 - 15.0 g/dL Final  . HCT 05/30/2013 33.9* 36.0 - 46.0 % Final  . MCV 05/30/2013 83.3  78.0 - 100.0 fL Final  . MCH 05/30/2013 27.0  26.0 - 34.0 pg Final  . MCHC 05/30/2013 32.4  30.0 - 36.0 g/dL Final  . RDW 05/30/2013 14.8  11.5 - 15.5 % Final  . Platelets 05/30/2013 354  150 - 400 K/uL Final  . Sodium  05/30/2013 140  137 - 147 mEq/L Final  . Potassium 05/30/2013 3.5* 3.7 - 5.3 mEq/L Final  . Chloride 05/30/2013 103  96 - 112 mEq/L Final  . CO2 05/30/2013 26  19 - 32 mEq/L Final  . Glucose, Bld 05/30/2013 139* 70 - 99 mg/dL Final  . BUN 05/30/2013 13  6 - 23 mg/dL Final  . Creatinine, Ser 05/30/2013 0.65  0.50 - 1.10 mg/dL Final  . Calcium 05/30/2013 8.6  8.4 - 10.5 mg/dL Final  . GFR calc non Af Amer 05/30/2013 >90  >90 mL/min Final  . GFR calc Af Amer 05/30/2013 >90  >90 mL/min Final   Comment: (NOTE)                          The eGFR has been calculated using the CKD EPI equation.                          This calculation has not been validated in all clinical situations.                          eGFR's persistently <90 mL/min signify possible Chronic Kidney  Disease.  . WBC 05/31/2013 12.4* 4.0 - 10.5 K/uL Final  . RBC 05/31/2013 4.05  3.87 - 5.11 MIL/uL Final  . Hemoglobin 05/31/2013 10.8* 12.0 - 15.0 g/dL Final  . HCT 05/31/2013 34.5* 36.0 - 46.0 % Final  . MCV 05/31/2013 85.2  78.0 - 100.0 fL Final  . MCH 05/31/2013 26.7  26.0 - 34.0 pg Final  . MCHC 05/31/2013 31.3  30.0 - 36.0 g/dL Final  . RDW 05/31/2013 15.1  11.5 - 15.5 % Final  . Platelets 05/31/2013 329  150 - 400 K/uL Final  . Sodium 05/31/2013 139  137 - 147 mEq/L Final  . Potassium 05/31/2013 4.2  3.7 - 5.3 mEq/L Final  . Chloride 05/31/2013 101  96 - 112 mEq/L Final  . CO2 05/31/2013 31  19 - 32 mEq/L Final  . Glucose, Bld 05/31/2013 123* 70 - 99 mg/dL Final  . BUN 05/31/2013 12  6 - 23 mg/dL Final  . Creatinine, Ser 05/31/2013 0.70  0.50 - 1.10 mg/dL Final  . Calcium 05/31/2013 8.8  8.4 - 10.5 mg/dL Final  . GFR calc non Af Amer 05/31/2013 >90  >90 mL/min Final  . GFR calc Af Amer 05/31/2013 >90  >90 mL/min Final   Comment: (NOTE)                          The eGFR has been calculated using the CKD EPI equation.                          This calculation has not been validated in all  clinical situations.                          eGFR's persistently <90 mL/min signify possible Chronic Kidney                          Disease.  Hospital Outpatient Visit on 05/22/2013  Component Date Value Range Status  . WBC 05/22/2013 7.8  4.0 - 10.5 K/uL Final  . RBC 05/22/2013 4.72  3.87 - 5.11 MIL/uL Final  . Hemoglobin 05/22/2013 12.5  12.0 - 15.0 g/dL Final  . HCT 05/22/2013 39.8  36.0 - 46.0 % Final  . MCV 05/22/2013 84.3  78.0 - 100.0 fL Final  . MCH 05/22/2013 26.5  26.0 - 34.0 pg Final  . MCHC 05/22/2013 31.4  30.0 - 36.0 g/dL Final  . RDW 05/22/2013 14.6  11.5 - 15.5 % Final  . Platelets 05/22/2013 372  150 - 400 K/uL Final  . Sodium 05/22/2013 142  137 - 147 mEq/L Final  . Potassium 05/22/2013 3.7  3.7 - 5.3 mEq/L Final  . Chloride 05/22/2013 102  96 - 112 mEq/L Final  . CO2 05/22/2013 30  19 - 32 mEq/L Final  . Glucose, Bld 05/22/2013 84  70 - 99 mg/dL Final  . BUN 05/22/2013 9  6 - 23 mg/dL Final  . Creatinine, Ser 05/22/2013 0.75  0.50 - 1.10 mg/dL Final  . Calcium 05/22/2013 9.3  8.4 - 10.5 mg/dL Final  . GFR calc non Af Amer 05/22/2013 88* >90 mL/min Final  . GFR calc Af Amer 05/22/2013 >90  >90 mL/min Final   Comment: (NOTE)                          The  eGFR has been calculated using the CKD EPI equation.                          This calculation has not been validated in all clinical situations.                          eGFR's persistently <90 mL/min signify possible Chronic Kidney                          Disease.     X-Rays:No results found.  EKG:No orders found for this or any previous visit.   Hospital Course: [name] [name] is a 63 y.o. who was admitted to [facility]. They were brought to the operating room on 05/29/2013 and underwent Procedure(s): LEFT KNEE ARTHROTOMY WITH SCAR EXCISION.  Patient tolerated the procedure well and was later transferred to the recovery room and then to the orthopaedic floor for postoperative care.  They were given  PO and IV analgesics for pain control following their surgery.  They were given 24 hours of postoperative antibiotics of  Anti-infectives   Start     Dose/Rate Route Frequency Ordered Stop   05/29/13 1900  ceFAZolin (ANCEF) IVPB 2 g/50 mL premix     2 g 100 mL/hr over 30 Minutes Intravenous Every 6 hours 05/29/13 1546 05/30/13 0116     and started on DVT prophylaxis in the form of Lovenox.   PT and OT were ordered for total joint protocol.  Discharge planning consulted to help with postop disposition and equipment needs.  Patient had a tough night on the evening of surgery.  They started to get up OOB with therapy on day one. Hemovac drain was pulled without difficulty.  Continued to work with therapy into day two.  Dressing was changed on day two and the incision was healing well.  Patient was seen in rounds on day two and it was felt that she was ready to go home.   Discharge Medications: Prior to Admission medications   Medication Sig Start Date End Date Taking? Authorizing Provider  acetaminophen (TYLENOL) 500 MG tablet Take 500 mg by mouth every 6 (six) hours as needed for pain.   Yes Historical Provider, MD  hydrochlorothiazide (HYDRODIURIL) 25 MG tablet Take 25 mg by mouth every morning.   Yes Historical Provider, MD  ibuprofen (ADVIL,MOTRIN) 200 MG tablet Take 200 mg by mouth every 6 (six) hours as needed for pain.   Yes Historical Provider, MD  aspirin EC 325 MG tablet Take 1 tablet (325 mg total) by mouth 2 (two) times daily. Take twice a day for four weeks. 05/31/13   Joette Schmoker Dara Lords, PA-C  HYDROcodone-acetaminophen (NORCO/VICODIN) 5-325 MG per tablet Take 1-2 tablets by mouth every 6 (six) hours as needed. 05/31/13   Debbera Wolken Dara Lords, PA-C  meclizine (ANTIVERT) 25 MG tablet Take 25 mg by mouth 3 (three) times daily as needed for dizziness.     Historical Provider, MD  methocarbamol (ROBAXIN) 500 MG tablet Take 1 tablet (500 mg total) by mouth every 6 (six) hours as needed for  muscle spasms. 05/31/13   Elexis Pollak, PA-C  pregabalin (LYRICA) 75 MG capsule Take 75 mg by mouth 2 (two) times daily.    Historical Provider, MD  traMADol (ULTRAM) 50 MG tablet Take 1 tablet (50 mg total) by mouth every 6 (six) hours as needed (mild to moderate pain).  05/31/13   Kemani Heidel Dara Lords, PA-C   Discharge home with home health  Diet - Cardiac diet  Follow up - in 2 weeks  Activity - WBAT  Disposition - Home  Condition Upon Discharge - Stable  D/C Meds - See DC Summary  DVT Prophylaxis - Lovenox while in hospital and then switched over to ASA 325 mg at home twice a day.       Discharge Orders   Future Orders Complete By Expires   Call MD / Call 911  As directed    Comments:     If you experience chest pain or shortness of breath, CALL 911 and be transported to the hospital emergency room.  If you develope a fever above 101 F, pus (white drainage) or increased drainage or redness at the wound, or calf pain, call your surgeon's office.   Change dressing  As directed    Comments:     Change dressing daily with sterile 4 x 4 inch gauze dressing and apply TED hose. Do not submerge the incision under water.   Constipation Prevention  As directed    Comments:     Drink plenty of fluids.  Prune juice may be helpful.  You may use a stool softener, such as Colace (over the counter) 100 mg twice a day.  Use MiraLax (over the counter) for constipation as needed.   CPM  As directed    Comments:     Continuous passive motion machine (CPM):      Use the CPM from Zero to 50 degrees for at least 6 hours per day.      You may increase by 5 degrees per day.  You may break it up into 2 or 3 sessions per day.      Use CPM for 4 weeks or until you are told to stop.   Diet - low sodium heart healthy  As directed    Discharge instructions  As directed    Comments:     Pick up stool softner and laxative for home. Do not submerge incision under water. May shower. Continue to use ice  for pain and swelling from surgery.  Take an enteric coated 325 mg Aspirin twice a day for four weeks.   Do not put a pillow under the knee. Place it under the heel.  As directed    Do not sit on low chairs, stoools or toilet seats, as it may be difficult to get up from low surfaces  As directed    Driving restrictions  As directed    Comments:     No driving until released by the physician.   Increase activity slowly as tolerated  As directed    Lifting restrictions  As directed    Comments:     No lifting until released by the physician.   Patient may shower  As directed    Comments:     You may shower without a dressing once there is no drainage.  Do not wash over the wound.  If drainage remains, do not shower until drainage stops.   TED hose  As directed    Comments:     Use stockings (TED hose) for 3 weeks on both leg(s).  You may remove them at night for sleeping.   Weight bearing as tolerated  As directed    Questions:     Laterality:     Extremity:         Medication List  STOP taking these medications       ibuprofen 200 MG tablet  Commonly known as:  ADVIL,MOTRIN      TAKE these medications       acetaminophen 500 MG tablet  Commonly known as:  TYLENOL  Take 500 mg by mouth every 6 (six) hours as needed for pain.     aspirin EC 325 MG tablet  Take 1 tablet (325 mg total) by mouth 2 (two) times daily. Take twice a day for four weeks.     hydrochlorothiazide 25 MG tablet  Commonly known as:  HYDRODIURIL  Take 25 mg by mouth every morning.     HYDROcodone-acetaminophen 5-325 MG per tablet  Commonly known as:  NORCO/VICODIN  Take 1-2 tablets by mouth every 6 (six) hours as needed.     meclizine 25 MG tablet  Commonly known as:  ANTIVERT  Take 25 mg by mouth 3 (three) times daily as needed for dizziness.     methocarbamol 500 MG tablet  Commonly known as:  ROBAXIN  Take 1 tablet (500 mg total) by mouth every 6 (six) hours as needed for muscle spasms.       pregabalin 75 MG capsule  Commonly known as:  LYRICA  Take 75 mg by mouth 2 (two) times daily.     traMADol 50 MG tablet  Commonly known as:  ULTRAM  Take 1 tablet (50 mg total) by mouth every 6 (six) hours as needed (mild to moderate pain).       Follow-up Information   Follow up with Gearlean Alf, MD. Schedule an appointment as soon as possible for a visit in 2 weeks.   Specialty:  Orthopedic Surgery   Contact information:   [address] [place] [address] [phone]       Signed: Mickel Crow 06/08/2013, 8:28 AM

## 2013-05-31 NOTE — Progress Notes (Signed)
   Subjective: 2 Days Post-Op Procedure(s) (LRB): LEFT KNEE ARTHROTOMY WITH SCAR EXCISION (Left) Patient reports pain as mild and moderate.   Patient seen in rounds with Dr. Wynelle Link. Patient is well, and has had no acute complaints or problems Patient is ready to go home today  Objective: Vital signs in last 24 hours: Temp:  [97.7 F (36.5 C)-97.8 F (36.6 C)] 97.7 F (36.5 C) (01/28 0500) Pulse Rate:  [43-60] 43 (01/28 0500) Resp:  [16-17] 16 (01/28 0500) BP: (105-116)/(67-69) 110/68 mmHg (01/28 0500) SpO2:  [94 %-97 %] 97 % (01/28 0500)  Intake/Output from previous day:  Intake/Output Summary (Last 24 hours) at 05/31/13 1157 Last data filed at 05/31/13 0900  Gross per 24 hour  Intake 2189.92 ml  Output      0 ml  Net 2189.92 ml    Intake/Output this shift: Total I/O In: 240 [P.O.:240] Out: -   Labs:  Recent Labs  05/30/13 0516 05/31/13 0423  HGB 11.0* 10.8*    Recent Labs  05/30/13 0516 05/31/13 0423  WBC 9.3 12.4*  RBC 4.07 4.05  HCT 33.9* 34.5*  PLT 354 329    Recent Labs  05/30/13 0516 05/31/13 0423  NA 140 139  K 3.5* 4.2  CL 103 101  CO2 26 31  BUN 13 12  CREATININE 0.65 0.70  GLUCOSE 139* 123*  CALCIUM 8.6 8.8   No results found for this basename: LABPT, INR,  in the last 72 hours  EXAM: General - Patient is Alert, Appropriate and Oriented Extremity - Neurovascular intact Sensation intact distally Incision - clean, dry, no drainage Motor Function - intact, moving foot and toes well on exam.   Assessment/Plan: 2 Days Post-Op Procedure(s) (LRB): LEFT KNEE ARTHROTOMY WITH SCAR EXCISION (Left) Procedure(s) (LRB): LEFT KNEE ARTHROTOMY WITH SCAR EXCISION (Left) Past Medical History  Diagnosis Date  . Hypertension   . Arthritis   . Vertigo     hx post op  . Complication of anesthesia     terrible vertigo  . PONV (postoperative nausea and vomiting)     related to vertigo   Active Problems:   Postoperative anemia due to acute  blood loss   Postoperative stiffness of total knee replacement   OA (osteoarthritis) of knee   Hypokalemia  Estimated body mass index is 30.56 kg/(m^2) as calculated from the following:   Height as of this encounter: 5\' 10"  (1.778 m).   Weight as of this encounter: 96.616 kg (213 lb). Up with therapy Discharge home with home health Diet - Cardiac diet Follow up - in 2 weeks Activity - WBAT Disposition - Home Condition Upon Discharge - Stable D/C Meds - See DC Summary DVT Prophylaxis - Lovenox  PERKINS, ALEXZANDREW 05/31/2013, 11:57 AM

## 2013-06-19 ENCOUNTER — Encounter: Payer: Self-pay | Admitting: Orthopedic Surgery

## 2013-07-02 ENCOUNTER — Encounter: Payer: Self-pay | Admitting: Orthopedic Surgery

## 2014-02-16 ENCOUNTER — Ambulatory Visit: Payer: Self-pay | Admitting: Family Medicine

## 2014-02-20 ENCOUNTER — Ambulatory Visit: Payer: Self-pay | Admitting: Orthopedic Surgery

## 2014-02-20 NOTE — Progress Notes (Signed)
Please put orders in Epic surgery 03-05-14 pre  op 02-26-14 Thanks

## 2014-02-20 NOTE — Progress Notes (Signed)
Preoperative surgical orders have been place into the Epic hospital system for [name] [name] on 02/20/2014, 2:12 PM  by Mickel Crow for surgery on 03/05/2014.  Preop Knee orders including Experal, IV Tylenol, and IV Decadron as long as there are no contraindications to the above medications. Arlee Muslim, PA-C

## 2014-02-21 ENCOUNTER — Encounter (HOSPITAL_COMMUNITY): Payer: Self-pay | Admitting: Pharmacy Technician

## 2014-02-26 ENCOUNTER — Encounter (HOSPITAL_COMMUNITY): Payer: Self-pay

## 2014-02-26 ENCOUNTER — Encounter (HOSPITAL_COMMUNITY)
Admission: RE | Admit: 2014-02-26 | Discharge: 2014-02-26 | Disposition: A | Discharge status: Home / Self Care | Payer: No Typology Code available for payment source | Source: Ambulatory Visit | Attending: Orthopedic Surgery | Admitting: Orthopedic Surgery

## 2014-02-26 ENCOUNTER — Ambulatory Visit (HOSPITAL_COMMUNITY)
Admission: RE | Admit: 2014-02-26 | Discharge: 2014-02-26 | Disposition: A | Discharge status: Home / Self Care | Payer: No Typology Code available for payment source | Source: Ambulatory Visit | Attending: Orthopedic Surgery | Admitting: Orthopedic Surgery

## 2014-02-26 DIAGNOSIS — R0602 Shortness of breath: Secondary | ICD-10-CM | POA: Insufficient documentation

## 2014-02-26 DIAGNOSIS — M24669 Ankylosis, unspecified knee: Secondary | ICD-10-CM | POA: Diagnosis not present

## 2014-02-26 DIAGNOSIS — I10 Essential (primary) hypertension: Secondary | ICD-10-CM | POA: Diagnosis not present

## 2014-02-26 DIAGNOSIS — M179 Osteoarthritis of knee, unspecified: Secondary | ICD-10-CM | POA: Diagnosis not present

## 2014-02-26 DIAGNOSIS — Z01818 Encounter for other preprocedural examination: Secondary | ICD-10-CM | POA: Diagnosis present

## 2014-02-26 HISTORY — DX: Shortness of breath: R06.02

## 2014-02-26 LAB — CBC
HCT: 41.6 % (ref 36.0–46.0)
HEMOGLOBIN: 13.1 g/dL (ref 12.0–15.0)
MCH: 27.4 pg (ref 26.0–34.0)
MCHC: 31.5 g/dL (ref 30.0–36.0)
MCV: 87 fL (ref 78.0–100.0)
Platelets: 339 10*3/uL (ref 150–400)
RBC: 4.78 MIL/uL (ref 3.87–5.11)
RDW: 13.7 % (ref 11.5–15.5)
WBC: 6.9 10*3/uL (ref 4.0–10.5)

## 2014-02-26 LAB — COMPREHENSIVE METABOLIC PANEL
ALK PHOS: 124 U/L — AB (ref 39–117)
ALT: 17 U/L (ref 0–35)
AST: 23 U/L (ref 0–37)
Albumin: 3.4 g/dL — ABNORMAL LOW (ref 3.5–5.2)
Anion gap: 11 (ref 5–15)
BILIRUBIN TOTAL: 0.3 mg/dL (ref 0.3–1.2)
BUN: 12 mg/dL (ref 6–23)
CO2: 29 mEq/L (ref 19–32)
Calcium: 9.3 mg/dL (ref 8.4–10.5)
Chloride: 101 mEq/L (ref 96–112)
Creatinine, Ser: 0.71 mg/dL (ref 0.50–1.10)
GFR calc non Af Amer: 89 mL/min — ABNORMAL LOW (ref >90)
Glucose, Bld: 88 mg/dL (ref 70–99)
POTASSIUM: 4 meq/L (ref 3.7–5.3)
SODIUM: 141 meq/L (ref 137–147)
Total Protein: 7.4 g/dL (ref 6.0–8.3)

## 2014-02-26 LAB — URINALYSIS, ROUTINE W REFLEX MICROSCOPIC
Bilirubin Urine: NEGATIVE
GLUCOSE, UA: NEGATIVE mg/dL
HGB URINE DIPSTICK: NEGATIVE
Ketones, ur: NEGATIVE mg/dL
Nitrite: NEGATIVE
Protein, ur: NEGATIVE mg/dL
SPECIFIC GRAVITY, URINE: 1.016 (ref 1.005–1.030)
UROBILINOGEN UA: 1 mg/dL (ref 0.0–1.0)
pH: 6.5 (ref 5.0–8.0)

## 2014-02-26 LAB — URINE MICROSCOPIC-ADD ON

## 2014-02-26 LAB — SURGICAL PCR SCREEN
MRSA, PCR: NEGATIVE
Staphylococcus aureus: NEGATIVE

## 2014-02-26 LAB — APTT: APTT: 29 s (ref 24–37)

## 2014-02-26 LAB — PROTIME-INR
INR: 1.04 (ref 0.00–1.49)
Prothrombin Time: 13.7 seconds (ref 11.6–15.2)

## 2014-02-26 NOTE — Patient Instructions (Addendum)
YOUR SURGERY IS SCHEDULED AT [facility]  ON:  Monday  11-2  REPORT TO  SHORT STAY CENTER AT:  12:00 PM   DO NOT EAT ANYTHING AFTER MIDNIGHT THE NIGHT BEFORE YOUR SURGERY.   NO FOOD, NO CHEWING GUM, NO MINTS, NO CANDIES, NO CHEWING TOBACCO. YOU MAY HAVE CLEAR LIQUIDS TO DRINK FROM MIDNIGHT UNTIL 9:00 AM DAY OF SURGERY  -  LIKE WATER,  SODA, GATORADE.    NOTHING TO DRINK AFTER 9:00 AM DAY OF YOUR SURGERY.  PLEASE TAKE THE FOLLOWING MEDICATIONS THE AM OF YOUR SURGERY WITH A FEW SIPS OF WATER:  NO MEDS TO TAKE EXCEPT USE YOUR ALBUTEROL.    DO NOT BRING VALUABLES, MONEY, CREDIT CARDS.  DO NOT WEAR JEWELRY, MAKE-UP, NAIL POLISH AND NO METAL PINS OR CLIPS IN YOUR HAIR. CONTACT LENS, DENTURES / PARTIALS, GLASSES SHOULD NOT BE WORN TO SURGERY AND IN MOST CASES-HEARING AIDS WILL NEED TO BE REMOVED.  BRING YOUR GLASSES CASE, ANY EQUIPMENT NEEDED FOR YOUR CONTACT LENS. FOR PATIENTS ADMITTED TO THE HOSPITAL--CHECK OUT TIME THE DAY OF DISCHARGE IS 11:00 AM.  ALL INPATIENT ROOMS ARE PRIVATE - WITH BATHROOM, TELEPHONE, TELEVISION AND WIFI INTERNET.    PLEASE BE AWARE THAT YOU MAY NEED ADDITIONAL BLOOD DRAWN DAY OF YOUR SURGERY  _______________________________________________________________________   [facility] [place] - Preparing for Surgery Before surgery, you can play an important role.  Because skin is not sterile, your skin needs to be as free of germs as possible.  You can reduce the number of germs on your skin by washing with CHG (chlorahexidine gluconate) soap before surgery.  CHG is an antiseptic cleaner which kills germs and bonds with the skin to continue killing germs even after washing. Please DO NOT use if you have an allergy to CHG or antibacterial soaps.  If your skin becomes reddened/irritated stop using the CHG and inform your nurse when you arrive at Short Stay. Do not shave (including legs and underarms) for at least 48 hours prior to the first CHG shower.  You may shave  your face/neck. Please follow these instructions carefully:  1.  Shower with CHG Soap the night before surgery and the  morning of Surgery.  2.  If you choose to wash your hair, wash your hair first as usual with your  normal  shampoo.  3.  After you shampoo, rinse your hair and body thoroughly to remove the  shampoo.                           4.  Use CHG as you would any other liquid soap.  You can apply chg directly  to the skin and wash                       Gently with a scrungie or clean washcloth.  5.  Apply the CHG Soap to your body ONLY FROM THE NECK DOWN.   Do not use on face/ open                           Wound or open sores. Avoid contact with eyes, ears mouth and genitals (private parts).                       Wash face,  Genitals (private parts) with your normal soap.  6.  Wash thoroughly, paying special attention to the area where your surgery  will be performed.  7.  Thoroughly rinse your body with warm water from the neck down.  8.  DO NOT shower/wash with your normal soap after using and rinsing off  the CHG Soap.                9.  Pat yourself dry with a clean towel.            10.  Wear clean pajamas.            11.  Place clean sheets on your bed the night of your first shower and do not  sleep with pets. Day of Surgery : Do not apply any lotions/deodorants the morning of surgery.  Please wear clean clothes to the hospital/surgery center.  FAILURE TO FOLLOW THESE INSTRUCTIONS MAY RESULT IN THE CANCELLATION OF YOUR SURGERY PATIENT SIGNATURE_________________________________  NURSE SIGNATURE__________________________________  ________________________________________________________________________   Adam Phenix  An incentive spirometer is a tool that can help keep your lungs clear and active. This tool measures how well you are filling your lungs with each breath. Taking long deep breaths may help reverse or decrease the chance of developing  breathing (pulmonary) problems (especially infection) following:  A long period of time when you are unable to move or be active. BEFORE THE PROCEDURE   If the spirometer includes an indicator to show your best effort, your nurse or respiratory therapist will set it to a desired goal.  If possible, sit up straight or lean slightly forward. Try not to slouch.  Hold the incentive spirometer in an upright position. INSTRUCTIONS FOR USE  1. Sit on the edge of your bed if possible, or sit up as far as you can in bed or on a chair. 2. Hold the incentive spirometer in an upright position. 3. Breathe out normally. 4. Place the mouthpiece in your mouth and seal your lips tightly around it. 5. Breathe in slowly and as deeply as possible, raising the piston or the ball toward the top of the column. 6. Hold your breath for 3-5 seconds or for as long as possible. Allow the piston or ball to fall to the bottom of the column. 7. Remove the mouthpiece from your mouth and breathe out normally. 8. Rest for a few seconds and repeat Steps 1 through 7 at least 10 times every 1-2 hours when you are awake. Take your time and take a few normal breaths between deep breaths. 9. The spirometer may include an indicator to show your best effort. Use the indicator as a goal to work toward during each repetition. 10. After each set of 10 deep breaths, practice coughing to be sure your lungs are clear. If you have an incision (the cut made at the time of surgery), support your incision when coughing by placing a pillow or rolled up towels firmly against it. Once you are able to get out of bed, walk around indoors and cough well. You may stop using the incentive spirometer when instructed by your caregiver.  RISKS AND COMPLICATIONS  Take your time so you do not get dizzy or light-headed.  If you are in pain, you may need to take or ask for pain medication before doing incentive spirometry. It is harder to take a deep  breath if you are having pain. AFTER USE  Rest and breathe slowly and easily.  It can be helpful to keep track of a log of  your progress. Your caregiver can provide you with a simple table to help with this. If you are using the spirometer at home, follow these instructions: [place] IF:   You are having difficultly using the spirometer.  You have trouble using the spirometer as often as instructed.  Your pain medication is not giving enough relief while using the spirometer.  You develop fever of 100.5 F (38.1 C) or higher. SEEK IMMEDIATE MEDICAL CARE IF:   You cough up bloody sputum that had not been present before.  You develop fever of 102 F (38.9 C) or greater.  You develop worsening pain at or near the incision site. MAKE SURE YOU:   Understand these instructions.  Will watch your condition.  Will get help right away if you are not doing well or get worse. Document Released: 08/31/2006 Document Revised: 07/13/2011 Document Reviewed: 11/01/2006 ExitCare Patient Information 2014 ExitCare, [place].   ________________________________________________________________________  WHAT IS A BLOOD TRANSFUSION? Blood Transfusion Information  A transfusion is the replacement of blood or some of its parts. Blood is made up of multiple cells which provide different functions.  Red blood cells carry oxygen and are used for blood loss replacement.  White blood cells fight against infection.  Platelets control bleeding.  Plasma helps clot blood.  Other blood products are available for specialized needs, such as hemophilia or other clotting disorders. BEFORE THE TRANSFUSION  Who gives blood for transfusions?   Healthy volunteers who are fully evaluated to make sure their blood is safe. This is blood bank blood. Transfusion therapy is the safest it has ever been in the practice of medicine. Before blood is taken from a donor, a complete history is taken to make sure  that person has no history of diseases nor engages in risky social behavior (examples are intravenous drug use or sexual activity with multiple partners). The donor's travel history is screened to minimize risk of transmitting infections, such as malaria. The donated blood is tested for signs of infectious diseases, such as HIV and hepatitis. The blood is then tested to be sure it is compatible with you in order to minimize the chance of a transfusion reaction. If you or a relative donates blood, this is often done in anticipation of surgery and is not appropriate for emergency situations. It takes many days to process the donated blood. RISKS AND COMPLICATIONS Although transfusion therapy is very safe and saves many lives, the main dangers of transfusion include:   Getting an infectious disease.  Developing a transfusion reaction. This is an allergic reaction to something in the blood you were given. Every precaution is taken to prevent this. The decision to have a blood transfusion has been considered carefully by your caregiver before blood is given. Blood is not given unless the benefits outweigh the risks. AFTER THE TRANSFUSION  Right after receiving a blood transfusion, you will usually feel much better and more energetic. This is especially true if your red blood cells have gotten low (anemic). The transfusion raises the level of the red blood cells which carry oxygen, and this usually causes an energy increase.  The nurse administering the transfusion will monitor you carefully for complications. HOME CARE INSTRUCTIONS  No special instructions are needed after a transfusion. You may find your energy is better. Speak with your caregiver about any limitations on activity for underlying diseases you may have. SEEK MEDICAL CARE IF:   Your condition is not improving after your transfusion.  You develop redness or  irritation at the intravenous (IV) site. SEEK IMMEDIATE MEDICAL CARE IF:  Any of  the following symptoms occur over the next 12 hours:  Shaking chills.  You have a temperature by mouth above 102 F (38.9 C), not controlled by medicine.  Chest, back, or muscle pain.  People around you feel you are not acting correctly or are confused.  Shortness of breath or difficulty breathing.  Dizziness and fainting.  You get a rash or develop hives.  You have a decrease in urine output.  Your urine turns a dark color or changes to pink, red, or brown. Any of the following symptoms occur over the next 10 days:  You have a temperature by mouth above 102 F (38.9 C), not controlled by medicine.  Shortness of breath.  Weakness after normal activity.  The white part of the eye turns yellow (jaundice).  You have a decrease in the amount of urine or are urinating less often.  Your urine turns a dark color or changes to pink, red, or brown. Document Released: 04/17/2000 Document Revised: 07/13/2011 Document Reviewed: 12/05/2007 [facility] Patient Information 2014 ExitCare, [place].  _______________________________________________________________________ TO

## 2014-02-26 NOTE — Pre-Procedure Instructions (Signed)
EKG REPORT ON CHART FROM DR. Penton  FROM 01-23-14. CXR WAS DONE TODAY PREOP AT [facility].

## 2014-02-28 ENCOUNTER — Other Ambulatory Visit: Payer: Self-pay | Admitting: Surgical

## 2014-02-28 MED ORDER — TRANEXAMIC ACID 100 MG/ML IV SOLN: 1000.0000 mg | INTRAVENOUS | Status: AC

## 2014-02-28 NOTE — H&P (Signed)
TOTAL KNEE REVISION ADMISSION H&P  Patient is being admitted for left revision total knee arthroplasty (femoral component) with left knee arthrotomy and scar excision.  Subjective:  Chief Complaint:left knee pain.  HPI: [name] [name], 63 y.o. female, has a history of pain and functional disability in the left knee(s) due to arthritis and failed previous arthroplasty and patient has failed non-surgical conservative treatments for greater than 12 weeks to include NSAID's and/or analgesics, corticosteriod injections, viscosupplementation injections, flexibility and strengthening excercises, supervised PT with diminished ADL's post treatment, use of assistive devices and activity modification. 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 Onset of symptoms was gradual starting 2 years ago with gradually worsening course since that time.  Prior procedures on the left knee(s) include arthroplasty and closed manipulation.  Patient currently rates pain in the left knee(s) at 9 out of 10 with activity. There is night pain, worsening of pain with activity and weight bearing, pain that interferes with activities of daily living, crepitus and joint swelling. Radiographs show her prosthesis in good position. 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 by imaging studies. This condition presents safety issues increasing the risk of falls.  There is no current active infection.  Patient Active Problem List   Diagnosis Date Noted  . Hypokalemia 05/30/2013  . OA (osteoarthritis) of knee 05/29/2013  . Postoperative stiffness of total knee replacement 02/15/2013  . Postoperative anemia due to acute blood loss 12/09/2012  . 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 Hypertension   . Arthritis   . Vertigo     hx post op  . Complication of anesthesia     terrible vertigo  . PONV (postoperative nausea and vomiting)     related to vertigo  . Shortness of breath     WITH EXERTION    Past Surgical History  Procedure Laterality Date  . Back surgery  2010    cervical fusion  . Foot surgery Left     heel---STATES METAL IN THE HEEL - FOOT TURNS OUTWARD  . Carpal tunnel release Right   . Breast surgery Left     biopsy  . Total knee revision Left 12/07/2012    Procedure:  TOTAL KNEE ARTHROPLASTY REVISION;  Surgeon: Gearlean Alf, MD;  Location: [facility];  Service: Orthopedics;  Laterality: Left;  . Knee closed reduction Left 02/15/2013    Procedure: CLOSED MANIPULATION LEFT KNEE;  Surgeon: Gearlean Alf, MD;  Location: [facility];  Service: Orthopedics;  Laterality: Left;  . Heel spur surgery    . Joint replacement Left 2013    knee  . Knee arthrotomy Left 05/29/2013    Procedure: LEFT KNEE ARTHROTOMY WITH SCAR EXCISION;  Surgeon: Gearlean Alf, MD;  Location: [facility];  Service: Orthopedics;  Laterality: Left;     Current outpatient prescriptions: acetaminophen (TYLENOL) 500 MG tablet, Take 500 mg by mouth every 6 (six) hours as needed for pain., Disp: , Rfl: ;   albuterol (VENTOLIN HFA) 108 (90 BASE) MCG/ACT inhaler, Inhale 2 puffs into the lungs every 6 (six) hours as needed for wheezing or shortness of breath., Disp: , Rfl: ;  hydrochlorothiazide (HYDRODIURIL) 25 MG tablet, Take 25 mg by mouth every morning., Disp: , Rfl:  meclizine (ANTIVERT) 25 MG tablet, Take 25 mg by mouth 3 (three) times daily as needed for dizziness. , Disp: , Rfl: ;   traMADol (ULTRAM) 50 MG tablet, Take 1 tablet (50 mg total) by mouth every 6 (six) hours as needed (mild to moderate pain)., Disp: 80 tablet, Rfl: 1  Allergies  Allergen Reactions  . Oxycodone Other (See Comments)    vertigo    History  Substance Use Topics  . Smoking status: Never  Smoker   . Smokeless tobacco: Never Used  . Alcohol Use: No    Family History  Drug / Alcohol Addiction father and grandmother fathers side Hypertension mother, sister, grandfather mothers side, grandmother fathers side and grandfather fathers side Congestive Heart Failure First Degree Relatives. child Cancer Father. father and grandmother mothers side   Review of Systems  Constitutional: Negative.   HENT: Positive for tinnitus. Negative for congestion, ear discharge, ear pain, hearing loss, nosebleeds and sore throat.   Eyes: Negative.   Respiratory: Positive for shortness of breath. Negative for cough, hemoptysis, sputum production, wheezing and stridor.   Cardiovascular: Negative.   Gastrointestinal: Negative.   Genitourinary: Negative.   Musculoskeletal: Positive for joint pain. Negative for back pain, falls, myalgias and neck pain.       Left knee pain and stiffness  Skin: Negative.   Neurological: Negative.  Negative for headaches.  Endo/Heme/Allergies: Negative.   Psychiatric/Behavioral: Negative.      Objective:  Physical Exam  Constitutional: She is oriented to person, place, and time. She appears well-developed and well-nourished. No distress.  HENT:  Head: Normocephalic and atraumatic.  Right Ear: External ear normal.  Left Ear: External ear normal.  Nose: Nose normal.  Mouth/Throat: Oropharynx is clear and moist.  Eyes: Conjunctivae and EOM are normal.  Neck: Normal range of motion. Neck supple.  Cardiovascular: Normal rate, regular rhythm, normal heart sounds and intact distal pulses.   No murmur heard. Respiratory: Effort normal and breath sounds normal. No respiratory distress. She has no wheezes.  GI: Soft. Bowel sounds are normal. She exhibits no distension. There is no tenderness.  Musculoskeletal:       Right hip: Normal.       Left hip: Normal.       Right knee: Normal.       Left knee: She exhibits decreased range of motion and swelling. She  exhibits no effusion and no erythema. Tenderness found. Medial joint line and lateral joint line tenderness noted.       Right lower leg: She exhibits no tenderness and no swelling.       Left lower leg: She exhibits no tenderness and no swelling.  Evaluation of her left knee shows no effusion. There is no warmth. Range of motion is about 25-80. There is no instability.   Neurological: She is alert and oriented to person, place, and time. She has normal strength and normal reflexes. No sensory deficit.  Skin: No rash noted. She is not diaphoretic. No erythema.  Psychiatric: She has a normal mood and affect. Her behavior is normal.   Vitals Weight: 221 lb Height: 69in Body Surface Area: 2.21 m Body Mass Index: 32.64 kg/m  Pulse: 72 (Regular)  BP: 120/78 (Sitting, Left Arm, Standard)  Imaging Review Plain radiographs demonstrate severe degenerative joint disease of the left knee(s). The overall alignment is neutral.There is no evidence of loosening of the femoral, tibial and patellar components. The bone quality appears to be good for age and reported activity level.  Assessment/Plan:  End stage arthritis, left knee(s) with failed previous arthroplasty.   The patient history, physical examination, clinical judgment of the provider and imaging studies are consistent with end stage degenerative joint disease of the left knee(s), previous total knee arthroplasty. Revision total knee arthroplasty is deemed medically necessary. The treatment options including medical management, injection therapy, arthroscopy and revision arthroplasty were discussed at length. The risks and benefits of revision total knee arthroplasty were presented and reviewed. The risks due to aseptic loosening, infection, stiffness, patella tracking problems, thromboembolic complications and other imponderables were discussed. The patient acknowledged the explanation, agreed to proceed with the plan and consent was  signed. Patient is being admitted for inpatient treatment for surgery, pain control, PT, OT, prophylactic antibiotics, VTE prophylaxis, progressive ambulation and ADL's and discharge planning.The patient is planning to be discharged to skilled nursing facility  [place], PA-C

## 2014-03-01 ENCOUNTER — Ambulatory Visit: Payer: Self-pay | Admitting: Family Medicine

## 2014-03-05 ENCOUNTER — Encounter (HOSPITAL_COMMUNITY)
Admission: RE | Disposition: A | Discharge status: Skilled Nursing Facility | Payer: Self-pay | Source: Ambulatory Visit | Attending: Orthopedic Surgery

## 2014-03-05 ENCOUNTER — Inpatient Hospital Stay (HOSPITAL_COMMUNITY): Payer: No Typology Code available for payment source | Admitting: Certified Registered"

## 2014-03-05 ENCOUNTER — Encounter (HOSPITAL_COMMUNITY): Payer: Self-pay | Admitting: *Deleted

## 2014-03-05 ENCOUNTER — Inpatient Hospital Stay (HOSPITAL_COMMUNITY)
Admission: RE | Admit: 2014-03-05 | Discharge: 2014-03-07 | DRG: 465 | Disposition: A | Discharge status: Skilled Nursing Facility | Payer: No Typology Code available for payment source | Source: Ambulatory Visit | Attending: Orthopedic Surgery | Admitting: Orthopedic Surgery

## 2014-03-05 DIAGNOSIS — M25562 Pain in left knee: Secondary | ICD-10-CM | POA: Diagnosis present

## 2014-03-05 DIAGNOSIS — M4322 Fusion of spine, cervical region: Secondary | ICD-10-CM | POA: Diagnosis present

## 2014-03-05 DIAGNOSIS — L905 Scar conditions and fibrosis of skin: Secondary | ICD-10-CM | POA: Diagnosis present

## 2014-03-05 DIAGNOSIS — M24662 Ankylosis, left knee: Secondary | ICD-10-CM | POA: Diagnosis present

## 2014-03-05 DIAGNOSIS — Z6833 Body mass index (BMI) 33.0-33.9, adult: Secondary | ICD-10-CM

## 2014-03-05 DIAGNOSIS — Z96659 Presence of unspecified artificial knee joint: Secondary | ICD-10-CM

## 2014-03-05 DIAGNOSIS — I10 Essential (primary) hypertension: Secondary | ICD-10-CM | POA: Diagnosis present

## 2014-03-05 DIAGNOSIS — Z96652 Presence of left artificial knee joint: Secondary | ICD-10-CM | POA: Diagnosis present

## 2014-03-05 DIAGNOSIS — M24669 Ankylosis, unspecified knee: Secondary | ICD-10-CM | POA: Diagnosis present

## 2014-03-05 DIAGNOSIS — M25669 Stiffness of unspecified knee, not elsewhere classified: Secondary | ICD-10-CM

## 2014-03-05 DIAGNOSIS — T8489XD Other specified complication of internal orthopedic prosthetic devices, implants and grafts, subsequent encounter: Secondary | ICD-10-CM

## 2014-03-05 HISTORY — PX: KNEE ARTHROTOMY: SHX5881

## 2014-03-05 LAB — TYPE AND SCREEN
ABO/RH(D): O POS
ANTIBODY SCREEN: NEGATIVE

## 2014-03-05 SURGERY — ARTHROTOMY, KNEE
Anesthesia: Monitor Anesthesia Care | Site: Knee | Laterality: Left

## 2014-03-05 MED ORDER — LACTATED RINGERS IV SOLN
INTRAVENOUS | Status: DC
  Administered 2014-03-05: 1000 mL via INTRAVENOUS
  Administered 2014-03-05 (×2): via INTRAVENOUS

## 2014-03-05 MED ORDER — DEXTROSE 5 % IV SOLN
500.0000 mg | Freq: Four times a day (QID) | INTRAVENOUS | Status: DC | PRN
  Administered 2014-03-05: 500 mg via INTRAVENOUS
  Filled 2014-03-05 (×2): qty 5

## 2014-03-05 MED ORDER — PHENYLEPHRINE HCL 10 MG/ML IJ SOLN
INTRAMUSCULAR | Status: DC | PRN
  Administered 2014-03-05 (×3): 80 ug via INTRAVENOUS
  Administered 2014-03-05 (×2): 40 ug via INTRAVENOUS
  Administered 2014-03-05: 80 ug via INTRAVENOUS
  Administered 2014-03-05 (×2): 40 ug via INTRAVENOUS
  Administered 2014-03-05 (×2): 80 ug via INTRAVENOUS

## 2014-03-05 MED ORDER — CEFAZOLIN SODIUM-DEXTROSE 2-3 GM-% IV SOLR: INTRAVENOUS | Status: DC | PRN

## 2014-03-05 MED ORDER — MIDAZOLAM HCL 5 MG/5ML IJ SOLN
INTRAMUSCULAR | Status: DC | PRN
  Administered 2014-03-05: 2 mg via INTRAVENOUS

## 2014-03-05 MED ORDER — ACETAMINOPHEN 10 MG/ML IV SOLN
1000.0000 mg | Freq: Once | INTRAVENOUS | Status: AC
  Administered 2014-03-05: 1000 mg via INTRAVENOUS
  Filled 2014-03-05: qty 100

## 2014-03-05 MED ORDER — PROPOFOL 10 MG/ML IV BOLUS
INTRAVENOUS | Status: AC
  Filled 2014-03-05: qty 20

## 2014-03-05 MED ORDER — MORPHINE SULFATE 2 MG/ML IJ SOLN
1.0000 mg | INTRAMUSCULAR | Status: DC | PRN
  Administered 2014-03-06: 1 mg via INTRAVENOUS
  Filled 2014-03-05: qty 1

## 2014-03-05 MED ORDER — FENTANYL CITRATE 0.05 MG/ML IJ SOLN
INTRAMUSCULAR | Status: DC | PRN
  Administered 2014-03-05: 100 ug via INTRAVENOUS

## 2014-03-05 MED ORDER — PHENYLEPHRINE 40 MCG/ML (10ML) SYRINGE FOR IV PUSH (FOR BLOOD PRESSURE SUPPORT)
PREFILLED_SYRINGE | INTRAVENOUS | Status: AC
  Filled 2014-03-05: qty 10

## 2014-03-05 MED ORDER — PROPOFOL 10 MG/ML IV BOLUS
INTRAVENOUS | Status: AC
Start: 2014-03-05 — End: 2014-03-05
  Filled 2014-03-05: qty 20

## 2014-03-05 MED ORDER — CEFAZOLIN SODIUM-DEXTROSE 2-3 GM-% IV SOLR
2.0000 g | INTRAVENOUS | Status: AC
  Administered 2014-03-05: 2 g via INTRAVENOUS

## 2014-03-05 MED ORDER — METOCLOPRAMIDE HCL 10 MG PO TABS: 5.0000 mg | ORAL_TABLET | Freq: Three times a day (TID) | ORAL | Status: DC | PRN

## 2014-03-05 MED ORDER — CEFAZOLIN SODIUM-DEXTROSE 2-3 GM-% IV SOLR
2.0000 g | Freq: Four times a day (QID) | INTRAVENOUS | Status: AC
  Administered 2014-03-05 – 2014-03-06 (×3): 2 g via INTRAVENOUS
  Filled 2014-03-05 (×3): qty 50

## 2014-03-05 MED ORDER — METOCLOPRAMIDE HCL 5 MG/ML IJ SOLN: 5.0000 mg | Freq: Three times a day (TID) | INTRAMUSCULAR | Status: DC | PRN

## 2014-03-05 MED ORDER — PROPOFOL INFUSION 10 MG/ML OPTIME
INTRAVENOUS | Status: DC | PRN
  Administered 2014-03-05: 75 ug/kg/min via INTRAVENOUS

## 2014-03-05 MED ORDER — SODIUM CHLORIDE 0.9 % IV SOLN: INTRAVENOUS | Status: DC

## 2014-03-05 MED ORDER — ONDANSETRON HCL 4 MG/2ML IJ SOLN
INTRAMUSCULAR | Status: DC | PRN
  Administered 2014-03-05: 4 mg via INTRAVENOUS

## 2014-03-05 MED ORDER — SODIUM CHLORIDE 0.9 % IJ SOLN
INTRAMUSCULAR | Status: AC
  Filled 2014-03-05: qty 50

## 2014-03-05 MED ORDER — EPHEDRINE SULFATE 50 MG/ML IJ SOLN
INTRAMUSCULAR | Status: AC
  Filled 2014-03-05: qty 1

## 2014-03-05 MED ORDER — CEFAZOLIN SODIUM-DEXTROSE 2-3 GM-% IV SOLR
INTRAVENOUS | Status: AC
  Filled 2014-03-05: qty 50

## 2014-03-05 MED ORDER — SODIUM CHLORIDE 0.9 % IJ SOLN
INTRAMUSCULAR | Status: DC | PRN
  Administered 2014-03-05: 20 mL

## 2014-03-05 MED ORDER — TRAMADOL HCL 50 MG PO TABS
50.0000 mg | ORAL_TABLET | Freq: Four times a day (QID) | ORAL | Status: DC | PRN
Start: 2014-03-05 — End: 2014-03-07
  Administered 2014-03-06: 100 mg via ORAL
  Filled 2014-03-05: qty 2

## 2014-03-05 MED ORDER — BISACODYL 10 MG RE SUPP: 10.0000 mg | Freq: Every day | RECTAL | Status: DC | PRN

## 2014-03-05 MED ORDER — FENTANYL CITRATE 0.05 MG/ML IJ SOLN
INTRAMUSCULAR | Status: AC
  Filled 2014-03-05: qty 2

## 2014-03-05 MED ORDER — HYDROMORPHONE HCL 2 MG PO TABS
2.0000 mg | ORAL_TABLET | ORAL | Status: DC | PRN
  Administered 2014-03-05: 2 mg via ORAL
  Administered 2014-03-06 – 2014-03-07 (×4): 4 mg via ORAL
  Filled 2014-03-05: qty 1
  Filled 2014-03-05 (×4): qty 2

## 2014-03-05 MED ORDER — ENOXAPARIN SODIUM 40 MG/0.4ML ~~LOC~~ SOLN
40.0000 mg | SUBCUTANEOUS | Status: DC
  Administered 2014-03-06 – 2014-03-07 (×2): 40 mg via SUBCUTANEOUS
  Filled 2014-03-05 (×3): qty 0.4

## 2014-03-05 MED ORDER — ONDANSETRON HCL 4 MG/2ML IJ SOLN: 4.0000 mg | Freq: Four times a day (QID) | INTRAMUSCULAR | Status: DC | PRN

## 2014-03-05 MED ORDER — BUPIVACAINE HCL (PF) 0.25 % IJ SOLN
INTRAMUSCULAR | Status: AC
  Filled 2014-03-05: qty 30

## 2014-03-05 MED ORDER — DOCUSATE SODIUM 100 MG PO CAPS
100.0000 mg | ORAL_CAPSULE | Freq: Two times a day (BID) | ORAL | Status: DC
  Administered 2014-03-05 – 2014-03-06 (×3): 100 mg via ORAL

## 2014-03-05 MED ORDER — CHLORHEXIDINE GLUCONATE 4 % EX LIQD: 60.0000 mL | Freq: Once | CUTANEOUS | Status: DC

## 2014-03-05 MED ORDER — FLEET ENEMA 7-19 GM/118ML RE ENEM: 1.0000 | ENEMA | Freq: Once | RECTAL | Status: AC | PRN

## 2014-03-05 MED ORDER — BUPIVACAINE LIPOSOME 1.3 % IJ SUSP
20.0000 mL | Freq: Once | INTRAMUSCULAR | Status: DC
  Filled 2014-03-05: qty 20

## 2014-03-05 MED ORDER — HYDROCHLOROTHIAZIDE 25 MG PO TABS
25.0000 mg | ORAL_TABLET | Freq: Every morning | ORAL | Status: DC
  Administered 2014-03-06: 25 mg via ORAL
  Filled 2014-03-05 (×2): qty 1

## 2014-03-05 MED ORDER — BUPIVACAINE LIPOSOME 1.3 % IJ SUSP
INTRAMUSCULAR | Status: DC | PRN
  Administered 2014-03-05: 20 mL

## 2014-03-05 MED ORDER — SODIUM CHLORIDE 0.9 % IJ SOLN
INTRAMUSCULAR | Status: AC
  Filled 2014-03-05: qty 10

## 2014-03-05 MED ORDER — LIDOCAINE HCL (CARDIAC) 20 MG/ML IV SOLN
INTRAVENOUS | Status: DC | PRN
  Administered 2014-03-05: 50 mg via INTRAVENOUS

## 2014-03-05 MED ORDER — BUPIVACAINE HCL 0.25 % IJ SOLN
INTRAMUSCULAR | Status: DC | PRN
  Administered 2014-03-05: 30 mL

## 2014-03-05 MED ORDER — PROPOFOL 10 MG/ML IV BOLUS
INTRAVENOUS | Status: DC | PRN
  Administered 2014-03-05: 30 mg via INTRAVENOUS

## 2014-03-05 MED ORDER — SODIUM CHLORIDE 0.9 % IR SOLN
Status: DC | PRN
  Administered 2014-03-05: 1

## 2014-03-05 MED ORDER — HYDROMORPHONE HCL 1 MG/ML IJ SOLN: 0.2500 mg | INTRAMUSCULAR | Status: DC | PRN

## 2014-03-05 MED ORDER — DEXAMETHASONE SODIUM PHOSPHATE 10 MG/ML IJ SOLN
10.0000 mg | Freq: Once | INTRAMUSCULAR | Status: AC
  Administered 2014-03-05: 10 mg via INTRAVENOUS

## 2014-03-05 MED ORDER — 0.9 % SODIUM CHLORIDE (POUR BTL) OPTIME
TOPICAL | Status: DC | PRN
  Administered 2014-03-05: 1000 mL

## 2014-03-05 MED ORDER — MECLIZINE HCL 25 MG PO TABS
25.0000 mg | ORAL_TABLET | Freq: Three times a day (TID) | ORAL | Status: DC | PRN
  Filled 2014-03-05: qty 1

## 2014-03-05 MED ORDER — ONDANSETRON HCL 4 MG/2ML IJ SOLN
INTRAMUSCULAR | Status: AC
  Filled 2014-03-05: qty 2

## 2014-03-05 MED ORDER — DEXAMETHASONE SODIUM PHOSPHATE 10 MG/ML IJ SOLN
INTRAMUSCULAR | Status: AC
  Filled 2014-03-05: qty 1

## 2014-03-05 MED ORDER — STERILE WATER FOR IRRIGATION IR SOLN
Status: DC | PRN
  Administered 2014-03-05: 1500 mL

## 2014-03-05 MED ORDER — DEXTROSE-NACL 5-0.9 % IV SOLN
INTRAVENOUS | Status: DC
  Administered 2014-03-05 – 2014-03-06 (×2): via INTRAVENOUS

## 2014-03-05 MED ORDER — POLYETHYLENE GLYCOL 3350 17 G PO PACK: 17.0000 g | PACK | Freq: Every day | ORAL | Status: DC | PRN

## 2014-03-05 MED ORDER — MIDAZOLAM HCL 2 MG/2ML IJ SOLN
INTRAMUSCULAR | Status: AC
  Filled 2014-03-05: qty 2

## 2014-03-05 MED ORDER — ALBUTEROL SULFATE (2.5 MG/3ML) 0.083% IN NEBU: 3.0000 mL | INHALATION_SOLUTION | Freq: Four times a day (QID) | RESPIRATORY_TRACT | Status: DC | PRN

## 2014-03-05 MED ORDER — ATROPINE SULFATE 0.4 MG/ML IJ SOLN
INTRAMUSCULAR | Status: AC
  Filled 2014-03-05: qty 1

## 2014-03-05 MED ORDER — LIDOCAINE HCL (CARDIAC) 20 MG/ML IV SOLN
INTRAVENOUS | Status: AC
  Filled 2014-03-05: qty 5

## 2014-03-05 MED ORDER — ONDANSETRON HCL 4 MG PO TABS: 4.0000 mg | ORAL_TABLET | Freq: Four times a day (QID) | ORAL | Status: DC | PRN

## 2014-03-05 MED ORDER — METHOCARBAMOL 500 MG PO TABS
500.0000 mg | ORAL_TABLET | Freq: Four times a day (QID) | ORAL | Status: DC | PRN
  Administered 2014-03-06 – 2014-03-07 (×3): 500 mg via ORAL
  Filled 2014-03-05 (×3): qty 1

## 2014-03-05 SURGICAL SUPPLY — 39 items
BAG ZIPLOCK 12X15 (MISCELLANEOUS) ×3 IMPLANT
BANDAGE ELASTIC 6 VELCRO ST LF (GAUZE/BANDAGES/DRESSINGS) ×3 IMPLANT
BANDAGE ESMARK 6X9 LF (GAUZE/BANDAGES/DRESSINGS) ×1 IMPLANT
BNDG ESMARK 6X9 LF (GAUZE/BANDAGES/DRESSINGS) ×3
CLOSURE STERI-STRIP 1/4X4 (GAUZE/BANDAGES/DRESSINGS) ×3 IMPLANT
CLOSURE WOUND 1/2 X4 (GAUZE/BANDAGES/DRESSINGS) ×2
CUFF TOURN SGL QUICK 34 (TOURNIQUET CUFF) ×2
CUFF TRNQT CYL 34X4X40X1 (TOURNIQUET CUFF) ×1 IMPLANT
DRAPE EXTREMITY TIBURON (DRAPES) ×3 IMPLANT
DRAPE U-SHAPE 47X51 STRL (DRAPES) ×3 IMPLANT
DRSG ADAPTIC 3X8 NADH LF (GAUZE/BANDAGES/DRESSINGS) ×3 IMPLANT
DRSG PAD ABDOMINAL 8X10 ST (GAUZE/BANDAGES/DRESSINGS) ×6 IMPLANT
DURAPREP 26ML APPLICATOR (WOUND CARE) ×3 IMPLANT
ELECT REM PT RETURN 9FT ADLT (ELECTROSURGICAL) ×3
ELECTRODE REM PT RTRN 9FT ADLT (ELECTROSURGICAL) ×1 IMPLANT
EVACUATOR 1/8 PVC DRAIN (DRAIN) ×6 IMPLANT
GAUZE SPONGE 4X4 12PLY STRL (GAUZE/BANDAGES/DRESSINGS) ×3 IMPLANT
GLOVE BIO SURGEON STRL SZ7.5 (GLOVE) ×3 IMPLANT
GLOVE BIO SURGEON STRL SZ8 (GLOVE) ×3 IMPLANT
GLOVE BIOGEL PI IND STRL 8 (GLOVE) ×3 IMPLANT
GLOVE BIOGEL PI INDICATOR 8 (GLOVE) ×6
GOWN STRL REUS W/TWL LRG LVL3 (GOWN DISPOSABLE) ×3 IMPLANT
GOWN STRL REUS W/TWL XL LVL3 (GOWN DISPOSABLE) ×3 IMPLANT
IMMOBILIZER KNEE 22 UNIV (SOFTGOODS) ×3 IMPLANT
INSERT TIBIAL TC3 15.0 (Knees) ×3 IMPLANT
KIT BASIN OR (CUSTOM PROCEDURE TRAY) ×3 IMPLANT
MANIFOLD NEPTUNE II (INSTRUMENTS) ×3 IMPLANT
PACK TOTAL JOINT (CUSTOM PROCEDURE TRAY) ×3 IMPLANT
PAD ABD 8X10 STRL (GAUZE/BANDAGES/DRESSINGS) ×3 IMPLANT
PADDING CAST COTTON 6X4 STRL (CAST SUPPLIES) ×6 IMPLANT
POSITIONER SURGICAL ARM (MISCELLANEOUS) ×3 IMPLANT
STRIP CLOSURE SKIN 1/2X4 (GAUZE/BANDAGES/DRESSINGS) ×4 IMPLANT
SUT MNCRL AB 4-0 PS2 18 (SUTURE) ×3 IMPLANT
SUT PDS AB 1 CT1 27 (SUTURE) ×3 IMPLANT
SUT VIC AB 2-0 CT1 27 (SUTURE) ×6
SUT VIC AB 2-0 CT1 TAPERPNT 27 (SUTURE) ×3 IMPLANT
SUT VLOC 180 0 24IN GS25 (SUTURE) ×6 IMPLANT
TOWEL OR 17X26 10 PK STRL BLUE (TOWEL DISPOSABLE) ×6 IMPLANT
TRAY FOLEY CATH 14FRSI W/METER (CATHETERS) ×3 IMPLANT

## 2014-03-05 NOTE — Brief Op Note (Signed)
211/06/2013  4:30 PM  PATIENT:  [name] [name]  63 y.o. female  PRE-OPERATIVE DIAGNOSIS:  LEFT KNEE ATHROFIBROSIS  POST-OPERATIVE DIAGNOSIS:  LEFT KNEE ATHROFIBROSIS  PROCEDURE:  Procedure(s): LEFT KNEE ARTHROTOMY/SCAR EXCISION/POLYETHYLENE REVISION (Left)  SURGEON:  Surgeon(s) and Role:    * Gearlean Alf, MD - Primary  PHYSICIAN ASSISTANT:   ASSISTANTS: Arlee Muslim, PA-C   ANESTHESIA:   spinal  EBL:  Total I/O In: 1000 [I.V.:1000] Out: 100 [Urine:50; Blood:50]  DRAINS: (Medium) Hemovact drain(s) in the left knee with  Suction Open   LOCAL MEDICATIONS USED:  OTHER Exparel  COUNTS:  YES  TOURNIQUET:   Total Tourniquet Time Documented: Thigh (Left) - 46 minutes Total: Thigh (Left) - 46 minutes   DICTATION: .Other Dictation: Dictation Number [id number]  PLAN OF CARE: Admit to inpatient   PATIENT DISPOSITION:  PACU - hemodynamically stable.

## 2014-03-05 NOTE — Transfer of Care (Signed)
Immediate Anesthesia Transfer of Care Note  Patient: [name] [name]  Procedure(s) Performed: Procedure(s) (LRB): LEFT KNEE ARTHROTOMY/SCAR EXCISION/POLYETHYLENE REVISION (Left)  Patient Location: PACU  Anesthesia Type: Spinal  Level of Consciousness: sedated, patient cooperative and responds to stimulation  Airway & Oxygen Therapy: Patient Spontanous Breathing and Patient connected to face mask oxgen  Post-op Assessment: Report given to PACU RN and Post -op Vital signs reviewed and stable  Post vital signs: Reviewed and stable  Complications: No apparent anesthesia complications

## 2014-03-05 NOTE — Anesthesia Procedure Notes (Signed)
Spinal Patient location during procedure: OR End time: 03/05/2014 3:08 PM Staffing Resident/CRNA: Noralyn Pick Performed by: anesthesiologist and resident/CRNA  Preanesthetic Checklist Completed: patient identified, site marked, surgical consent, pre-op evaluation, timeout performed, IV checked, risks and benefits discussed and monitors and equipment checked Spinal Block Patient position: sitting Prep: Betadine Patient monitoring: heart rate, continuous pulse ox and blood pressure Approach: right paramedian Location: L3-4 Injection technique: single-shot Needle Needle type: Spinocan  Needle gauge: 22 G Needle length: 9 cm Assessment Sensory level: T6 Additional Notes Expiration date of kit checked and confirmed. Patient tolerated procedure well, without complications.

## 2014-03-05 NOTE — Anesthesia Preprocedure Evaluation (Signed)
Anesthesia Evaluation  Patient identified by MRN, date of birth, ID band Patient awake    Reviewed: Allergy & Precautions, H&P , NPO status , Patient's Chart, lab work & pertinent test results  History of Anesthesia Complications (+) PONV and history of anesthetic complications  Airway Mallampati: I  TM Distance: >3 FB Neck ROM: Full    Dental  (+) Edentulous Upper, Partial Lower,    Pulmonary neg shortness of breath, neg sleep apnea, neg COPD breath sounds clear to auscultation        Cardiovascular hypertension, Rhythm:Regular     Neuro/Psych negative neurological ROS  negative psych ROS   GI/Hepatic negative GI ROS, Neg liver ROS,   Endo/Other  negative endocrine ROS  Renal/GU negative Renal ROS     Musculoskeletal  (+) Arthritis -, Osteoarthritis,    Abdominal   Peds  Hematology negative hematology ROS (+)   Anesthesia Other Findings   Reproductive/Obstetrics                             Anesthesia Physical Anesthesia Plan  ASA: II  Anesthesia Plan: MAC and Spinal   Post-op Pain Management:    Induction:   Airway Management Planned: Natural Airway and Simple Face Mask  Additional Equipment: None  Intra-op Plan:   Post-operative Plan:   Informed Consent: I have reviewed the patients History and Physical, chart, labs and discussed the procedure including the risks, benefits and alternatives for the proposed anesthesia with the patient or authorized representative who has indicated his/her understanding and acceptance.   Dental advisory given  Plan Discussed with: CRNA and Surgeon  Anesthesia Plan Comments:         Anesthesia Quick Evaluation

## 2014-03-05 NOTE — Interval H&P Note (Signed)
History and Physical Interval Note:  03/05/2014 2:47 PM  [name] [name]  has presented today for surgery, with the diagnosis of LEFT KNEE ATHROFIBROSIS  The various methods of treatment have been discussed with the patient and family. After consideration of risks, benefits and other options for treatment, the patient has consented to  Procedure(s): LEFT KNEE ARTHROTOMY/SCAR EXCISION/POSSIBLE FEMORAL REVISION (Left) as a surgical intervention .  The patient's history has been reviewed, patient examined, no change in status, stable for surgery.  I have reviewed the patient's chart and labs.  Questions were answered to the patient's satisfaction.     Gearlean Alf

## 2014-03-05 NOTE — Anesthesia Postprocedure Evaluation (Signed)
  Anesthesia Post-op Note  Patient: [name] [name]  Procedure(s) Performed: Procedure(s): LEFT KNEE ARTHROTOMY/SCAR EXCISION/POLYETHYLENE REVISION (Left)  Patient Location: PACU  Anesthesia Type:Spinal  Level of Consciousness: awake and alert   Airway and Oxygen Therapy: Patient Spontanous Breathing and Patient connected to nasal cannula oxygen  Post-op Pain: none  Post-op Assessment: Post-op Vital signs reviewed, Patient's Cardiovascular Status Stable, Respiratory Function Stable, Patent Airway, No signs of Nausea or vomiting and Pain level controlled  Post-op Vital Signs: Reviewed and stable  Last Vitals:  Filed Vitals:   03/05/14 1828  BP: 118/60  Pulse: 57  Temp: 36.7 C  Resp: 16    Complications: No apparent anesthesia complications

## 2014-03-06 ENCOUNTER — Encounter (HOSPITAL_COMMUNITY): Payer: Self-pay | Admitting: Orthopedic Surgery

## 2014-03-06 LAB — CBC
HCT: 36.7 % (ref 36.0–46.0)
HEMOGLOBIN: 11.8 g/dL — AB (ref 12.0–15.0)
MCH: 27.6 pg (ref 26.0–34.0)
MCHC: 32.2 g/dL (ref 30.0–36.0)
MCV: 85.7 fL (ref 78.0–100.0)
Platelets: 323 10*3/uL (ref 150–400)
RBC: 4.28 MIL/uL (ref 3.87–5.11)
RDW: 13.8 % (ref 11.5–15.5)
WBC: 11.5 10*3/uL — AB (ref 4.0–10.5)

## 2014-03-06 NOTE — Care Management Note (Signed)
    Page 1 of 1   03/06/2014     3:12:17 PM CARE MANAGEMENT NOTE 03/06/2014  Patient:  [name] [name],[name] [name]   Account Number:  [id number]  Date Initiated:  03/06/2014  Documentation initiated by:  [facility]  Subjective/Objective Assessment:   adm: LEFT KNEE ARTHROTOMY/SCAR EXCISION/POLYETHYLENE REVISION (Left)     Action/Plan:   [place] planning   Anticipated DC Date:  03/06/2014   Anticipated DC Plan:  [place]  CM consult      Choice offered to / List presented to:             Status of service:  Completed, signed off Medicare Important Message given?   (If response is "NO", the following Medicare IM given date fields will be blank) Date Medicare IM given:   Medicare IM given by:   Date Additional Medicare IM given:   Additional Medicare IM given by:    Discharge Disposition:  [place]  Per UR Regulation:    If discussed at Long Length of Stay Meetings, dates discussed:    Comments:  03/06/14 15:10 CM notes pt to go to SNF to rehab; CSW arranging.  No other CM needs were communicated.  Mariane Masters, BSN, [place].

## 2014-03-06 NOTE — Progress Notes (Signed)
Clinical Social Work Department CLINICAL SOCIAL WORK PLACEMENT NOTE 03/06/2014  Patient:  Dedrick,Ikesha A  Account Number:  [id number] [place] date:  03/05/2014  Clinical Social Worker:  Werner Lean, LCSW  Date/time:  03/06/2014 02:58 PM  Clinical Social Work is seeking post-discharge placement for this patient at the following level of care:   SKILLED NURSING   (*CSW will update this form in Epic as items are completed)     Patient/family provided with [place] Department of Clinical Social Work's list of facilities offering this level of care within the geographic area requested by the patient (or if unable, by the patient's family).  03/06/2014  Patient/family informed of their freedom to choose among providers that offer the needed level of care, that participate in Medicare, Medicaid or managed care program needed by the patient, have an available bed and are willing to accept the patient.    Patient/family informed of [facility]' ownership interest in [facility], as well as of the fact that they are under no obligation to receive care at this facility.  PASARR submitted to EDS on 03/06/2014 PASARR number received on 03/06/2014  FL2 transmitted to all facilities in geographic area requested by pt/family on  03/06/2014 FL2 transmitted to all facilities within larger geographic area on   Patient informed that his/her managed care company has contracts with or will negotiate with  certain facilities, including the following:     Patient/family informed of bed offers received:  03/06/2014 Patient chooses bed at [facility] Physician recommends and patient chooses bed at    Patient to be transferred to  on   Patient to be transferred to facility by  Patient and family notified of transfer on  Name of family member notified:    The following physician request were entered in Epic:   Additional Comments:   Werner Lean LCSW [phone]

## 2014-03-06 NOTE — Progress Notes (Signed)
Clinical Social Work Department BRIEF PSYCHOSOCIAL ASSESSMENT 03/06/2014  Patient:  Hauter,Kazoua A     Account Number:  [id number]     [place] date:  03/05/2014  Clinical Social Worker:  Lacie Scotts  Date/Time:  03/06/2014 01:33 PM  Referred by:  Physician  Date Referred:  03/06/2014 Referred for  SNF Placement   Other Referral:   Interview type:  Patient Other interview type:    PSYCHOSOCIAL DATA Living Status:  ALONE Admitted from facility:   Level of care:   Primary support name:  Serita Sheller Primary support relationship to patient:  SIBLING Degree of support available:   unclear    CURRENT CONCERNS Current Concerns  Post-Acute Placement   Other Concerns:    SOCIAL WORK ASSESSMENT / PLAN Pt is a 63 yr old female living at home prior to hospitalization. CSW met with pt to assist with d/c planning. This is a planned admission. Pt has made prior arrangements to have ST Rehab at [facility] following hospital d/c. CSW has contacted SNF and d/c plan has been confirmed pending [employer]. CSW has requested authorization from [place] and a decision is pending.   Assessment/plan status:  Psychosocial Support/Ongoing Assessment of Needs Other assessment/ plan:   Information/referral to community resources:   Insurance coverage for SNF and ambulance transport reviewed.    PATIENT'S/FAMILY'S RESPONSE TO PLAN OF CARE: Pt's mood is bright. She has begun working with PT and is progressing well. Pt is looking forward to having rehab at [facility].   Werner Lean LCSW [phone]

## 2014-03-06 NOTE — Op Note (Signed)
NAMEMCKENZY, SALAZAR            ACCOUNT NO.:  [id number]  MEDICAL RECORD NO.:  [id number]  LOCATION:  48                         FACILITY:  [facility]  PHYSICIAN:  Gaynelle Arabian, M.D.    DATE OF BIRTH:  1950/10/28  DATE OF PROCEDURE:  03/05/2014 DATE OF DISCHARGE:                              OPERATIVE REPORT   PREOPERATIVE DIAGNOSIS:  Arthrofibrosis, left knee.  POSTOPERATIVE DIAGNOSIS:  Arthrofibrosis, left knee.  PROCEDURE:  Left knee arthrotomy with scar excision and polyethylene revision.  SURGEON:  Gaynelle Arabian, MD  ASSISTANT:  [name] L. Perkins, PA-C  ANESTHESIA:  Spinal.  ESTIMATED BLOOD LOSS:  Minimal.  DRAINS:  Hemovac x2.  TOURNIQUET TIME:  46 minutes at 300 mmHg.  COMPLICATIONS:  None.  CONDITION:  Stable to recovery.  BRIEF CLINICAL NOTE:  [name] [name] is a 63 year old female, complex history in regards to her left knee.  I saw her with a painful stiff knee with periprosthetic loosening.  She had a revision and has had significant scarring.  She had a closed manipulation with minimal improvement.  She has range of motion about 30-70 degrees.  She presents now for arthrotomy, scar excision, and possible revision.  PROCEDURE IN DETAIL:  After successful administration of spinal anesthetic, tourniquet was placed high on the left thigh and left lower extremity prepped and draped in the usual sterile fashion.  Extremities wrapped in Esmarch.  Tourniquet inflated to 300 mmHg.  A midline incision made with a 10 blade in subcutaneous tissue.  She had significant scarring in the subcutaneous tissue.  I had to dissect this from the underlying tissue and create medial and lateral pouches as well as a pouch above the patella.  This effectively de-bonded the subcu from the deep tissue.  I then took a fresh blade and made a medial parapatellar arthrotomy.  There was no fluid in joint but tremendous scarring.  We started medially and excised the scar from  the undersurface of the extensor mechanism.  Cobb elevator was used to elevate the tissue off the femur and recreate the medial gutter.  The soft tissue on the proximal medial tibia was then subperiosteally elevated to the joint line with a knife and the semimembranosus bursa with a Cobb elevator.  There was actually scar tissue that has infiltrated over the tibial polyethylene and we excised all that.  I then performed the same on the lateral side with attention being paid to avoiding disrupting the patellar tendon from tibial tubercle.  There was also a tremendous focus of heterotopic bone on the anterior cortex of the femur above the femoral component.  I removed this with an osteotome.  This got back to a normal femur.  The patellar component was completely covered by tissue, and once I removed the tissue, it was complete covered by bone.  Once I removed the bone, we were able to visualize the patellar component which remained well fixed in good position and no wear.  I then flexed the knee.  I was able to flex to about 115 at this point.  We subluxed the tibia forward to remove the tibial polyethylene which was 17.5 mm thick.  There was tremendous scarring posterior to the joint.  Cobb elevator was used to elevate the scar off the medial and lateral condyles posteriorly.  I carefully dissected some of the posterior scar off some excess bone that had formed posteriorly.  I was able to remove this bone.  This was all done with the knee in flexion so as to avoid the neurovascular bundle.  We then placed a trial 15-mm insert which actually allowed her to get the full extension, and I was able to flex to about 120.  There was a tiny bit of AP laxity, but I felt it was definitely acceptable in order to gain the extension.  I felt we did not need to revise the femoral component.  We thoroughly irrigated the joint with 3 L of saline using pulsatile lavage.  A 15-mm TC3 rotating platform  insert was placed into the tibial tray.  This was a size 3 to match our femur.  Wound was further irrigated.  The arthrotomy was closed over a Hemovac drain after I injected the periosteum of the femur as well as the extensor mechanism with 20 mL of Exparel mixed with 40 mL of saline.  Additional 20 mL of 0.25% Marcaine was injected into the same tissues.  The arthrotomy was closed over Hemovac drain with a running #1 V-Loc suture as well as interrupted #1 PDS suture.  Flexion against gravity was 100 degrees.  We came within 5 degrees of full extension.  Tourniquet was released for a total time of 46 minutes.  We placed a second Hemovac drain in the subcu tissues and closed that with interrupted 2-0 Vicryl.  The skin was closed with staples.  Drains were hooked to suction.  Incision was cleaned and dried, and a bulky sterile dressing applied.  She was placed into a knee immobilizer, awakened and transferred to recovery in stable condition.  Note, that a surgical assistant was a medical necessity for this procedure to do it in a safe and expeditious manner.  Surgical assistant was necessary for retraction of vital neurovascular structures and also protection of those structures while removing the scar tissue.     Gaynelle Arabian, M.D.     FA/MEDQ  D:  03/05/2014  T:  03/06/2014  Job:  [id number]

## 2014-03-06 NOTE — Progress Notes (Signed)
Physical Therapy Treatment Patient Details Name: [name] [name] MRN: [id number] DOB: 07-27-1950 Today's Date: 03/06/2014    History of Present Illness Pt is a 63 year old female s/p Left knee arthrotomy with scar excision and polyethylene revision    PT Comments    POD # 1 pm session.  Applied KI and instructed pt on use for amb.  This is pt's 4th L knee surgery and is very knowledgeable. Assisted pt out of recliner to amb in hallway.  Unsteady gait and impulsive pt required cueing for safety.  HIGH FALL RISK.  Assisted to bathroom then back to bed to perform TKR TE's followed by ICE.   Pt lives alone and demonstrates decreased safety cognition and unsteady gait.  Will need ST Rehab at SNF.  Follow Up Recommendations  SNF     Equipment Recommendations  None recommended by PT    Recommendations for Other Services       Precautions / Restrictions Precautions Precautions: Knee Precaution Comments: instructed pt on use of KI Required Braces or Orthoses: Knee Immobilizer - Left Knee Immobilizer - Left: Discontinue once straight leg raise with < 10 degree lag Restrictions Weight Bearing Restrictions: No Other Position/Activity Restrictions: WBAT    Mobility  Bed Mobility Overal bed mobility: Needs Assistance Bed Mobility: Sit to Supine           General bed mobility comments: assist with L LE up onto bed  Transfers Overall transfer level: Needs assistance Equipment used: Lofstrands Transfers: Sit to/from Stand Sit to Stand: Min guard         General transfer comment: verbal cues for UE and LE positioning.  Pt a little impulsive.  Ambulation/Gait Ambulation/Gait assistance: Min guard;Supervision Ambulation Distance (Feet): 225 Feet Assistive device: Lofstrands Gait Pattern/deviations: Step-through pattern;Antalgic Gait velocity: WFL   General Gait Details: VC's for safety esp with turns.  Unsteady gait with decreased WB tolerance L LE.  HIGH FALL  RISK.   Stairs            Wheelchair Mobility    Modified Rankin (Stroke Patients Only)       Balance                                    Cognition                            Exercises Total Joint Exercises Ankle Circles/Pumps: AROM;Both;15 reps Quad Sets: AROM;Both;15 reps Short Arc QuadSinclair [name];Left;15 reps Heel Slides: AAROM;Left;15 reps Hip ABduction/ADduction: AROM;Left;15 reps Straight Leg Raises: AAROM;Left;10 reps    General Comments        Pertinent Vitals/Pain Pain Assessment: No/denies pain Pain Intervention(s): Monitored during session;Repositioned    Home Living                      Prior Function            PT Goals (current goals can now be found in the care plan section) Acute Rehab PT Goals PT Goal Formulation: With patient Time For Goal Achievement: 03/10/14 Potential to Achieve Goals: Good Progress towards PT goals: Progressing toward goals    Frequency  7X/week    PT Plan      Co-evaluation             End of Session Equipment Utilized During Treatment: Left knee immobilizer (pt refused  gait belt) Activity Tolerance: Patient limited by fatigue Patient left: in bed;with call bell/phone within reach     Time: 1356-1434 PT Time Calculation (min): 38 min  Charges:  $Gait Training: 8-22 mins $Therapeutic Exercise: 8-22 mins $Therapeutic Activity: 8-22 mins                    G Codes:      [name] [name]  PTA [facility]  Acute  Rehab Pager      [phone]

## 2014-03-06 NOTE — Plan of Care (Signed)
Problem: Phase I Progression Outcomes Goal: CMS/Neurovascular status WDL Outcome: Completed/Met Date Met:  03/06/14 Goal: Pain controlled with appropriate interventions Outcome: Completed/Met Date Met:  03/06/14 Goal: Dangle or out of bed evening of surgery Outcome: Completed/Met Date Met:  03/06/14 Goal: Initial discharge plan identified Outcome: Completed/Met Date Met:  03/06/14 Goal: Hemodynamically stable Outcome: Completed/Met Date Met:  03/06/14     

## 2014-03-06 NOTE — Evaluation (Signed)
Physical Therapy Evaluation Patient Details Name: [name] [name] MRN: [id number] DOB: 1950/10/14 Today's Date: 03/06/2014   History of Present Illness  Pt is a 63 year old female s/p Left knee arthrotomy with scar excision and polyethylene revision  Clinical Impression  Pt is s/p L knee revision surgery resulting in the deficits listed below (see PT Problem List). Pt will benefit from skilled PT to increase their independence and safety with mobility to allow discharge to the venue listed below.  Pt with very limited ROM pre-op and would benefit from ST-SNF to improve ROM and strengthening of L LE to improve mobility and function.     Follow Up Recommendations SNF    Equipment Recommendations  None recommended by PT    Recommendations for Other Services       Precautions / Restrictions Precautions Precautions: Knee Required Braces or Orthoses: Knee Immobilizer - Left Knee Immobilizer - Left: Discontinue once straight leg raise with < 10 degree lag Restrictions Other Position/Activity Restrictions: WBAT      Mobility  Bed Mobility               General bed mobility comments: pt up in recliner on arrival  Transfers Overall transfer level: Needs assistance Equipment used: Lofstrands Transfers: Sit to/from Stand Sit to Stand: Min guard         General transfer comment: verbal cues for UE and LE positioning  Ambulation/Gait Ambulation/Gait assistance: Min guard Ambulation Distance (Feet): 200 Feet Assistive device: Lofstrands Gait Pattern/deviations: Step-through pattern;Antalgic     General Gait Details: verbal cues for correcting L toe out  Stairs            Wheelchair Mobility    Modified Rankin (Stroke Patients Only)       Balance                                             Pertinent Vitals/Pain Pain Assessment: 0-10 Pain Score: 3  Pain Location: L thigh and knee incision Pain Descriptors / Indicators: Sore Pain  Intervention(s): Limited activity within patient's tolerance;Monitored during session;Premedicated before session;Repositioned;Ice applied    Home Living Family/patient expects to be discharged to:: Skilled nursing facility                 Additional Comments: loftstrand crutches    Prior Function Level of Independence: Independent with assistive device(s)               Hand Dominance        Extremity/Trunk Assessment               Lower Extremity Assessment: LLE deficits/detail   LLE Deficits / Details: very limited pre-op 30-70 degrees per ortho note, continues to remain very limited, unable to perform SLR     Communication   Communication: No difficulties  Cognition Arousal/Alertness: Awake/alert Behavior During Therapy: WFL for tasks assessed/performed Overall Cognitive Status: Within Functional Limits for tasks assessed                      General Comments      Exercises Total Joint Exercises Ankle Circles/Pumps: AROM;Both;15 reps Quad Sets: AROM;Both;15 reps Short Arc QuadSinclair Ship;Left;15 reps Heel Slides: AAROM;Left;15 reps Hip ABduction/ADduction: AROM;Left;15 reps Straight Leg Raises: AAROM;Left;10 reps      Assessment/Plan    PT Assessment Patient needs continued PT services  PT Diagnosis Difficulty walking;Acute pain   PT Problem List Decreased strength;Decreased range of motion;Decreased mobility;Pain  PT Treatment Interventions Functional mobility training;Gait training;DME instruction;Patient/family education;Therapeutic activities;Therapeutic exercise   PT Goals (Current goals can be found in the Care Plan section) Acute Rehab PT Goals PT Goal Formulation: With patient Time For Goal Achievement: 03/10/14 Potential to Achieve Goals: Good    Frequency 7X/week   Barriers to discharge        Co-evaluation               End of Session Equipment Utilized During Treatment: Left knee immobilizer Activity  Tolerance: Patient tolerated treatment well Patient left: in chair;with call bell/phone within reach           Time: 0931-1001 PT Time Calculation (min): 30 min   Charges:   PT Evaluation $Initial PT Evaluation Tier I: 1 Procedure PT Treatments $Gait Training: 8-22 mins $Therapeutic Exercise: 8-22 mins   PT G Codes:          Jearld Hemp,KATHrine E 03/06/2014, 12:08 PM Carmelia Bake, PT, DPT 03/06/2014 Pager: [phone]

## 2014-03-06 NOTE — Progress Notes (Signed)
   Subjective: 1 Day Post-Op Procedure(s) (LRB): LEFT KNEE ARTHROTOMY/SCAR EXCISION/POLYETHYLENE REVISION (Left) Patient reports pain as mild.   Patient seen in rounds with Dr. Wynelle Link.  Patient is well, but has had some minor complaints of pain in the knee, requiring pain medications We will start therapy today.  Plan is to go Skilled nursing facility after hospital stay.  Objective: Vital signs in last 24 hours: Temp:  [97.4 F (36.3 C)-98.3 F (36.8 C)] 97.9 F (36.6 C) (11/03 0649) Pulse Rate:  [56-86] 59 (11/03 0649) Resp:  [12-22] 16 (11/03 0649) BP: (98-140)/(60-85) 134/81 mmHg (11/03 0649) SpO2:  [96 %-100 %] 99 % (11/03 0649) Weight:  [104.781 kg (231 lb)] 104.781 kg (231 lb) (11/02 1211)  Intake/Output from previous day:  Intake/Output Summary (Last 24 hours) at 03/06/14 0853 Last data filed at 03/06/14 0647  Gross per 24 hour  Intake   2400 ml  Output   2685 ml  Net   -285 ml    Labs:  Recent Labs  03/06/14 0840  HGB 11.8*    Recent Labs  03/06/14 0840  WBC 11.5*  RBC 4.28  HCT 36.7  PLT 323   No results for input(s): NA, K, CL, CO2, BUN, CREATININE, GLUCOSE, CALCIUM in the last 72 hours. No results for input(s): LABPT, INR in the last 72 hours.  EXAM General - Patient is Alert and Appropriate Extremity - Neurovascular intact Sensation intact distally Dressing - dressing C/D/I Motor Function - intact, moving foot and toes well on exam.  Hemovac drains are left in place.    Past Medical History  Diagnosis Date  . Hypertension   . Arthritis   . Vertigo     hx post op  . Complication of anesthesia     terrible vertigo  . PONV (postoperative nausea and vomiting)     related to vertigo  . Shortness of breath     WITH EXERTION    Assessment/Plan: 1 Day Post-Op Procedure(s) (LRB): LEFT KNEE ARTHROTOMY/SCAR EXCISION/POLYETHYLENE REVISION (Left) Active Problems:   Arthrofibrosis of knee joint  Estimated body mass index is 33.15 kg/(m^2)  as calculated from the following:   Height as of this encounter: 5\' 10"  (1.778 m).   Weight as of this encounter: 104.781 kg (231 lb). Up with therapy Plan for discharge tomorrow or Thursday Discharge to SNF  DVT Prophylaxis - Lovenox Weight-Bearing as tolerated to left leg D/C O2 and Pulse OX and try on Room Air  Will need CPM at the SNF - 2 hour sessions at least twice a day. She has a JAS brace at home that she will need to use at SNF for 20-30 minutes per session three times a day. Dsg change tomorrow and then to SNF tomorrow or next day.  Arlee Muslim, PA-C Orthopaedic Surgery 03/06/2014, 8:53 AM

## 2014-03-07 ENCOUNTER — Encounter: Payer: Self-pay | Admitting: Internal Medicine

## 2014-03-07 LAB — CBC
HCT: 33.4 % — ABNORMAL LOW (ref 36.0–46.0)
HEMOGLOBIN: 10.6 g/dL — AB (ref 12.0–15.0)
MCH: 27.7 pg (ref 26.0–34.0)
MCHC: 31.7 g/dL (ref 30.0–36.0)
MCV: 87.4 fL (ref 78.0–100.0)
PLATELETS: 299 10*3/uL (ref 150–400)
RBC: 3.82 MIL/uL — ABNORMAL LOW (ref 3.87–5.11)
RDW: 14.1 % (ref 11.5–15.5)
WBC: 9.7 10*3/uL (ref 4.0–10.5)

## 2014-03-07 MED ORDER — DSS 100 MG PO CAPS: 100.0000 mg | ORAL_CAPSULE | Freq: Two times a day (BID) | ORAL | Status: DC

## 2014-03-07 MED ORDER — BISACODYL 10 MG RE SUPP: 10.0000 mg | Freq: Every day | RECTAL | Status: DC | PRN

## 2014-03-07 MED ORDER — HYDROMORPHONE HCL 2 MG PO TABS: 2.0000 mg | ORAL_TABLET | ORAL | Status: DC | PRN

## 2014-03-07 MED ORDER — POLYETHYLENE GLYCOL 3350 17 G PO PACK: 17.0000 g | PACK | Freq: Every day | ORAL | Status: DC | PRN

## 2014-03-07 MED ORDER — METOCLOPRAMIDE HCL 5 MG PO TABS: 5.0000 mg | ORAL_TABLET | Freq: Three times a day (TID) | ORAL | Status: DC | PRN

## 2014-03-07 MED ORDER — ONDANSETRON HCL 4 MG PO TABS: 4.0000 mg | ORAL_TABLET | Freq: Four times a day (QID) | ORAL | Status: DC | PRN

## 2014-03-07 MED ORDER — METHOCARBAMOL 500 MG PO TABS: 500.0000 mg | ORAL_TABLET | Freq: Four times a day (QID) | ORAL | Status: DC | PRN

## 2014-03-07 MED ORDER — TRAMADOL HCL 50 MG PO TABS: 50.0000 mg | ORAL_TABLET | Freq: Four times a day (QID) | ORAL | Status: DC | PRN

## 2014-03-07 NOTE — Progress Notes (Signed)
   Subjective: 2 Days Post-Op Procedure(s) (LRB): LEFT KNEE ARTHROTOMY/SCAR EXCISION/POLYETHYLENE REVISION (Left) Patient reports pain as mild and moderate.   Patient seen in rounds with Dr. Wynelle Link. Patient is well, but has had some minor complaints of pain in the knee, requiring pain medications Plan is to go Skilled nursing facility after hospital stay.  Objective: Vital signs in last 24 hours: Temp:  [97.6 F (36.4 C)-98.1 F (36.7 C)] 98.1 F (36.7 C) (11/04 0511) Pulse Rate:  [50-59] 53 (11/04 0511) Resp:  [16-18] 16 (11/04 0511) BP: (98-120)/(54-77) 98/54 mmHg (11/04 0511) SpO2:  [92 %-98 %] 98 % (11/04 0511)  Intake/Output from previous day:  Intake/Output Summary (Last 24 hours) at 03/07/14 0745 Last data filed at 03/07/14 0515  Gross per 24 hour  Intake 3084.33 ml  Output    975 ml  Net 2109.33 ml    Intake/Output this shift:    Labs:  Recent Labs  03/06/14 0840 03/07/14 0420  HGB 11.8* 10.6*    Recent Labs  03/06/14 0840 03/07/14 0420  WBC 11.5* 9.7  RBC 4.28 3.82*  HCT 36.7 33.4*  PLT 323 299   No results for input(s): NA, K, CL, CO2, BUN, CREATININE, GLUCOSE, CALCIUM in the last 72 hours. No results for input(s): LABPT, INR in the last 72 hours.  EXAM General - Patient is Alert, Appropriate and Oriented Extremity - Neurovascular intact Sensation intact distally Dressing/Incision - clean, dry incision Motor Function - intact, moving foot and toes well on exam.  Hemovacs pulled with difficulty  Past Medical History  Diagnosis Date  . Hypertension   . Arthritis   . Vertigo     hx post op  . Complication of anesthesia     terrible vertigo  . PONV (postoperative nausea and vomiting)     related to vertigo  . Shortness of breath     WITH EXERTION    Assessment/Plan: 2 Days Post-Op Procedure(s) (LRB): LEFT KNEE ARTHROTOMY/SCAR EXCISION/POLYETHYLENE REVISION (Left) Active Problems:   Arthrofibrosis of knee joint  Estimated body  mass index is 33.15 kg/(m^2) as calculated from the following:   Height as of this encounter: 5\' 10"  (1.778 m).   Weight as of this encounter: 104.781 kg (231 lb). Up with therapy Discharge to SNF  Diet - Cardiac F/U - 2 weeks with Dr. Dione Plover DVT Prophylaxis - Aspirin 325 mg daily Weight-Bearing as tolerated to left leg Disposition - SNF if approved COD - improving  Arlee Muslim, PA-C Orthopaedic Surgery 03/07/2014, 7:45 AM

## 2014-03-07 NOTE — Discharge Instructions (Addendum)
Postoperative Constipation Protocol  Constipation - defined medically as fewer than three stools per week and severe constipation as less than one stool per week.  One of the most common issues patients have following surgery is constipation.  Even if you have a regular bowel pattern at home, your normal regimen is likely to be disrupted due to multiple reasons following surgery.  Combination of anesthesia, postoperative narcotics, change in appetite and fluid intake all can affect your bowels.  In order to avoid complications following surgery, here are some recommendations in order to help you during your recovery period.  Colace (docusate) - Pick up an over-the-counter form of Colace or another stool softener and take twice a day as long as you are requiring postoperative pain medications.  Take with a full glass of water daily.  If you experience loose stools or diarrhea, hold the colace until you stool forms back up.  If your symptoms do not get better within 1 week or if they get worse, check with your doctor.  Dulcolax (bisacodyl) - Pick up over-the-counter and take as directed by the product packaging as needed to assist with the movement of your bowels.  Take with a full glass of water.  Use this product as needed if not relieved by Colace only.   MiraLax (polyethylene glycol) - Pick up over-the-counter to have on hand.  MiraLax is a solution that will increase the amount of water in your bowels to assist with bowel movements.  Take as directed and can mix with a glass of water, juice, soda, coffee, or tea.  Take if you go more than two days without a movement. Do not use MiraLax more than once per day. Call your doctor if you are still constipated or irregular after using this medication for 7 days in a row.  If you continue to have problems with postoperative constipation, please contact the office for further assistance and recommendations.  If you experience "the worst abdominal pain ever"  or develop nausea or vomiting, please contact the office immediatly for further recommendations for treatment.    Dr. Gaynelle Arabian Total Joint Specialist [facility] [address], [place], [place] [address] [phone]  TOTAL KNEE REPLACEMENT POSTOPERATIVE DIRECTIONS    Knee Rehabilitation, Guidelines Following Surgery  Results after knee surgery are often greatly improved when you follow the exercise, range of motion and muscle strengthening exercises prescribed by your doctor. Safety measures are also important to protect the knee from further injury. Any time any of these exercises cause you to have increased pain or swelling in your knee joint, decrease the amount until you are comfortable again and slowly increase them. If you have problems or questions, call your caregiver or physical therapist for advice.   HOME CARE INSTRUCTIONS  Remove items at home which could result in a fall. This includes throw rugs or furniture in walking pathways.  Continue medications as instructed at time of discharge. You may have some home medications which will be placed on hold until you complete the course of blood thinner medication.  You may start showering once you are discharged home but do not submerge the incision under water. Just pat the incision dry and apply a dry gauze dressing on daily. Walk with walker as instructed.  You may resume a sexual relationship in one month or when given the OK by  your doctor.   Use walker as long as suggested by your caregivers.  Avoid periods of inactivity such as  sitting longer than an hour when not asleep. This helps prevent blood clots.  You may put full weight on your legs and walk as much as is comfortable.  You may return to work once you are cleared by your doctor.  Do not drive a car for 6 weeks or until released by you surgeon.   Do not drive while taking narcotics.  Wear the elastic stockings for three weeks  following surgery during the day but you may remove then at night. Make sure you keep all of your appointments after your operation with all of your doctors and caregivers. You should call the office at the above phone number and make an appointment for approximately two weeks after the date of your surgery. Change the dressing daily and reapply a dry dressing each time. Please pick up a stool softener and laxative for home use as long as you are requiring pain medications.  Continue to use ice on the knee for pain and swelling from surgery. You may notice swelling that will progress down to the foot and ankle.  This is normal after surgery.  Elevate the leg when you are not up walking on it.   It is important for you to complete the blood thinner medication as prescribed by your doctor.  Continue to use the breathing machine which will help keep your temperature down.  It is common for your temperature to cycle up and down following surgery, especially at night when you are not up moving around and exerting yourself.  The breathing machine keeps your lungs expanded and your temperature down.  RANGE OF MOTION AND STRENGTHENING EXERCISES  Rehabilitation of the knee is important following a knee injury or an operation. After just a few days of immobilization, the muscles of the thigh which control the knee become weakened and shrink (atrophy). Knee exercises are designed to build up the tone and strength of the thigh muscles and to improve knee motion. Often times heat used for twenty to thirty minutes before working out will loosen up your tissues and help with improving the range of motion but do not use heat for the first two weeks following surgery. These exercises can be done on a training (exercise) mat, on the floor, on a table or on a bed. Use what ever works the best and is most comfortable for you Knee exercises include:  Leg Lifts - While your knee is still immobilized in a splint or cast, you  can do straight leg raises. Lift the leg to 60 degrees, hold for 3 sec, and slowly lower the leg. Repeat 10-20 times 2-3 times daily. Perform this exercise against resistance later as your knee gets better.  Quad and Hamstring Sets - Tighten up the muscle on the front of the thigh (Quad) and hold for 5-10 sec. Repeat this 10-20 times hourly. Hamstring sets are done by pushing the foot backward against an object and holding for 5-10 sec. Repeat as with quad sets.  A rehabilitation program following serious knee injuries can speed recovery and prevent re-injury in the future due to weakened muscles. Contact your doctor or a physical therapist for more information on knee rehabilitation.   SKILLED REHAB INSTRUCTIONS: If the patient is transferred to a skilled rehab facility following release from the hospital, a list of the current medications will be sent to the facility for the patient to continue.  When discharged from the skilled rehab facility, please have the facility set up the patient's [facility] prior to being released. Also, the skilled facility will be responsible for providing the patient with their medications at time of release from the facility to include their pain medication, the muscle relaxants, and their blood thinner medication. If the patient is still at the rehab facility at time of the two week follow up appointment, the skilled rehab facility will also need to assist the patient in arranging follow up appointment in our office and any transportation needs.  MAKE SURE YOU:  Understand these instructions.  Will watch your condition.  Will get help right away if you are not doing well or get worse.    Pick up stool softner and laxative for home. Do not submerge incision under water. May shower. Continue to use ice for pain and swelling from surgery.  When discharged from the skilled rehab facility, please have the facility set up the patient's [facility] prior to being released.  Also provide the patient with their medications at time of release from the facility to include their pain medication, the muscle relaxants, and their blood thinner medication.  If the patient is still at the rehab facility at time of follow up appointment, please also assist the patient in arranging follow up appointment in our office and any transportation needs.  Will need CPM at the SNF - 2 hour sessions at least twice a day. She has a JAS brace at home that she will need to use at SNF for 20-30 minutes per session three times a day.

## 2014-03-07 NOTE — Progress Notes (Signed)
Clinical Social Work Department CLINICAL SOCIAL WORK PLACEMENT NOTE 03/07/2014  Patient:  [name] [name],[name] [name]  Account Number:  [id number] [place] date:  03/05/2014  Clinical Social Worker:  Werner Lean, LCSW  Date/time:  03/06/2014 02:58 PM  Clinical Social Work is seeking post-discharge placement for this patient at the following level of care:   SKILLED NURSING   (*CSW will update this form in Epic as items are completed)     Patient/family provided with [facility] Department of Clinical Social Work's list of facilities offering this level of care within the geographic area requested by the patient (or if unable, by the patient's family).  03/06/2014  Patient/family informed of their freedom to choose among providers that offer the needed level of care, that participate in Medicare, Medicaid or managed care program needed by the patient, have an available bed and are willing to accept the patient.    Patient/family informed of [facility]' ownership interest in [facility], as well as of the fact that they are under no obligation to receive care at this facility.  PASARR submitted to EDS on 03/06/2014 PASARR number received on 03/06/2014  FL2 transmitted to all facilities in geographic area requested by pt/family on  03/06/2014 FL2 transmitted to all facilities within larger geographic area on   Patient informed that his/her managed care company has contracts with or will negotiate with  certain facilities, including the following:     Patient/family informed of bed offers received:  03/06/2014 Patient chooses bed at [facility] Physician recommends and patient chooses bed at    Patient to be transferred to [facility] on  03/07/2014 Patient to be transferred to facility by [facility] Patient and family notified of transfer on 03/07/2014 Name of family member notified:  Pt declined CSW assistance.  The following physician request were entered in  Epic:   Additional Comments: Pt is in agreement with d/c to SNF today. Pt is able to transport by car. [employer] provided authorization. NSG reviewed d/c summary, scripts, avs. Scripts included in d/c packet.  Werner Lean LCSW [phone]

## 2014-03-07 NOTE — Progress Notes (Signed)
Physical Therapy Treatment Patient Details Name: [name] [name] MRN: [id number] DOB: Dec 22, 1950 Today's Date: 03-28-14    History of Present Illness Pt is a 63 year old female s/p Left knee arthrotomy with scar excision and polyethylene revision    PT Comments    Pt ambulated in hallway and performed LE exercises.  Continue to recommend SNF upon d/c.  Follow Up Recommendations  SNF     Equipment Recommendations  None recommended by PT    Recommendations for Other Services       Precautions / Restrictions Precautions Precautions: Knee Required Braces or Orthoses: Knee Immobilizer - Left Knee Immobilizer - Left: Discontinue once straight leg raise with < 10 degree lag Restrictions Weight Bearing Restrictions: Yes Other Position/Activity Restrictions: WBAT    Mobility  Bed Mobility               General bed mobility comments: pt up in recliner on arrival  Transfers Overall transfer level: Needs assistance Equipment used: Lofstrands Transfers: Sit to/from Stand Sit to Stand: Min guard         General transfer comment: pt self positions L LE using UEs  Ambulation/Gait Ambulation/Gait assistance: Supervision Ambulation Distance (Feet): 225 Feet Assistive device: Lofstrands Gait Pattern/deviations: Step-through pattern;Antalgic     General Gait Details: decreased ability for total foot contact during stance on L LE likely due to limitations with ROM, slow gait speed today   Stairs            Wheelchair Mobility    Modified Rankin (Stroke Patients Only)       Balance                                    Cognition Arousal/Alertness: Awake/alert Behavior During Therapy: WFL for tasks assessed/performed Overall Cognitive Status: Within Functional Limits for tasks assessed                      Exercises Total Joint Exercises Ankle Circles/Pumps: AROM;Both;15 reps Quad Sets: AROM;Both;15 reps Short Arc QuadSinclair [name];Left;15 reps Heel Slides: AAROM;Left;15 reps Hip ABduction/ADduction: AROM;Left;15 reps Straight Leg Raises: AAROM;Left;10 reps    General Comments        Pertinent Vitals/Pain Pain Assessment: 0-10 Pain Score: 3  Pain Location: L thigh and knee incision Pain Descriptors / Indicators: Sore;Aching Pain Intervention(s): Limited activity within patient's tolerance;Monitored during session;Ice applied    Home Living                      Prior Function            PT Goals (current goals can now be found in the care plan section) Progress towards PT goals: Progressing toward goals    Frequency  7X/week    PT Plan Current plan remains appropriate    Co-evaluation             End of Session Equipment Utilized During Treatment: Left knee immobilizer Activity Tolerance: Patient tolerated treatment well Patient left: in chair;with call bell/phone within reach     Time: 0914-0943 PT Time Calculation (min): 29 min  Charges:  $Gait Training: 8-22 mins $Therapeutic Exercise: 8-22 mins                    G Codes:      [name] [name],[name] [name] 03/28/2014, 11:30 AM Carmelia Bake, PT, DPT 03-28-14 Pager: [phone]

## 2014-03-07 NOTE — Progress Notes (Signed)
   Subjective: 2 Days Post-Op Procedure(s) (LRB): LEFT KNEE ARTHROTOMY/SCAR EXCISION/POLYETHYLENE REVISION (Left) Patient reports pain as mild and moderate.   Patient seen in rounds with Dr. Wynelle Link. Patient is well, but has had some minor complaints of pain in the knee, requiring pain medications Patient is ready to go to SNF  Objective: Vital signs in last 24 hours: Temp:  [97.6 F (36.4 C)-98.1 F (36.7 C)] 98.1 F (36.7 C) (11/04 0511) Pulse Rate:  [50-59] 52 (11/04 1008) Resp:  [16-18] 16 (11/04 0511) BP: (95-120)/(54-61) 95/56 mmHg (11/04 1008) SpO2:  [94 %-98 %] 98 % (11/04 0511)  Intake/Output from previous day:  Intake/Output Summary (Last 24 hours) at 03/07/14 1026 Last data filed at 03/07/14 0900  Gross per 24 hour  Intake 3084.33 ml  Output    975 ml  Net 2109.33 ml    Intake/Output this shift: Total I/O In: 240 [P.O.:240] Out: -   Labs:  Recent Labs  03/06/14 0840 03/07/14 0420  HGB 11.8* 10.6*    Recent Labs  03/06/14 0840 03/07/14 0420  WBC 11.5* 9.7  RBC 4.28 3.82*  HCT 36.7 33.4*  PLT 323 299   No results for input(s): NA, K, CL, CO2, BUN, CREATININE, GLUCOSE, CALCIUM in the last 72 hours. No results for input(s): LABPT, INR in the last 72 hours.  EXAM: General - Patient is Alert, Appropriate and Oriented Extremity - Neurovascular intact Sensation intact distally Incision - clean, dry, no drainage Motor Function - intact, moving foot and toes well on exam.   Assessment/Plan: 2 Days Post-Op Procedure(s) (LRB): LEFT KNEE ARTHROTOMY/SCAR EXCISION/POLYETHYLENE REVISION (Left) Procedure(s) (LRB): LEFT KNEE ARTHROTOMY/SCAR EXCISION/POLYETHYLENE REVISION (Left) Past Medical History  Diagnosis Date  . Hypertension   . Arthritis   . Vertigo     hx post op  . Complication of anesthesia     terrible vertigo  . PONV (postoperative nausea and vomiting)     related to vertigo  . Shortness of breath     WITH EXERTION   Active  Problems:   Arthrofibrosis of knee joint  Estimated body mass index is 33.15 kg/(m^2) as calculated from the following:   Height as of this encounter: 5\' 10"  (1.778 m).   Weight as of this encounter: 104.781 kg (231 lb). Up with therapy Discharge to SNF Diet - Cardiac diet Follow up - in 2 weeks Activity - WBAT Disposition - Skilled nursing facility Condition Upon Discharge - Stable D/C Meds - See DC Summary DVT Prophylaxis - Aspirin 325 mg daily  Will need CPM at the SNF - 2 hour sessions at least twice a day. She has a JAS brace at home that she will need to use at SNF for 20-30 minutes per session three times a day.  Arlee Muslim, PA-C Orthopaedic Surgery 03/07/2014, 10:26 AM

## 2014-03-07 NOTE — Discharge Summary (Signed)
Physician Discharge Summary   Patient ID: [name] [name] MRN: [id number] DOB/AGE: 08/21/1950 63 y.o.  Admit date: 03/05/2014 Discharge date: 03-07-2014  Primary Diagnosis:  Arthrofibrosis, left knee. Admission Diagnoses:  Past Medical History  Diagnosis Date  . Hypertension   . Arthritis   . Vertigo     hx post op  . Complication of anesthesia     terrible vertigo  . PONV (postoperative nausea and vomiting)     related to vertigo  . Shortness of breath     WITH EXERTION   Discharge Diagnoses:   Active Problems:   Arthrofibrosis of knee joint  Estimated body mass index is 33.15 kg/(m^2) as calculated from the following:   Height as of this encounter: '5\' 10"'  (1.778 m).   Weight as of this encounter: 104.781 kg (231 lb).  Procedure:  Procedure(s) (LRB): LEFT KNEE ARTHROTOMY/SCAR EXCISION/POLYETHYLENE REVISION (Left)   Consults: None  HPI: [name] [name] is a 63 year old female, complex history in regards to her left knee. I saw her with a painful stiff knee with periprosthetic loosening. She had a revision and has had significant scarring. She had a closed manipulation with minimal improvement. She has range of motion about 30-70 degrees. She presents now for arthrotomy, scar excision, and possible revision. Laboratory Data: Admission on 03/05/2014  Component Date Value Ref Range Status  . WBC 03/06/2014 11.5* 4.0 - 10.5 K/uL Final  . RBC 03/06/2014 4.28  3.87 - 5.11 MIL/uL Final  . Hemoglobin 03/06/2014 11.8* 12.0 - 15.0 g/dL Final  . HCT 03/06/2014 36.7  36.0 - 46.0 % Final  . MCV 03/06/2014 85.7  78.0 - 100.0 fL Final  . MCH 03/06/2014 27.6  26.0 - 34.0 pg Final  . MCHC 03/06/2014 32.2  30.0 - 36.0 g/dL Final  . RDW 03/06/2014 13.8  11.5 - 15.5 % Final  . Platelets 03/06/2014 323  150 - 400 K/uL Final  . WBC 03/07/2014 9.7  4.0 - 10.5 K/uL Final  . RBC 03/07/2014 3.82* 3.87 - 5.11 MIL/uL Final  . Hemoglobin 03/07/2014 10.6* 12.0 - 15.0 g/dL Final  . HCT  03/07/2014 33.4* 36.0 - 46.0 % Final  . MCV 03/07/2014 87.4  78.0 - 100.0 fL Final  . MCH 03/07/2014 27.7  26.0 - 34.0 pg Final  . MCHC 03/07/2014 31.7  30.0 - 36.0 g/dL Final  . RDW 03/07/2014 14.1  11.5 - 15.5 % Final  . Platelets 03/07/2014 299  150 - 400 K/uL Final  Hospital Outpatient Visit on 02/26/2014  Component Date Value Ref Range Status  . MRSA, PCR 02/26/2014 NEGATIVE  NEGATIVE Final  . Staphylococcus aureus 02/26/2014 NEGATIVE  NEGATIVE Final   Comment:                                 The Xpert SA Assay (FDA                          approved for NASAL specimens                          in patients over 36 years of age),                          is one component of  a comprehensive surveillance                          program.  Test performance has                          been validated by [facility] for patients greater                          than or equal to 63 year old.                          It is not intended                          to diagnose infection nor to                          guide or monitor treatment.  Marland Kitchen aPTT 02/26/2014 29  24 - 37 seconds Final  . WBC 02/26/2014 6.9  4.0 - 10.5 K/uL Final  . RBC 02/26/2014 4.78  3.87 - 5.11 MIL/uL Final  . Hemoglobin 02/26/2014 13.1  12.0 - 15.0 g/dL Final  . HCT 02/26/2014 41.6  36.0 - 46.0 % Final  . MCV 02/26/2014 87.0  78.0 - 100.0 fL Final  . MCH 02/26/2014 27.4  26.0 - 34.0 pg Final  . MCHC 02/26/2014 31.5  30.0 - 36.0 g/dL Final  . RDW 02/26/2014 13.7  11.5 - 15.5 % Final  . Platelets 02/26/2014 339  150 - 400 K/uL Final  . Sodium 02/26/2014 141  137 - 147 mEq/L Final  . Potassium 02/26/2014 4.0  3.7 - 5.3 mEq/L Final  . Chloride 02/26/2014 101  96 - 112 mEq/L Final  . CO2 02/26/2014 29  19 - 32 mEq/L Final  . Glucose, Bld 02/26/2014 88  70 - 99 mg/dL Final  . BUN 02/26/2014 12  6 - 23 mg/dL Final  . Creatinine, Ser 02/26/2014 0.71  0.50 - 1.10 mg/dL  Final  . Calcium 02/26/2014 9.3  8.4 - 10.5 mg/dL Final  . Total Protein 02/26/2014 7.4  6.0 - 8.3 g/dL Final  . Albumin 02/26/2014 3.4* 3.5 - 5.2 g/dL Final  . AST 02/26/2014 23  0 - 37 U/L Final   Comment: SLIGHT HEMOLYSIS                          HEMOLYSIS AT THIS LEVEL MAY AFFECT RESULT  . ALT 02/26/2014 17  0 - 35 U/L Final  . Alkaline Phosphatase 02/26/2014 124* 39 - 117 U/L Final  . Total Bilirubin 02/26/2014 0.3  0.3 - 1.2 mg/dL Final  . GFR calc non Af Amer 02/26/2014 89* >90 mL/min Final  . GFR calc Af Amer 02/26/2014 >90  >90 mL/min Final   Comment: (NOTE)                          The eGFR has been calculated using the CKD EPI equation.  This calculation has not been validated in all clinical situations.                          eGFR's persistently <90 mL/min signify possible Chronic Kidney                          Disease.  . Anion gap 02/26/2014 11  5 - 15 Final  . Prothrombin Time 02/26/2014 13.7  11.6 - 15.2 seconds Final  . INR 02/26/2014 1.04  0.00 - 1.49 Final  . ABO/RH(D) 02/26/2014 O POS   Final  . Antibody Screen 02/26/2014 NEG   Final  . Sample Expiration 02/26/2014 03/08/2014   Final  . Color, Urine 02/26/2014 YELLOW  YELLOW Final  . APPearance 02/26/2014 CLEAR  CLEAR Final  . Specific Gravity, Urine 02/26/2014 1.016  1.005 - 1.030 Final  . pH 02/26/2014 6.5  5.0 - 8.0 Final  . Glucose, UA 02/26/2014 NEGATIVE  NEGATIVE mg/dL Final  . Hgb urine dipstick 02/26/2014 NEGATIVE  NEGATIVE Final  . Bilirubin Urine 02/26/2014 NEGATIVE  NEGATIVE Final  . Ketones, ur 02/26/2014 NEGATIVE  NEGATIVE mg/dL Final  . Protein, ur 02/26/2014 NEGATIVE  NEGATIVE mg/dL Final  . Urobilinogen, UA 02/26/2014 1.0  0.0 - 1.0 mg/dL Final  . Nitrite 02/26/2014 NEGATIVE  NEGATIVE Final  . Leukocytes, UA 02/26/2014 TRACE* NEGATIVE Final  . Squamous Epithelial / LPF 02/26/2014 RARE  RARE Final  . WBC, UA 02/26/2014 0-2  <3 WBC/hpf Final  . Bacteria, UA 02/26/2014  FEW* RARE Final  . Urine-Other 02/26/2014 MUCOUS PRESENT   Final     X-Rays:Dg Chest 2 View  02/26/2014   CLINICAL DATA:  Preop knee surgery, hypertension. Shortness of breath with exertion.  EXAM: CHEST  2 VIEW  COMPARISON:  12/01/2012.  FINDINGS: Trachea is midline. Heart size stable. Lungs are clear. No pleural fluid. Degenerative changes are seen in the spine.  IMPRESSION: No acute findings.   Electronically Signed   By: Lorin Picket M.D.   On: 02/26/2014 11:31    EKG:No orders found for this or any previous visit.   Hospital Course: KAY SHIPPY is a 63 y.o. who was admitted to [facility]. They were brought to the operating room on 03/05/2014 and underwent Procedure(s): LEFT KNEE ARTHROTOMY/SCAR EXCISION/POLYETHYLENE REVISION.  Patient tolerated the procedure well and was later transferred to the recovery room and then to the orthopaedic floor for postoperative care.  They were given PO and IV analgesics for pain control following their surgery.  They were given 24 hours of postoperative antibiotics of  Anti-infectives    Start     Dose/Rate Route Frequency Ordered Stop   03/05/14 2130  ceFAZolin (ANCEF) IVPB 2 g/50 mL premix     2 g100 mL/hr over 30 Minutes Intravenous Every 6 hours 03/05/14 1826 03/06/14 0906   03/05/14 1155  ceFAZolin (ANCEF) IVPB 2 g/50 mL premix     2 g100 mL/hr over 30 Minutes Intravenous On call to O.R. 03/05/14 1155 03/05/14 1510     and started on DVT prophylaxis in the form of Lovenox.   PT and OT were ordered for total joint protocol.  Social worker got involved to assist with placement.  Patient had a tough night due to pain.  They started to get up OOB with therapy on day one. Hemovac drains were pulled without difficulty on POD 2.  Continued to work with therapy  into day two.  Dressing was changed on day two and the incision was healing well.  Patient was seen in rounds and was ready to go to SNF.  Discharge to SNF Diet - Cardiac  diet Follow up - in 2 weeks Activity - WBAT Disposition - Skilled nursing facility Condition Upon Discharge - Stable D/C Meds - See DC Summary DVT Prophylaxis - Aspirin 325 mg daily  Will need CPM at the SNF - 2 hour sessions at least twice a day. She has a JAS brace at home that she will need to use at SNF for 20-30 minutes per session three times a day.       Discharge Instructions    CPM    Complete by:  As directed   Continuous passive motion machine (CPM):      Use the CPM from 10 degrees to 60 degrees for 4 hours per day.      You may increase by 5-10 degrees per day.  You need break it up into 2 or 3 sessions per day.      Use CPM for 3 weeks or until you are told to stop.  Use JAS splint to left knee for 20-30 minutes per session for three sessions per day.     Call MD / Call 911    Complete by:  As directed   If you experience chest pain or shortness of breath, CALL 911 and be transported to the hospital emergency room.  If you develope a fever above 101 F, pus (white drainage) or increased drainage or redness at the wound, or calf pain, call your surgeon's office.     Change dressing    Complete by:  As directed   Change dressing daily with sterile 4 x 4 inch gauze dressing and apply TED hose. Do not submerge the incision under water.     Constipation Prevention    Complete by:  As directed   Drink plenty of fluids.  Prune juice may be helpful.  You may use a stool softener, such as Colace (over the counter) 100 mg twice a day.  Use MiraLax (over the counter) for constipation as needed.     Diet - low sodium heart healthy    Complete by:  As directed      Discharge instructions    Complete by:  As directed   Pick up stool softner and laxative for home. Do not submerge incision under water. May shower. Continue to use ice for pain and swelling from surgery. Aspirin 325 ng daily for four weeks  CPM at SNF for two hour sessions twice a day JAS splint for 20-30 minutes  per session three times a day.  When discharged from the skilled rehab facility, please have the facility set up the patient's [place] prior to being released.  Also provide the patient with their medications at time of release from the facility to include their pain medication, the muscle relaxants, and their blood thinner medication.  If the patient is still at the rehab facility at time of follow up appointment, please also assist the patient in arranging follow up appointment in our office and any transportation needs.     Do not put a pillow under the knee. Place it under the heel.    Complete by:  As directed      Do not sit on low chairs, stoools or toilet seats, as it may be difficult to get up from low surfaces  Complete by:  As directed      Driving restrictions    Complete by:  As directed   No driving until released by the physician.     Increase activity slowly as tolerated    Complete by:  As directed      Lifting restrictions    Complete by:  As directed   No lifting until released by the physician.     Patient may shower    Complete by:  As directed   You may shower without a dressing once there is no drainage.  Do not wash over the wound.  If drainage remains, do not shower until drainage stops.     TED hose    Complete by:  As directed   Use stockings (TED hose) for 3 weeks on both leg(s).  You may remove them at night for sleeping.     Weight bearing as tolerated    Complete by:  As directed             Medication List    TAKE these medications        acetaminophen 500 MG tablet  Commonly known as:  TYLENOL  Take 500 mg by mouth every 6 (six) hours as needed for pain.     bisacodyl 10 MG suppository  Commonly known as:  DULCOLAX  Place 1 suppository (10 mg total) rectally daily as needed for moderate constipation.     DSS 100 MG Caps  Take 100 mg by mouth 2 (two) times daily.     hydrochlorothiazide 25 MG tablet  Commonly known as:   HYDRODIURIL  Take 25 mg by mouth every morning.     HYDROmorphone 2 MG tablet  Commonly known as:  DILAUDID  Take 1-2 tablets (2-4 mg total) by mouth every 4 (four) hours as needed for moderate pain or severe pain.     meclizine 25 MG tablet  Commonly known as:  ANTIVERT  Take 25 mg by mouth 3 (three) times daily as needed for dizziness.     methocarbamol 500 MG tablet  Commonly known as:  ROBAXIN  Take 1 tablet (500 mg total) by mouth every 6 (six) hours as needed for muscle spasms.     metoCLOPramide 5 MG tablet  Commonly known as:  REGLAN  Take 1-2 tablets (5-10 mg total) by mouth every 8 (eight) hours as needed for nausea (if ondansetron (ZOFRAN) ineffective.).     ondansetron 4 MG tablet  Commonly known as:  ZOFRAN  Take 1 tablet (4 mg total) by mouth every 6 (six) hours as needed for nausea.     polyethylene glycol packet  Commonly known as:  MIRALAX / GLYCOLAX  Take 17 g by mouth daily as needed for mild constipation.     traMADol 50 MG tablet  Commonly known as:  ULTRAM  Take 1-2 tablets (50-100 mg total) by mouth every 6 (six) hours as needed (mild pain).     VENTOLIN HFA 108 (90 BASE) MCG/ACT inhaler  Generic drug:  albuterol  Inhale 2 puffs into the lungs every 6 (six) hours as needed for wheezing or shortness of breath.       Follow-up Information    Follow up with Gearlean Alf, MD. Schedule an appointment as soon as possible for a visit on 03/20/2014.   Specialty:  Orthopedic Surgery   Why:  Call office at [phone] to set up appointment for patient on Tuesday 03/20/2014.   Contact information:   [address]  [place] [address] [phone]       Signed: Arlee Muslim, PA-C Orthopaedic Surgery 03/07/2014, 10:36 AM

## 2014-03-28 ENCOUNTER — Emergency Department: Payer: Self-pay | Admitting: Student

## 2014-03-28 LAB — COMPREHENSIVE METABOLIC PANEL
ALT: 22 U/L
AST: 22 U/L (ref 15–37)
Albumin: 3.2 g/dL — ABNORMAL LOW (ref 3.4–5.0)
Alkaline Phosphatase: 235 U/L — ABNORMAL HIGH
Anion Gap: 9 (ref 7–16)
BUN: 12 mg/dL (ref 7–18)
Bilirubin,Total: 0.6 mg/dL (ref 0.2–1.0)
CALCIUM: 8.7 mg/dL (ref 8.5–10.1)
Chloride: 104 mmol/L (ref 98–107)
Co2: 29 mmol/L (ref 21–32)
Creatinine: 0.79 mg/dL (ref 0.60–1.30)
EGFR (African American): >60
Glucose: 115 mg/dL — ABNORMAL HIGH (ref 65–99)
OSMOLALITY: 284 (ref 275–301)
Potassium: 3.8 mmol/L (ref 3.5–5.1)
Sodium: 142 mmol/L (ref 136–145)
Total Protein: 7.9 g/dL (ref 6.4–8.2)

## 2014-03-28 LAB — URINALYSIS, COMPLETE
BACTERIA: NONE SEEN
Bilirubin,UR: NEGATIVE
Blood: NEGATIVE
GLUCOSE, UR: NEGATIVE mg/dL (ref 0–75)
Ketone: NEGATIVE
LEUKOCYTE ESTERASE: NEGATIVE
Nitrite: NEGATIVE
Ph: 6 (ref 4.5–8.0)
Protein: NEGATIVE
RBC, UR: NONE SEEN /HPF (ref 0–5)
SPECIFIC GRAVITY: 1.023 (ref 1.003–1.030)
Squamous Epithelial: 1
WBC UR: 3 /HPF (ref 0–5)

## 2014-03-28 LAB — CBC
HCT: 41.5 % (ref 35.0–47.0)
HGB: 13.4 g/dL (ref 12.0–16.0)
MCH: 27.7 pg (ref 26.0–34.0)
MCHC: 32.3 g/dL (ref 32.0–36.0)
MCV: 86 fL (ref 80–100)
Platelet: 456 10*3/uL — ABNORMAL HIGH (ref 150–440)
RBC: 4.84 10*6/uL (ref 3.80–5.20)
RDW: 14.2 % (ref 11.5–14.5)
WBC: 9.3 10*3/uL (ref 3.6–11.0)

## 2014-03-28 LAB — PROTIME-INR
INR: 1.1
Prothrombin Time: 14.2 secs (ref 11.5–14.7)

## 2014-03-28 LAB — APTT: Activated PTT: 30.2 secs (ref 23.6–35.9)

## 2014-03-28 LAB — LIPASE, BLOOD: Lipase: 52 U/L — ABNORMAL LOW (ref 73–393)

## 2014-03-28 LAB — TROPONIN I: Troponin-I: <0.02 ng/mL

## 2014-04-03 ENCOUNTER — Encounter: Payer: Self-pay | Admitting: Internal Medicine

## 2014-04-03 ENCOUNTER — Encounter: Payer: Self-pay | Admitting: Orthopedic Surgery

## 2014-05-04 ENCOUNTER — Encounter: Payer: Self-pay | Admitting: Orthopedic Surgery

## 2014-05-04 ENCOUNTER — Encounter: Payer: Self-pay | Admitting: Internal Medicine

## 2014-06-04 ENCOUNTER — Encounter: Payer: Self-pay | Admitting: Orthopedic Surgery

## 2014-08-17 ENCOUNTER — Other Ambulatory Visit: Payer: Self-pay | Admitting: Family Medicine

## 2014-08-17 DIAGNOSIS — N63 Unspecified lump in unspecified breast: Secondary | ICD-10-CM

## 2014-08-24 NOTE — H&P (Signed)
PATIENT NAME:  [name] [name], [name] [name] MR#:  [id number] DATE OF BIRTH:  [birth year]  DATE OF ADMISSION:  07/02/2012  REFERRING PHYSICIAN:  Dr. Lovena Le in the Emergency Room.   FAMILY PHYSICIAN:  None.   REASON FOR ADMISSION:  Intractable vomiting with vertigo.   HISTORY OF PRESENT ILLNESS:  The patient is a 64 year old female with a history of hypertension and osteoarthritis who presents with acute onset of intractable nausea, vomiting, dizziness, and vertigo. In the Emergency Room, the patient was given IV Valium with IV Zofran and p.o. meclizine with persistent symptoms. She is so dizzy that she is unable to walk. She is unable to keep liquids or solids down despite IV Zofran. She is now admitted for further evaluation.   PAST MEDICAL HISTORY:  1.  Benign hypertension.  2.  Osteoarthritis.  3.  Fibrocystic breast disease.  4.  Status post bilateral knee surgery.  5.  Status post right carpal tunnel surgery.   MEDICATIONS:  1.  Gabapentin 600 mg p.o. at bedtime.  2.  Hydrochlorothiazide 25 mg p.o. daily.  3.  Norco 5/325, 1 to 2 p.o. every 6 hours p.r.n.   ALLERGIES:  No known drug allergies.   SOCIAL HISTORY:  The patient denies alcohol or tobacco abuse.   FAMILY HISTORY:  Positive for diabetes and stroke.   REVIEW OF SYSTEMS:  CONSTITUTIONAL:  No fever or change in weight.  EYES:  Some blurred vision. No glaucoma.  ENT:  No tinnitus or hearing loss. No nasal discharge or bleeding. No difficulty swallowing.  RESPIRATORY:  No cough or wheezing. Denies hemoptysis.  CARDIOVASCULAR:  No chest pain or orthopnea. No palpitations or syncope.  GASTROINTESTINAL:  No diarrhea. No abdominal pain.  GENITOURINARY:  No dysuria or hematuria. No incontinence.  ENDOCRINE:  No polyuria or polydipsia. No heat or cold intolerance.  HEMATOLOGIC:  The patient denies anemia, easy bruising, or bleeding.  LYMPHATIC:  No swollen glands.  MUSCULOSKELETAL:  The patient denies pain in her neck, back,  shoulders, knees, or hips. No gout.  NEUROLOGIC:  No numbness. Denies migraines, stroke or seizures.  PSYCH:  The patient denies anxiety, insomnia, or depression.   PHYSICAL EXAMINATION:  GENERAL:  The patient is somewhat lethargic but in no acute distress.  VITAL SIGNS:  Remarkable for a blood pressure of 116/68 with a heart rate of 81 and a respiratory rate of 18. She is afebrile.  HEENT:  Normocephalic, atraumatic. Pupils equally round and reactive to light and accommodation, extraocular movements are intact. Sclerae is anicteric. Conjunctivae are clear.  OROPHARYNX:  Clear.  NECK:  Supple without JVD or bruits. No adenopathy or thyromegaly is noted.  LUNGS:  Clear to auscultation and percussion without wheezes, rales, or rhonchi. No dullness.  CARDIAC:  Regular rate and rhythm with normal S1, S2. No significant rubs, murmurs, or gallops. PMI is nondisplaced. Chest wall is nontender.  ABDOMEN:  Soft, nontender, with normoactive bowel sounds. No organomegaly or masses were appreciated. No hernias or bruits were noted.  EXTREMITIES:  Without clubbing, cyanosis, or edema. Pulses were 2+ bilaterally.  SKIN:  Warm and dry without rash or lesions.  NEUROLOGIC:  Cranial nerves II through XII  grossly intact. Deep tendon reflexes were symmetric. Motor and sensory examination is nonfocal.  PSYCH:  Reveals patient is alert and oriented to person, place, and time. She was cooperative and used good judgment.   LABORATORY DATA:  EKG revealed sinus rhythm with no acute ischemic changes. Head CT was unremarkable. CBC was within  normal limits. Troponin was less than 0.02. Glucose was 102 with a BUN of 12 and a creatinine of 0.74 with a sodium of 139 and a potassium of 3.6. Urinalysis was negative.   ASSESSMENT:  1.  Intractable nausea and vomiting.  2.  Vertigo with dizziness.  3.  Generalized weakness.  4.  Hyperglycemia.  5.  Benign hypertension, stable.   PLAN:  The patient will be observed with IV  fluids and p.o. meclizine t.i.d. We will use IV Zofran and IV Valium as needed to control her symptoms. We will attempt a clear liquid diet at this time. Neuro checks q.4 hours. We will obtain an MRI of the brain later this morning. Followup labs tomorrow including a sedimentation rate. Further treatment and evaluation will depend upon the patient's progress.  TOTAL TIME SPENT:  45 minutes.   ____________________________ Leonie Douglas Doy Hutching, MD jds:jm D: 07/02/2012 03:36:28 ET T: 07/02/2012 10:58:20 ET JOB#: [id number]  cc: Leonie Douglas. Doy Hutching, MD, <Dictator> Azari Hasler Lennice Sites MD ELECTRONICALLY SIGNED 07/04/2012 8:00

## 2014-08-24 NOTE — Discharge Summary (Signed)
PATIENT NAME:  [name] [name], WOODING MR#:  [id number] DATE OF BIRTH:  1950/12/23  DATE OF ADMISSION:  07/02/2012 DATE OF DISCHARGE:  07/04/2012  HISTORY AND PHYSICAL:  For a detailed note, please take a look at the history and physical done on admission by Dr. Doy Hutching.   DISCHARGE DIAGNOSES: 1.  Dizziness and vertigo.  2.  History of left knee replacement.  3.  History of osteoarthritis.  4.  Hypertension.  5.  Hypokalemia.   DIET: The patient is being discharged on a low-sodium diet.   ACTIVITY: As tolerated.   FOLLOWUP: With Dr. Netty Starring in the next 1 to 2 weeks.   DISCHARGE MEDICATIONS: Hydrochlorothiazide 25 mg daily, gabapentin 300 mg 2 caps at bedtime, Norco 5/325, 2 tabs q. 6 hours as needed, meclizine 25 mg t.i.d. as needed.   [facility] RADIOLOGICAL DATA:    A CT scan of the head done without contrast on admission showed no acute intracranial process. An MRI of the brain was also done and showed no evidence of an acute intracranial process. Normal MRI of the brain with and without IV contrast.   BRIEF HOSPITAL COURSE: This is a 64 year old female who presented to the hospital with vertigo and dizziness.   1. Vertigo and dizziness: The patient was observed overnight in the hospital, was started on some meclizine and some IV Valium. She continued to have persistent dizziness and nausea which was treated supportively with IV fluids, antiemetics and meclizine as stated. She had a CT of the head and MRI of the brain which were essentially negative. Over the past 48 hours, the patient's dizziness and symptoms have significantly improved. She is being discharged home with Home Health services, given her recent left knee replacement and her still requiring some physical therapy. She will continue some meclizine as needed for her vertigo.  2. Status post left knee replacement: The patient had a left knee replacement in October 2013. She has had some difficulty with that knee since  then. She has even had the knee revised since then. She is seeking a second opinion regarding her knee as it keeps getting locked, and she is having difficulty ambulating with it. I did have a physical therapy eval done in the hospital, and they recommended getting Home Health physical therapy, which is being arranged for her. She is seeking a second opinion and has been referred by one of her physical therapists as an outpatient to a new orthopedic surgeon.  3. Hypertension: The patient remained hemodynamically stable. She will continue hydrochlorothiazide.  4. Neuropathy: She will continue her gabapentin as she was maintained on that.  5. Hypokalemia: Her potassium was supplemented.   CODE STATUS: The patient is a FULL CODE.   TIME SPENT: 40 minutes.   ____________________________ Belia Heman. Verdell Carmine, MD vjs:cb D: 07/04/2012 14:52:06 ET T: 07/04/2012 16:34:34 ET JOB#: [id number]  cc: Belia Heman. Verdell Carmine, MD, <Dictator> Dion Body, MD Henreitta Leber MD ELECTRONICALLY SIGNED 07/13/2012 8:12

## 2014-08-24 NOTE — H&P (Signed)
PATIENT NAME:  [name] [name], [name] [name] MR#:  [id number] DATE OF BIRTH:  1950/07/13  DATE OF ADMISSION:  07/02/2012  REFERRING PHYSICIAN:  Dr. Lovena Le in the Emergency Room.   FAMILY PHYSICIAN:  None.   REASON FOR ADMISSION:  Intractable vomiting with vertigo.   HISTORY OF PRESENT ILLNESS:  The patient is a 64 year old female with a history of hypertension and osteoarthritis who presents with acute onset of intractable nausea, vomiting, dizziness, and vertigo. In the Emergency Room, the patient was given IV Valium with IV Zofran and p.o. meclizine with persistent symptoms. She is so dizzy that she is unable to walk. She is unable to keep liquids or solids down despite IV Zofran. She is now admitted for further evaluation.   PAST MEDICAL HISTORY:  1.  Benign hypertension.  2.  Osteoarthritis.  3.  Fibrocystic breast disease.  4.  Status post bilateral knee surgery.  5.  Status post right carpal tunnel surgery.   MEDICATIONS:  1.  Gabapentin 600 mg p.o. at bedtime.  2.  Hydrochlorothiazide 25 mg p.o. daily.  3.  Norco 5/325, 1 to 2 p.o. every 6 hours p.r.n.   ALLERGIES:  No known drug allergies.   SOCIAL HISTORY:  The patient denies alcohol or tobacco abuse.   FAMILY HISTORY:  Positive for diabetes and stroke.   REVIEW OF SYSTEMS:  CONSTITUTIONAL:  No fever or change in weight.  EYES:  Some blurred vision. No glaucoma.  ENT:  No tinnitus or hearing loss. No nasal discharge or bleeding. No difficulty swallowing.  RESPIRATORY:  No cough or wheezing. Denies hemoptysis.  CARDIOVASCULAR:  No chest pain or orthopnea. No palpitations or syncope.  GASTROINTESTINAL:  No diarrhea. No abdominal pain.  GENITOURINARY:  No dysuria or hematuria. No incontinence.  ENDOCRINE:  No polyuria or polydipsia. No heat or cold intolerance.  HEMATOLOGIC:  The patient denies anemia, easy bruising, or bleeding.  LYMPHATIC:  No swollen glands.  MUSCULOSKELETAL:  The patient denies pain in her neck, back,  shoulders, knees, or hips. No gout.  NEUROLOGIC:  No numbness. Denies migraines, stroke or seizures.  PSYCH:  The patient denies anxiety, insomnia, or depression.   PHYSICAL EXAMINATION:  GENERAL:  The patient is somewhat lethargic but in no acute distress.  VITAL SIGNS:  Remarkable for a blood pressure of 116/68 with a heart rate of 81 and a respiratory rate of 18. She is afebrile.  HEENT:  Normocephalic, atraumatic. Pupils equally round and reactive to light and accommodation, extraocular movements are intact. Sclerae is anicteric. Conjunctivae are clear.  OROPHARYNX:  Clear.  NECK:  Supple without JVD or bruits. No adenopathy or thyromegaly is noted.  LUNGS:  Clear to auscultation and percussion without wheezes, rales, or rhonchi. No dullness.  CARDIAC:  Regular rate and rhythm with normal S1, S2. No significant rubs, murmurs, or gallops. PMI is nondisplaced. Chest wall is nontender.  ABDOMEN:  Soft, nontender, with normoactive bowel sounds. No organomegaly or masses were appreciated. No hernias or bruits were noted.  EXTREMITIES:  Without clubbing, cyanosis, or edema. Pulses were 2+ bilaterally.  SKIN:  Warm and dry without rash or lesions.  NEUROLOGIC:  Cranial nerves II through XII  grossly intact. Deep tendon reflexes were symmetric. Motor and sensory examination is nonfocal.  PSYCH:  Reveals patient is alert and oriented to person, place, and time. She was cooperative and used good judgment.   LABORATORY DATA:  EKG revealed sinus rhythm with no acute ischemic changes. Head CT was unremarkable. CBC was within  normal limits. Troponin was less than 0.02. Glucose was 102 with a BUN of 12 and a creatinine of 0.74 with a sodium of 139 and a potassium of 3.6. Urinalysis was negative.   ASSESSMENT:  1.  Intractable nausea and vomiting.  2.  Vertigo with dizziness.  3.  Generalized weakness.  4.  Hyperglycemia.  5.  Benign hypertension, stable.   PLAN:  The patient will be observed with IV  fluids and p.o. meclizine t.i.d. We will use IV Zofran and IV Valium as needed to control her symptoms. We will attempt a clear liquid diet at this time. Neuro checks q.4 hours. We will obtain an MRI of the brain later this morning. Followup labs tomorrow including a sedimentation rate. Further treatment and evaluation will depend upon the patient's progress.  TOTAL TIME SPENT:  45 minutes.   ____________________________ Leonie Douglas Doy Hutching, MD jds:jm D: 07/02/2012 03:36:28 ET T: 07/02/2012 10:58:20 ET JOB#: [id number]  cc: Leonie Douglas. Doy Hutching, MD, <Dictator> JEFFREY Lennice Sites MD ELECTRONICALLY SIGNED 07/04/2012 8:00

## 2014-08-24 NOTE — H&P (Signed)
PATIENT NAME:  [name] [name], [name] [name] MR#:  [id number] DATE OF BIRTH:  07/06/1950  DATE OF ADMISSION:  12/27/2012  PRIMARY CARE PROVIDER: Dr. Netty Starring ED REFERRING PHYSICIAN: Dr. Jasmine December   CHIEF COMPLAINT: Shortness of breath.   HISTORY OF PRESENT ILLNESS: The patient is a 64 year old African American female with previous history of having hypertension, chronic B12 levels being low, vitamin D deficiency who reports that she had left knee replaced on August 6th, and since then she has had some issues with dizziness and some shortness of breath which has progressively gotten worse. The patient reports that she is also using 2 pillows to help prop herself up.  She also has some vague chest pressure. The patient also complains of lack of energy and feeling very tired. She also feels like her heart is palpating. She denies any abdominal pain, nausea, vomiting or diarrhea, denies any urinary frequency, urgency or hesitancy.   PAST MEDICAL HISTORY: Significant for: 1.  Osteoarthritis.  2.  Morbid obesity.  3.  Hypertension.  4.  History of B12 deficiency.  5.  History of vitamin D deficiency.   PAST SURGICAL HISTORY:  1.  Status post left knee replacement x 2.  2.  History of right carpal tunnel surgery.  3.  Status post right breast biopsy.  4.  Status post cervical surgery.  5.  Status post right hand surgery.   ALLERGIES: None.   CURRENT MEDICATIONS: Gabapentin 600 at bedtime, hydrochlorothiazide 25 p.o. daily, meclizine 25, 3 times a day as needed for dizziness, Xarelto 10 mg daily.   SOCIAL HISTORY: Does not smoke. Does not drink. No drugs.   FAMILY HISTORY: Mother died of heart problems.  REVIEW OF SYSTEMS:   CONSTITUTIONAL: Denies any fevers. Complains of fatigue, weakness. No weight loss, weight gain.  EYES: No blurred or double vision. No pain. No redness. No inflammation. No glaucoma. No cataracts.  EARS, NOSE AND THROAT: No tinnitus. No ear pain. No hearing loss. No seasonal or  year-round allergies. No difficulty swallowing.  RESPIRATORY: Denies any cough, wheezing. No hemoptysis. Complains of dyspnea. No painful respirations.  CARDIOVASCULAR: Denies any chest pain, orthopnea. Complains of edema. No arrhythmia. Has dyspnea on exertion. No syncope.  GASTROINTESTINAL: No nausea, vomiting, diarrhea. No abdominal pain. No hematemesis. No melena. No ulcer. No GERD. No IBS. No jaundice.  GENITOURINARY: Denies any dysuria, hematuria, renal calculus or frequency.  ENDOCRINE: Denies any polyuria, nocturia or thyroid problems.  HEMATOLOGIC/LYMPHATIC: Denies anemia, easy bruisability or bleeding.  SKIN: Acne. No rash. No changes in mole, hair or skin.  MUSCULOSKELETAL: Complains of pain in the knee that was operated on. NEUROLOGICAL: No numbness. No CVA. No TIA.  PSYCHIATRIC: No anxiety. No insomnia. No ADD.   PHYSICAL EXAMINATION: VITAL SIGNS: Temperature 97, pulse 82, respirations 18, blood pressure 132/78, O2 96% on room air.  GENERAL: The patient is an obese African American female in no acute distress.  HEAD, EYES, EARS, NOSE, THROAT: Head atraumatic, normocephalic. Pupils equally round, reactive to light and accommodation. There is no conjunctival pallor. No scleral icterus. Nasal exam shows no drainage or ulceration. Oropharynx is clear without any exudate.  NECK: Supple without any JVD.  CARDIOVASCULAR: Regular rate and rhythm. No murmurs, rubs, clicks or gallops. PMI is not displaced.  LUNGS: She has got bilateral crackles at the bases. There are no rales, rhonchi or wheezing.  ABDOMEN: Soft, nontender, nondistended. Positive bowel sounds x 4. No hepatosplenomegaly.  SKIN: No rash.  LYMPHATICS: No lymph nodes palpable.  VASCULAR:  Good DP, PT pulses.  PSYCHIATRIC: Not anxious or depressed.  NEUROLOGICAL: Awake, alert, oriented x 3. No focal deficits.   LABORATORY AND RADIOLOGICAL EVALUATIONS:  CT scan of the chest shows no evidence of pulmonary embolism. Cardiac  chambers are mildly enlarged, mildly increased interstitial densities in both lungs which may reflect low-grade CHF. There is no alveolar pneumonia, no evidence of pleural effusion or pneumothorax.  Admitting BNP is 12. Glucose 86, BUN 10, creatinine 0.74, sodium 138, potassium 3.2, chloride 101, CO2 is 30, calcium 9.4. Total protein 7.6, albumin 3.4, bili total 0.3, alk phos is 203, AST is 18, ALT is 22. Troponin less than 0.02 x 3. WBC 10.5, hemoglobin 12.1, platelet count 516.    CT scan of the chest shows no evidence of PE, cardiac chamber enlargement mildly enlarged, and there are mildly increased interstitial densities consistent with CHF.  ASSESSMENT AND PLAN: The patient is a 64 year old African American female with recent surgery who is presenting with progressive shortness of breath with some vague chest pressure.   1.  Shortness of breath:  Possibly due to CHF, type unknown, very mild. We will give her IV Lasix. We will get an echocardiogram of the heart, cardiac enzymes. The patient also may need evaluation for underlying coronary artery disease. We will ask Cardiology to see if she may need a stress test. 2.  Hypertension:  We will hold HCTZ in light of Lasix.  3.  History of vitamin D deficiency:  We will check a vitamin D level.  4.  Hypokalemia in light of HCTZ therapy:  We will replace her potassium.  5.  History of B12 deficiency.  6.  Miscellaneous:  The patient is already on Xarelto for DVT prophylaxis postop which we will continue.   TIME SPENT: 45 minutes on this H and P.  ____________________________ Chana Bode H. Posey Pronto, MD shp:cb D: 12/27/2012 20:33:08 ET T: 12/27/2012 20:50:32 ET JOB#: [id number]  cc: Nimrat Woolworth H. Posey Pronto, MD, <Dictator> Alric Seton MD ELECTRONICALLY SIGNED 12/28/2012 14:18

## 2014-08-24 NOTE — Discharge Summary (Signed)
PATIENT NAME:  [name] [name], [name] [name] MR#:  [id number] DATE OF BIRTH:  [birth year]  DATE OF ADMISSION:  12/27/2012 DATE OF DISCHARGE:  12/28/2012  DISCHARGE DIAGNOSES: 1.  Acute dyspnea that has resolved.  2.  Hypertension.  3.  Status post left knee revision.   DISCHARGE MEDICATIONS: 1.  Hydrochlorothiazide 25 mg p.o. daily.  2.  Gabapentin 300 mg 2 capsules p.o. at bedtime.  3.  Meclizine 25 mg p.o. t.i.d. as needed for dizziness and vertigo.  4.  Xarelto 10 mg p.o. daily. 5.  Tylenol 650 mg 2 tabs q. 8 hours as needed for pain.   CONSULTATIONS: None.   PROCEDURES: She underwent an echocardiogram that is still pending at this time.   PERTINENT LABORATORY AND STUDIES: On day of discharge, sodium 140, potassium 4, creatinine 0.89, LDL 44, total cholesterol 109, triglycerides 84, HDL of 48. Cardiac enzymes were negative x 3. TSH 1.06. White blood cell count 10.5, hemoglobin 12.1, and platelets 516. Chest x-ray showed no acute pulmonary disease. CT of the chest was negative for PE. It did show some cardiac chambers that were mildly enlarged. Mildly increased interstitial densities in both lung.   BRIEF HOSPITAL COURSE:  Acute dyspnea. The patient initially came in with acute dyspnea status post left total knee revision on 12/07/2012. She was admitted for observation. Cardiac enzymes were negative x 3 as stated above. CT of the chest was negative for PE, negative for infiltrate. It did show some densities that were concerning for possible edema even though the chest x-ray was negative. It did show enlarged portions of the heart; therefore, she underwent an echocardiogram. She is asymptomatic on day of discharge; therefore, she will be discharged to home. She can follow up with the echo results later if she is symptomatic or has abnormalities with the echo. We will follow up with Cardiology as an outpatient. I did speak with Dr. Nehemiah Massed about this patient. He agreed with the plan above.    DISPOSITION: She is in stable condition and will be discharged to home.   FOLLOWUP: Follow up with Dr. Netty Starring if needed in 10 to 14 days.    ____________________________ Dion Body, MD kl:dp D: 12/28/2012 10:17:45 ET T: 12/28/2012 10:48:57 ET JOB#: [id number]  cc: Dion Body, MD, <Dictator> Dion Body MD ELECTRONICALLY SIGNED 01/23/2013 10:47

## 2014-08-26 NOTE — Op Note (Signed)
PATIENT NAME:  [name] [name], [name] [name] MR#:  [id number] DATE OF BIRTH:  14-Aug-1950  DATE OF PROCEDURE:  09/16/2011  PREOPERATIVE DIAGNOSIS: Carpal tunnel syndrome, right wrist.   POSTOPERATIVE DIAGNOSIS: Carpal tunnel syndrome, right wrist.   PROCEDURE PERFORMED: Open carpal tunnel release on the right.   SURGEON: Kathrene Alu., M.D.   ANESTHESIA: General.   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 Median nerve compression was documented with nerve conduction studies.   The patient was ultimately brought in for an open release of her stenosed right carpal tunnel space because of persistent symptoms.   DESCRIPTION OF PROCEDURE: The patient was taken the Operating Room where satisfactory general anesthesia was achieved. A tourniquet was applied to her right upper arm. The right upper extremity was then prepped and draped in the usual fashion for a wrist procedure. Next, the right upper extremity was exsanguinated and the tourniquet was inflated. A slightly curved incision was made over the volar aspect of the distal half of the carpal tunnel. Dissection was carried down through the subcutaneous tissue onto the palmar fascia. The distal edge of the transverse carpal ligament was identified and a hemostat was inserted underneath the ligament to protect the median nerve and then the ligament was completely divided. The distal half was divided with a knife and the proximal half with scissors. Inspection of the carpal tunnel space subsequent to division of the transverse carpal ligament failed to reveal any mass or evidence for proliferative synovitis.   The median nerve was possibly slightly flattened.   I did inject the subcutaneous tissue about the wound with 7 to 8 mL of 0.25% Marcaine without epinephrine. The tourniquet was released. It was up about 8 minutes. Bleeding was controlled with digital  pressure. The wound was irrigated with GU irrigant.   I then closed the skin with 4-0 nylon sutures in vertical mattress fashion. Betadine was applied to the wound followed by a sterile dressing. A fiberglass volar splint was then applied to the wrist in neutral position.   The patient was awakened and transferred to her stretcher bed. She was taken to the recovery room in satisfactory condition. Blood loss was negligible. ____________________________ Kathrene Alu., MD hbk:slb D: 09/16/2011 15:03:45 ET T: 09/16/2011 15:34:23 ET JOB#: [id number]  cc: Kathrene Alu., MD, <Dictator> Vilinda Flake, Brooke Bonito MD ELECTRONICALLY SIGNED 09/23/2011 18:59

## 2014-09-03 ENCOUNTER — Inpatient Hospital Stay: Admission: RE | Admit: 2014-09-03 | Payer: No Typology Code available for payment source | Source: Ambulatory Visit

## 2014-09-03 ENCOUNTER — Ambulatory Visit: Payer: Medicare Other | Attending: Family Medicine

## 2014-09-03 ENCOUNTER — Ambulatory Visit: Admission: RE | Admit: 2014-09-03 | Payer: Medicare Other | Source: Ambulatory Visit

## 2014-10-23 IMAGING — CR DG CHEST 2V
2 series · 2 of 2 positions shown · non-contrast
Comparison: None.

CLINICAL DATA: Preop for left knee revision

CHEST - 2 VIEW

[w chest pa]
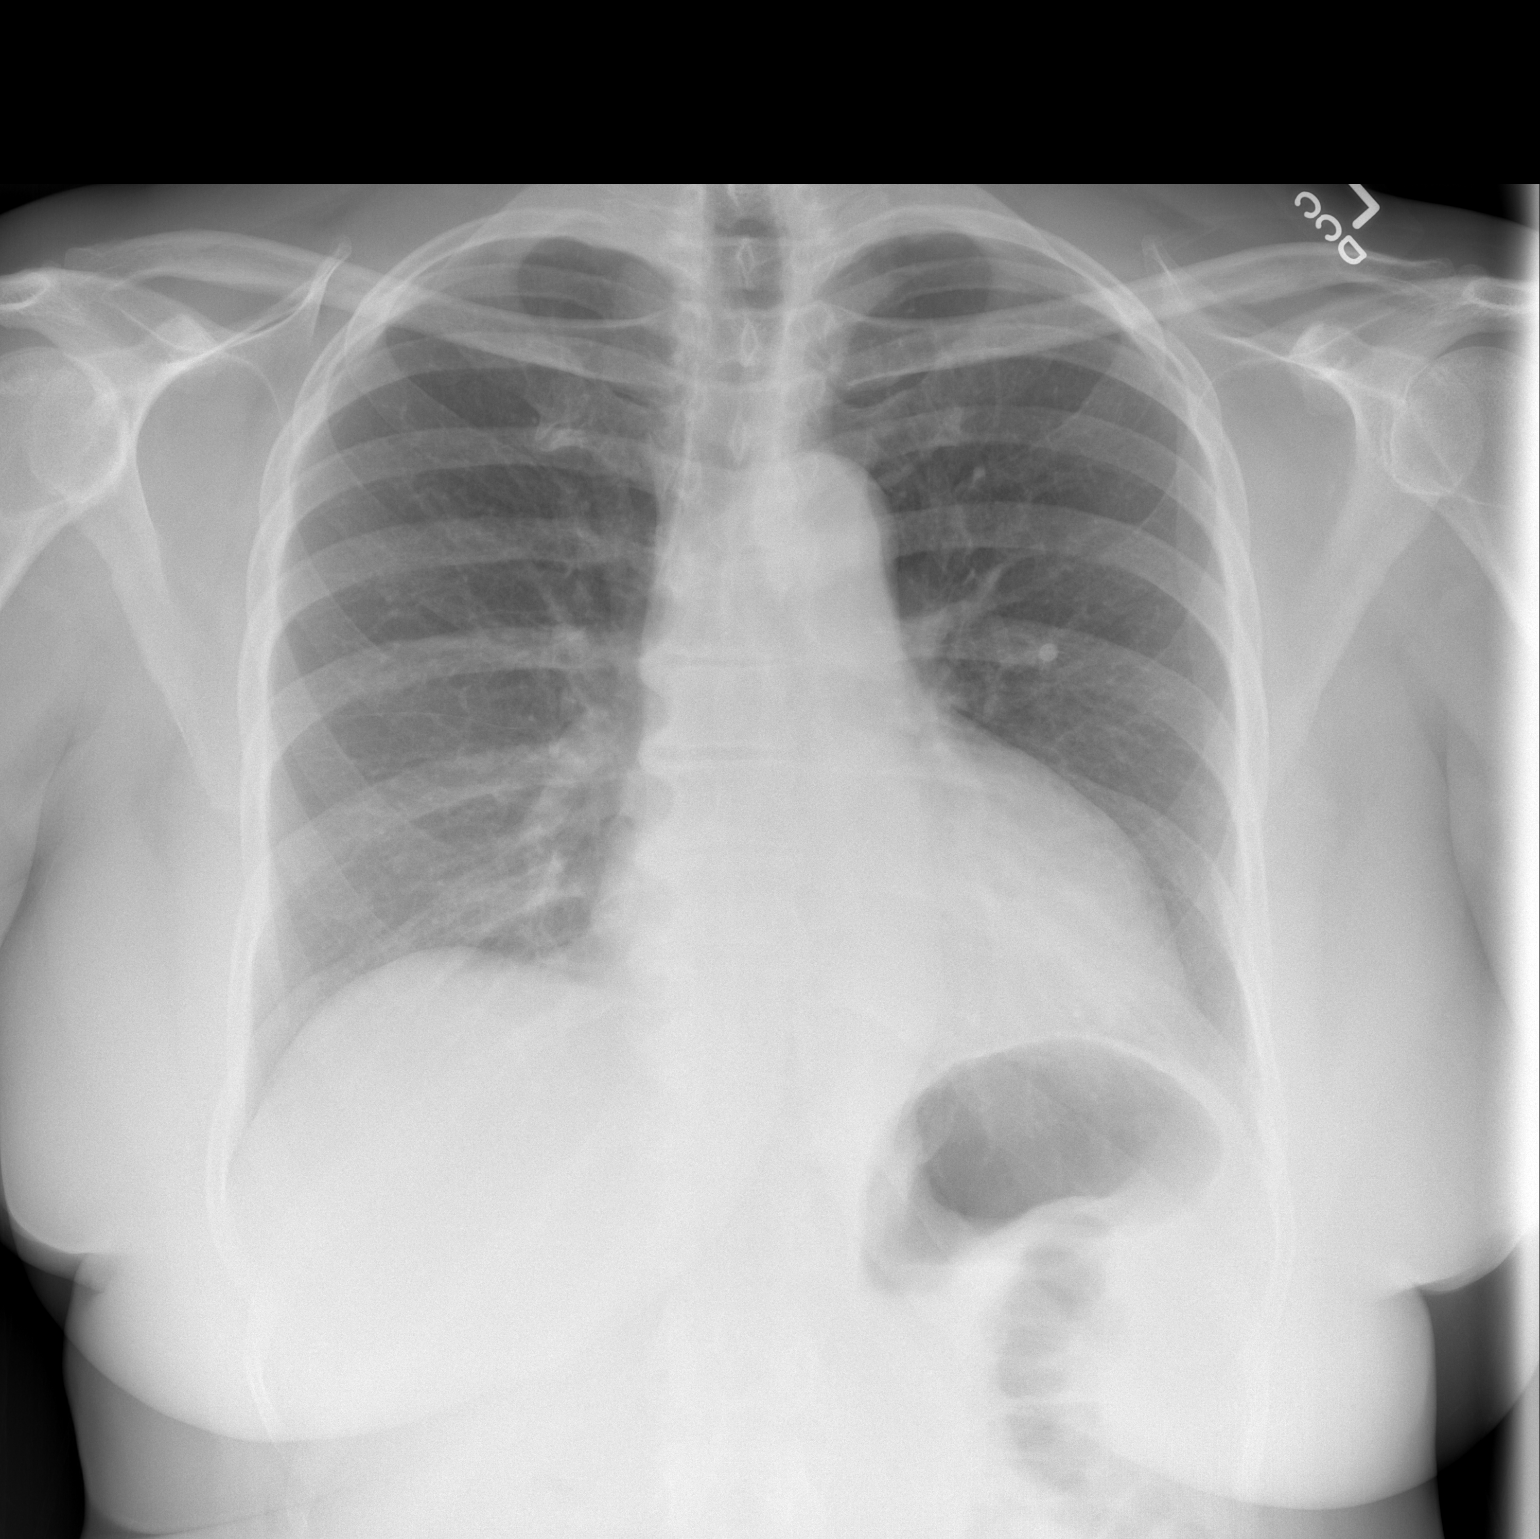

[w chest lat]
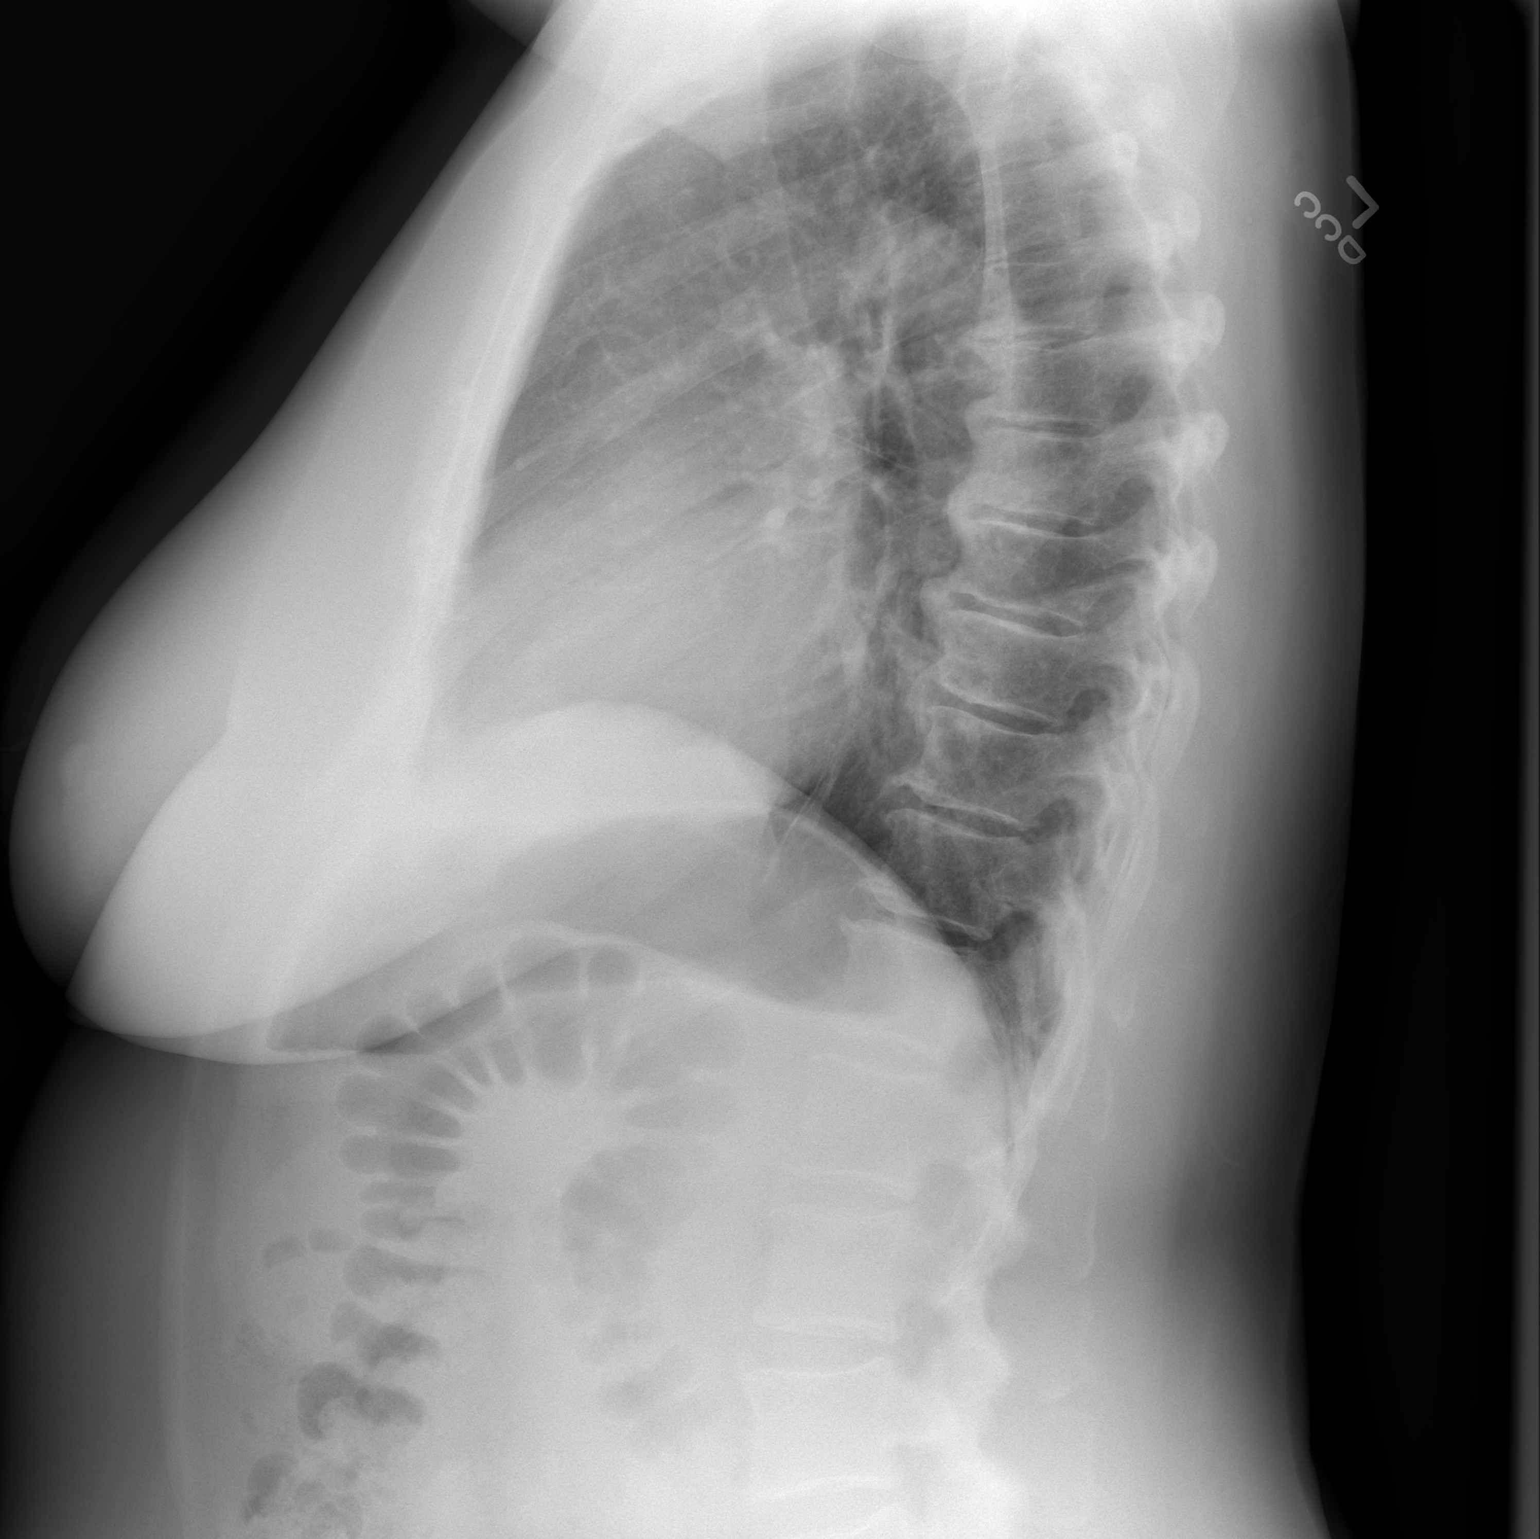

[2 of 2 positions shown; findings below may reference images not displayed]

FINDINGS: Cardiomediastinal silhouette is unremarkable.
Degenerative changes mid and lower thoracic spine.  No acute
infiltrate or pulmonary edema.  Metallic fixation plate noted
cervical spine.
IMPRESSION: No active disease.  Degenerative changes thoracic spine.

## 2015-01-10 ENCOUNTER — Ambulatory Visit: Payer: Medicare Other | Attending: Orthopedic Surgery | Admitting: Physical Therapy

## 2015-01-10 DIAGNOSIS — M25562 Pain in left knee: Secondary | ICD-10-CM | POA: Insufficient documentation

## 2015-01-10 DIAGNOSIS — M25662 Stiffness of left knee, not elsewhere classified: Secondary | ICD-10-CM | POA: Insufficient documentation

## 2015-01-10 DIAGNOSIS — M6281 Muscle weakness (generalized): Secondary | ICD-10-CM | POA: Insufficient documentation

## 2015-01-10 DIAGNOSIS — R262 Difficulty in walking, not elsewhere classified: Secondary | ICD-10-CM | POA: Insufficient documentation

## 2015-01-11 ENCOUNTER — Encounter: Payer: Self-pay | Admitting: Physical Therapy

## 2015-01-11 NOTE — Therapy (Signed)
[facility] [address] [place], [place], [address] Phone: [phone]   Fax:  [phone]  Physical Therapy Evaluation  Patient Details  Name: [name] [name] MRN: [id number] Date of Birth: 1951-01-05 Referring Provider:  Rozelle Logan, Lavon Paganini, MD  Encounter Date: 01/10/2015      PT End of Session - 01/11/15 1420    Visit Number 1   Number of Visits 12   Date for PT Re-Evaluation 02/21/15   Authorization - Visit Number 1   Authorization - Number of Visits 10   PT Start Time 7628   PT Stop Time 1155   PT Time Calculation (min) 64 min   Activity Tolerance Patient tolerated treatment well;Patient limited by pain   Behavior During Therapy [facility] for tasks assessed/performed      Past Medical History  Diagnosis Date  . Hypertension   . Arthritis   . Vertigo     hx post op  . Complication of anesthesia     terrible vertigo  . PONV (postoperative nausea and vomiting)     related to vertigo  . Shortness of breath     WITH EXERTION    Past Surgical History  Procedure Laterality Date  . Back surgery  2010    cervical fusion  . Foot surgery Left     heel---STATES METAL IN THE HEEL - FOOT TURNS OUTWARD  . Carpal tunnel release Right   . Breast surgery Left     biopsy  . Total knee revision Left 12/07/2012    Procedure:  TOTAL KNEE ARTHROPLASTY REVISION;  Surgeon: Gearlean Alf, MD;  Location: [facility];  Service: Orthopedics;  Laterality: Left;  . Knee closed reduction Left 02/15/2013    Procedure: CLOSED MANIPULATION LEFT KNEE;  Surgeon: Gearlean Alf, MD;  Location: [facility];  Service: Orthopedics;  Laterality: Left;  . Heel spur surgery    . Joint replacement Left 2013    knee  . Knee arthrotomy Left 05/29/2013    Procedure: LEFT KNEE ARTHROTOMY WITH SCAR EXCISION;  Surgeon: Gearlean Alf, MD;  Location: [facility];  Service: Orthopedics;  Laterality: Left;  . Knee arthrotomy Left 03/05/2014    Procedure: LEFT KNEE  ARTHROTOMY/SCAR EXCISION/POLYETHYLENE REVISION;  Surgeon: Gearlean Alf, MD;  Location: [facility];  Service: Orthopedics;  Laterality: Left;    There were no vitals filed for this visit.  Visit Diagnosis:  Joint stiffness of knee, left  Muscle weakness  Pain in left knee  Difficulty walking      Subjective Assessment - 01/11/15 1010    Subjective Pt. reports 0/10 knee pain currently at rest and >5/10 L knee pain with prolonged standing/walking.  Pt. s/p L TKA revision on 11/30/14.  Pt. states L knee immobilizer has been discharge as of yesterday and she is able to start bending knee with PT.  Pt. entered PT clinic with use of RW at this time (not loftstrand crutches).     Limitations Walking;Standing;House hold activities   Patient Stated Goals Increase L knee flexion/ ext./ ambulate with more normalized gait with least assistive device.      Currently in Pain? Yes   Pain Score 5    Pain Location Knee   Pain Orientation Left   Pain Descriptors / Indicators Aching;Discomfort;Tightness   Pain Onset More than a month ago   Pain Frequency Intermittent   Pain Relieving Factors rest            [facility]  PT Assessment - 01/11/15 0001    Assessment   Medical Diagnosis L TKA revision   Onset Date/Surgical Date 11/30/14   Next MD Visit 6 weeks   Prior Therapy pt. know well to PT from previous TKA   Precautions   Precautions None   Restrictions   Weight Bearing Restrictions No   Balance Screen   Has the patient fallen in the past 6 months No   Is the patient reluctant to leave their home because of a fear of falling?  No      LEFS: 40 out of 80.   There.ex: Nustep seat #13 L6 10 min.  Reviewed HEP.  Manual tx: supine/ seated L knee flexion PROM 10x with static holds (85 deg.).  Supine L knee AA/PROM flexion/ ext. 5x each.  Patellar mobs. All planes/  Scar massage and instruction provided.           PT Education - 01/11/15 1420    Education provided Yes   Education Details  Reviewed HEP   Person(s) Educated Patient   Methods Explanation;Demonstration   Comprehension Verbalized understanding;Returned demonstration            PT Long Term Goals - 01/11/15 1527    PT LONG TERM GOAL #1   Title Pt. I with HEP to increase L knee AROM (-5 to 90 deg.) to improve standing/ functional mobility.     Baseline L knee AROM -12 to 72 deg.    Time 6   Period Weeks   Status New   PT LONG TERM GOAL #2   Title Pt. will increase LEFS to >55 out of 80 to improve pain-free mobility.     Baseline LEFS: 40 out of 80 (01/10/15).     Time 6   Period Weeks   Status New   PT LONG TERM GOAL #3   Title Pt. able to ambulate community distances with no assistive device to improve functional mobility.    Baseline pt. requires use of RW with all walking at this time.    Time 6   Period Weeks   Status New   PT LONG TERM GOAL #4   Title Pt. able to complete household chores on a daily basis with no increase c/o L knee pain or assistive device safely.     Time 6   Period Weeks   Status New            Plan - 01/11/15 1421    Clinical Impression Statement Pt. is a pleasant 64 y/o female s/p L TKA revision after undergoing multiple L TKA/ surgeries in past with limitations in ROM/ pain.  Pt. reports minimal pain (0-1/10) currently at rest and >5/10 L knee pain with prolonged standing/walking.  R knee AROM (-4 to 128 deg.), L knee (-12 to 72 deg.) in supine position.  L knee ext. PROM (-6 deg.)- pain limited.  L knee flexion PROM (in sitting): 85 deg.  Pt. is very motivated and hardworking in past PT sessions with increasing knee ROM/ strength to improve gait.  Pt. currently ambulates with use of RW and recip. step pattern with consistent heel strike/ wt. bearing.  Pt. was previously use of B loftstrand crutches due to significant L knee ext. limitations.  Moderate L knee swelling: L/R at joint line (48.5/38.5 cm.), distal quad (46/43.5 cm.), mid-calf (35/33 deg.).  Pt. will benefit  from skilled PT services to increase L knee ROM/ stability to improve pain-free functional mobility/ independence with gait.  Pt will benefit from skilled therapeutic intervention in order to improve on the following deficits Abnormal gait;Decreased endurance;Hypomobility;Increased edema;Decreased scar mobility;Decreased activity tolerance;Decreased strength;Increased fascial restricitons;Pain;Difficulty walking;Decreased mobility;Decreased balance;Decreased range of motion;Improper body mechanics;Postural dysfunction;Impaired flexibility   Rehab Potential Good   PT Frequency 2x / week   PT Duration 6 weeks   PT Treatment/Interventions ADLs/Self Care Home Management;Neuromuscular re-education;Scar mobilization;Aquatic Therapy;Cryotherapy;Electrical Stimulation;Moist Heat;Balance training;Therapeutic exercise;Therapeutic activities;Functional mobility training;Stair training;Gait training;Patient/family education;Manual techniques;Energy conservation;Passive range of motion   PT Next Visit Plan Reassess ROM/ HEP   PT Home Exercise Plan see handouts   Consulted and Agree with Plan of Care Patient          G-Codes - 2015/01/23 1531    Functional Assessment Tool Used LEFS/ clinical judgement/ pain/ gait   Functional Limitation Mobility: Walking and moving around   Mobility: Walking and Moving Around Current Status (L4917) At least 40 percent but less than 60 percent impaired, limited or restricted   Mobility: Walking and Moving Around Goal Status (H1505) At least 1 percent but less than 20 percent impaired, limited or restricted       Problem List Patient Active Problem List   Diagnosis Date Noted  . Arthrofibrosis of knee joint 03/05/2014  . Hypokalemia 05/30/2013  . OA (osteoarthritis) of knee 05/29/2013  . Postoperative stiffness of total knee replacement 02/15/2013  . Postoperative anemia due to acute blood loss 12/09/2012  . 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 [address] [place], [place], [address] Phone: [phone]   Fax:  [phone]

## 2015-01-14 ENCOUNTER — Ambulatory Visit: Payer: Medicare Other | Admitting: Physical Therapy

## 2015-01-14 DIAGNOSIS — R262 Difficulty in walking, not elsewhere classified: Secondary | ICD-10-CM

## 2015-01-14 DIAGNOSIS — M25662 Stiffness of left knee, not elsewhere classified: Secondary | ICD-10-CM | POA: Diagnosis not present

## 2015-01-14 DIAGNOSIS — M25562 Pain in left knee: Secondary | ICD-10-CM

## 2015-01-14 DIAGNOSIS — M6281 Muscle weakness (generalized): Secondary | ICD-10-CM

## 2015-01-14 NOTE — Therapy (Signed)
[facility] [address] [place], [place], [address] Phone: [phone]   Fax:  [phone]  Physical Therapy Treatment  Patient Details  Name: [name] [name] MRN: [id number] Date of Birth: 15-Nov-1950 Referring Provider:  Rozelle Logan, Lavon Paganini, MD  Encounter Date: 01/14/2015      PT End of Session - 01/14/15 1521    Visit Number 2   Number of Visits 12   Date for PT Re-Evaluation 02/21/15   Authorization - Visit Number 2   Authorization - Number of Visits 10   PT Start Time 1031   PT Stop Time 1119   PT Time Calculation (min) 48 min   Activity Tolerance Patient tolerated treatment well;Patient limited by pain   Behavior During Therapy [facility] for tasks assessed/performed      Past Medical History  Diagnosis Date  . Hypertension   . Arthritis   . Vertigo     hx post op  . Complication of anesthesia     terrible vertigo  . PONV (postoperative nausea and vomiting)     related to vertigo  . Shortness of breath     WITH EXERTION    Past Surgical History  Procedure Laterality Date  . Back surgery  2010    cervical fusion  . Foot surgery Left     heel---STATES METAL IN THE HEEL - FOOT TURNS OUTWARD  . Carpal tunnel release Right   . Breast surgery Left     biopsy  . Total knee revision Left 12/07/2012    Procedure:  TOTAL KNEE ARTHROPLASTY REVISION;  Surgeon: Gearlean Alf, MD;  Location: [facility];  Service: Orthopedics;  Laterality: Left;  . Knee closed reduction Left 02/15/2013    Procedure: CLOSED MANIPULATION LEFT KNEE;  Surgeon: Gearlean Alf, MD;  Location: [facility];  Service: Orthopedics;  Laterality: Left;  . Heel spur surgery    . Joint replacement Left 2013    knee  . Knee arthrotomy Left 05/29/2013    Procedure: LEFT KNEE ARTHROTOMY WITH SCAR EXCISION;  Surgeon: Gearlean Alf, MD;  Location: [facility];  Service: Orthopedics;  Laterality: Left;  . Knee arthrotomy Left 03/05/2014    Procedure: LEFT KNEE  ARTHROTOMY/SCAR EXCISION/POLYETHYLENE REVISION;  Surgeon: Gearlean Alf, MD;  Location: [facility];  Service: Orthopedics;  Laterality: Left;    There were no vitals filed for this visit.  Visit Diagnosis:  Joint stiffness of knee, left  Muscle weakness  Pain in left knee  Difficulty walking      Subjective Assessment - 01/14/15 1035    Subjective Pt. reports no L knee pain at this time.  Pt. entered PT clinic with use of SPC and 2-point gait pattern.  Pt. states she always feels stiffness in L knee, especially in morning.    Limitations Walking;Standing;House hold activities   Patient Stated Goals Increase L knee flexion/ ext./ ambulate with more normalized gait with least assistive device.      Currently in Pain? No/denies       OBJECTIVE: There.ex.: Nustep L6 10 min. Seat #14-12 with B UE/LE.  TG knee flexion 30x with static holds as tolerated (around 85 deg. L knee flexion).  TG heel raises 20x (difficulty/ fatigue reported).  Reviewed HEP in depth (quad sets with manual feedback).  Manual tx.: Seated L knee flexion contract/relax 5x with static holds at tolerable end-range.  Seated L knee extension on PT knee with patellar mobs. (all planes).  Discussed scar  massage.  Seated on blue mat table with L knee flexion AA/PROM 5x with cuing to keep L hip/buttocks on mat table (prevent R lateral leaning).       Pt response for medical necessity:  Pain limited in L distal quad with knee flexion.  Quad muscle fasciculations noted during quad/ gastroc muscle activation.  Improving gait pattern/ mod. Independence with use of SPC.          PT Long Term Goals - 01/11/15 1527    PT LONG TERM GOAL #1   Title Pt. I with HEP to increase L knee AROM (-5 to 90 deg.) to improve standing/ functional mobility.     Baseline L knee AROM -12 to 72 deg.    Time 6   Period Weeks   Status New   PT LONG TERM GOAL #2   Title Pt. will increase LEFS to >55 out of 80 to improve pain-free mobility.      Baseline LEFS: 40 out of 80 (01/10/15).     Time 6   Period Weeks   Status New   PT LONG TERM GOAL #3   Title Pt. able to ambulate community distances with no assistive device to improve functional mobility.    Baseline pt. requires use of RW with all walking at this time.    Time 6   Period Weeks   Status New   PT LONG TERM GOAL #4   Title Pt. able to complete household chores on a daily basis with no increase c/o L knee pain or assistive device safely.     Time 6   Period Weeks   Status New       Problem List Patient Active Problem List   Diagnosis Date Noted  . Arthrofibrosis of knee joint 03/05/2014  . Hypokalemia 05/30/2013  . OA (osteoarthritis) of knee 05/29/2013  . Postoperative stiffness of total knee replacement 02/15/2013  . Postoperative anemia due to acute blood loss 12/09/2012  . 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

## 2015-01-16 ENCOUNTER — Ambulatory Visit: Payer: Medicare Other | Admitting: Physical Therapy

## 2015-01-16 DIAGNOSIS — M25562 Pain in left knee: Secondary | ICD-10-CM

## 2015-01-16 DIAGNOSIS — R262 Difficulty in walking, not elsewhere classified: Secondary | ICD-10-CM

## 2015-01-16 DIAGNOSIS — M25662 Stiffness of left knee, not elsewhere classified: Secondary | ICD-10-CM | POA: Diagnosis not present

## 2015-01-16 DIAGNOSIS — M6281 Muscle weakness (generalized): Secondary | ICD-10-CM

## 2015-01-17 ENCOUNTER — Encounter: Payer: Self-pay | Admitting: Physical Therapy

## 2015-01-17 NOTE — Therapy (Signed)
[facility] [address]. [place], [place], [address] Phone: [phone]   Fax:  [phone]  Physical Therapy Treatment  Patient Details  Name: [name] [name] MRN: [id number] Date of Birth: 1951-01-15 Referring Provider:  Rozelle Logan, Lavon Paganini, MD  Encounter Date: 01/16/2015      PT End of Session - 01/17/15 1418    Visit Number 3   Number of Visits 12   Date for PT Re-Evaluation 02/21/15   Authorization - Visit Number 3   Authorization - Number of Visits 10   PT Start Time 1017   PT Stop Time 1109   PT Time Calculation (min) 52 min   Activity Tolerance Patient tolerated treatment well;Patient limited by pain   Behavior During Therapy [facility] for tasks assessed/performed      Past Medical History  Diagnosis Date  . Hypertension   . Arthritis   . Vertigo     hx post op  . Complication of anesthesia     terrible vertigo  . PONV (postoperative nausea and vomiting)     related to vertigo  . Shortness of breath     WITH EXERTION    Past Surgical History  Procedure Laterality Date  . Back surgery  2010    cervical fusion  . Foot surgery Left     heel---STATES METAL IN THE HEEL - FOOT TURNS OUTWARD  . Carpal tunnel release Right   . Breast surgery Left     biopsy  . Total knee revision Left 12/07/2012    Procedure:  TOTAL KNEE ARTHROPLASTY REVISION;  Surgeon: Gearlean Alf, MD;  [facility]: [facility];  Service: Orthopedics;  Laterality: Left;  . Knee closed reduction Left 02/15/2013    Procedure: CLOSED MANIPULATION LEFT KNEE;  Surgeon: Gearlean Alf, MD;  [facility]: [facility];  Service: Orthopedics;  Laterality: Left;  . Heel spur surgery    . Joint replacement Left 2013    knee  . Knee arthrotomy Left 05/29/2013    Procedure: LEFT KNEE ARTHROTOMY WITH SCAR EXCISION;  Surgeon: Gearlean Alf, MD;  [facility]: [facility];  Service: Orthopedics;  Laterality: Left;  . Knee arthrotomy Left 03/05/2014    Procedure: LEFT KNEE  ARTHROTOMY/SCAR EXCISION/POLYETHYLENE REVISION;  Surgeon: Gearlean Alf, MD;  [facility]: [facility];  Service: Orthopedics;  Laterality: Left;    There were no vitals filed for this visit.  Visit Diagnosis:  Joint stiffness of knee, left  Muscle weakness  Pain in left knee  Difficulty walking      Subjective Assessment - 01/17/15 1411    Subjective Pt. states her L knee was swollen after last PT tx. session but swlling improved after icing/rest.  No c/o L knee pain at this time and pt. working hard with HEP/ daily functional mobility.    Limitations Walking;Standing;House hold activities   Patient Stated Goals Increase L knee flexion/ ext./ ambulate with more normalized gait with least assistive device.      Currently in Pain? No/denies      OBJECTIVE: There.ex.: Nustep L6-7 10 min. Seat #13-11 (increase flexion) with B UE/LE. Discussed HEP (squats/ partial lunges).  2nd step L knee flexion 10x with static holds to increase flexion with B UE assist.  Step ups/ down on 6" step 10x2. Seated LAQ with gentle manual resistance and assist for knee ext. With cuing for quad muscle control.   Manual tx.: Seated on blue mat table L knee flexion contract/relax 5x with static holds  at tolerable end-range. Seated L knee extension on PT knee with patellar mobs. (all planes). Seated on blue mat table with L knee flexion AA/PROM 5x with cuing to keep L hip/buttocks on mat table (prevent R lateral leaning).   Pt response for medical necessity: Pain limited in L distal quad with knee flexion. Quad muscle fasciculations noted during seated LAQ. Improving gait pattern/ mod. Independence with use of SPC.        PT Long Term Goals - 01/11/15 1527    PT LONG TERM GOAL #1   Title Pt. I with HEP to increase L knee AROM (-5 to 90 deg.) to improve standing/ functional mobility.     Baseline L knee AROM -12 to 72 deg.    Time 6   Period Weeks   Status New   PT LONG TERM GOAL #2   Title  Pt. will increase LEFS to >55 out of 80 to improve pain-free mobility.     Baseline LEFS: 40 out of 80 (01/10/15).     Time 6   Period Weeks   Status New   PT LONG TERM GOAL #3   Title Pt. able to ambulate community distances with no assistive device to improve functional mobility.    Baseline pt. requires use of RW with all walking at this time.    Time 6   Period Weeks   Status New   PT LONG TERM GOAL #4   Title Pt. able to complete household chores on a daily basis with no increase c/o L knee pain or assistive device safely.     Time 6   Period Weeks   Status New            Plan - 01/17/15 1418    Clinical Impression Statement L knee flexion remains pain limited at 88 deg. AA/PROM with limited quad muscle contraction during SAQ/LAQ movement.  Mod. I with use of SPC and 2-point gait pattern for safe ambulation.  Good muscle endurance and pt. will remain focused on L knee ext./ flexion with quad contraction at home.    Pt will benefit from skilled therapeutic intervention in order to improve on the following deficits Abnormal gait;Decreased endurance;Hypomobility;Increased edema;Decreased scar mobility;Decreased activity tolerance;Decreased strength;Increased fascial restricitons;Pain;Difficulty walking;Decreased mobility;Decreased balance;Decreased range of motion;Improper body mechanics;Postural dysfunction;Impaired flexibility   Rehab Potential Good   PT Frequency 2x / week   PT Duration 6 weeks   PT Treatment/Interventions ADLs/Self Care Home Management;Neuromuscular re-education;Scar mobilization;Aquatic Therapy;Cryotherapy;Electrical Stimulation;Moist Heat;Balance training;Therapeutic exercise;Therapeutic activities;Functional mobility training;Stair training;Gait training;Patient/family education;Manual techniques;Energy conservation;Passive range of motion   PT Next Visit Plan Progress L knee/quad strength   PT Home Exercise Plan see handouts   Consulted and Agree with Plan of  Care Patient        Problem List Patient Active Problem List   Diagnosis Date Noted  . Arthrofibrosis of knee joint 03/05/2014  . Hypokalemia 05/30/2013  . OA (osteoarthritis) of knee 05/29/2013  . Postoperative stiffness of total knee replacement 02/15/2013  . Postoperative anemia due to acute blood loss 12/09/2012  . 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

## 2015-01-21 ENCOUNTER — Encounter: Payer: Self-pay | Admitting: Physical Therapy

## 2015-01-21 ENCOUNTER — Ambulatory Visit: Payer: Medicare Other | Admitting: Physical Therapy

## 2015-01-21 DIAGNOSIS — R262 Difficulty in walking, not elsewhere classified: Secondary | ICD-10-CM

## 2015-01-21 DIAGNOSIS — M25662 Stiffness of left knee, not elsewhere classified: Secondary | ICD-10-CM | POA: Diagnosis not present

## 2015-01-21 DIAGNOSIS — M6281 Muscle weakness (generalized): Secondary | ICD-10-CM

## 2015-01-21 DIAGNOSIS — M25562 Pain in left knee: Secondary | ICD-10-CM

## 2015-01-21 NOTE — Therapy (Signed)
[facility] [address] [place], [place], [address] Phone: [phone]   Fax:  [phone]  Physical Therapy Treatment  Patient Details  Name: [name] [name] MRN: [id number] Date of Birth: [birth year] Referring Provider:  Rozelle Logan, Lavon Paganini, MD  Encounter Date: 01/21/2015      PT End of Session - 01/21/15 1248    Visit Number 4   Number of Visits 12   Date for PT Re-Evaluation 02/21/15   Authorization - Visit Number 4   Authorization - Number of Visits 10   PT Start Time 1022   PT Stop Time 1120   PT Time Calculation (min) 58 min   Activity Tolerance Patient tolerated treatment well   Behavior During Therapy [facility] for tasks assessed/performed      Past Medical History  Diagnosis Date  . Hypertension   . Arthritis   . Vertigo     hx post op  . Complication of anesthesia     terrible vertigo  . PONV (postoperative nausea and vomiting)     related to vertigo  . Shortness of breath     WITH EXERTION    Past Surgical History  Procedure Laterality Date  . Back surgery  2010    cervical fusion  . Foot surgery Left     heel---STATES METAL IN THE HEEL - FOOT TURNS OUTWARD  . Carpal tunnel release Right   . Breast surgery Left     biopsy  . Total knee revision Left 12/07/2012    Procedure:  TOTAL KNEE ARTHROPLASTY REVISION;  Surgeon: Gearlean Alf, MD;  [facility]: [facility];  Service: Orthopedics;  Laterality: Left;  . Knee closed reduction Left 02/15/2013    Procedure: CLOSED MANIPULATION LEFT KNEE;  Surgeon: Gearlean Alf, MD;  Location: [facility];  Service: Orthopedics;  Laterality: Left;  . Heel spur surgery    . Joint replacement Left 2013    knee  . Knee arthrotomy Left 05/29/2013    Procedure: LEFT KNEE ARTHROTOMY WITH SCAR EXCISION;  Surgeon: Gearlean Alf, MD;  [facility]: [facility];  Service: Orthopedics;  Laterality: Left;  . Knee arthrotomy Left 03/05/2014    Procedure: LEFT KNEE ARTHROTOMY/SCAR  EXCISION/POLYETHYLENE REVISION;  Surgeon: Gearlean Alf, MD;  [facility];  Service: Orthopedics;  Laterality: Left;    There were no vitals filed for this visit.  Visit Diagnosis:  Joint stiffness of knee, left  Muscle weakness  Pain in left knee  Difficulty walking      Subjective Assessment - 01/21/15 1247    Subjective Pt complains of L knee stiffness but no pain upon arrival to PT tx session. Pt reports cutting her grass on the riding mower and weedeating over the weekend.    Limitations Walking;Standing;House hold activities   Patient Stated Goals Increase L knee flexion/ ext./ ambulate with more normalized gait with least assistive device.      Currently in Pain? No/denies      OBJECTIVE: There.ex.: Nustep L6 12 min. Seat #13-11 (increase flexion) with B UE/LE. TG knee flexion 30x with static holds (focusing on L knee control/ maintaining midline).  Seated LAQ with gentle manual resistance and assist for knee ext. With cuing for quad muscle control.  Walking in clinic with cuing for L heel strike/ wt. Shifting/ upright posture with and without SPC. Manual tx.: Seated on blue mat table L knee flexion contract/relax 5x with static holds at tolerable end-range. Seated L knee extension on  PT knee with patellar mobs. (all planes). Seated on blue mat table with L knee flexion AA/PROM 5x with cuing to keep L hip/buttocks on mat table (prevent R lateral leaning).   Pt response for medical necessity: Pain limited in L distal quad with knee flexion. Quad muscle fasciculations noted during seated LAQ. Improving gait pattern/ mod. Independence with use of SPC. Increase L knee flexion to 88 deg. (pain tolerable).        PT Long Term Goals - 01/11/15 1527    PT LONG TERM GOAL #1   Title Pt. I with HEP to increase L knee AROM (-5 to 90 deg.) to improve standing/ functional mobility.     Baseline L knee AROM -12 to 72 deg.    Time 6   Period Weeks   Status  New   PT LONG TERM GOAL #2   Title Pt. will increase LEFS to >55 out of 80 to improve pain-free mobility.     Baseline LEFS: 40 out of 80 (01/10/15).     Time 6   Period Weeks   Status New   PT LONG TERM GOAL #3   Title Pt. able to ambulate community distances with no assistive device to improve functional mobility.    Baseline pt. requires use of RW with all walking at this time.    Time 6   Period Weeks   Status New   PT LONG TERM GOAL #4   Title Pt. able to complete household chores on a daily basis with no increase c/o L knee pain or assistive device safely.     Time 6   Period Weeks   Status New               Plan - 01/21/15 1249    Clinical Impression Statement Pt ambulates with SPC and consistent reciprocal gait pattern.  Pt. able to ambulate short distances with no assistive device but requires verbal cues to consistently have L heel strike on initial contact with the ground.  Pt is improving with strengthening of L quad but continues to have moderate quad lag with seated LAQ that requires moderate assistance. Pts L knee AROM: -8 degrees of extension in supine; 88 degress of flexion in sitting (marked improvement).    Pt will benefit from skilled therapeutic intervention in order to improve on the following deficits Abnormal gait;Decreased endurance;Hypomobility;Increased edema;Decreased scar mobility;Decreased activity tolerance;Decreased strength;Increased fascial restricitons;Pain;Difficulty walking;Decreased mobility;Decreased balance;Decreased range of motion;Improper body mechanics;Postural dysfunction;Impaired flexibility   Rehab Potential Good   PT Frequency 2x / week   PT Duration 6 weeks   PT Treatment/Interventions ADLs/Self Care Home Management;Neuromuscular re-education;Scar mobilization;Aquatic Therapy;Cryotherapy;Electrical Stimulation;Moist Heat;Balance training;Therapeutic exercise;Therapeutic activities;Functional mobility training;Stair training;Gait  training;Patient/family education;Manual techniques;Energy conservation;Passive range of motion   PT Next Visit Plan Progress L knee/quad strength   PT Home Exercise Plan see handouts   Consulted and Agree with Plan of Care Patient        Problem List Patient Active Problem List   Diagnosis Date Noted  . Arthrofibrosis of knee joint 03/05/2014  . Hypokalemia 05/30/2013  . OA (osteoarthritis) of knee 05/29/2013  . Postoperative stiffness of total knee replacement 02/15/2013  . Postoperative anemia due to acute blood loss 12/09/2012  . 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 [place], [place], [address] Phone: [phone]   Fax:  [phone]

## 2015-01-23 ENCOUNTER — Ambulatory Visit: Payer: Medicare Other | Admitting: Physical Therapy

## 2015-01-23 DIAGNOSIS — M25662 Stiffness of left knee, not elsewhere classified: Secondary | ICD-10-CM

## 2015-01-23 DIAGNOSIS — M25562 Pain in left knee: Secondary | ICD-10-CM

## 2015-01-23 DIAGNOSIS — M6281 Muscle weakness (generalized): Secondary | ICD-10-CM

## 2015-01-23 DIAGNOSIS — R262 Difficulty in walking, not elsewhere classified: Secondary | ICD-10-CM

## 2015-01-24 ENCOUNTER — Encounter: Payer: Self-pay | Admitting: Physical Therapy

## 2015-01-24 NOTE — Therapy (Signed)
[place] [facility] [address] [place], [place], [address] Phone: [phone]   Fax:  [phone]  Physical Therapy Treatment  Patient Details  Name: [name] [name] MRN: [id number] Date of Birth: [birth year] Referring Provider:  Rozelle Logan, Lavon Paganini, MD  Encounter Date: 01/23/2015      PT End of Session - 01/24/15 0949    Visit Number 5   Number of Visits 12   Date for PT Re-Evaluation 02/21/15   Authorization - Visit Number 5   Authorization - Number of Visits 10   PT Start Time 1117   PT Stop Time 1202   PT Time Calculation (min) 45 min   Activity Tolerance Patient tolerated treatment well;Patient limited by pain   Behavior During Therapy [facility] for tasks assessed/performed      Past Medical History  Diagnosis Date  . Hypertension   . Arthritis   . Vertigo     hx post op  . Complication of anesthesia     terrible vertigo  . PONV (postoperative nausea and vomiting)     related to vertigo  . Shortness of breath     WITH EXERTION    Past Surgical History  Procedure Laterality Date  . Back surgery  2010    cervical fusion  . Foot surgery Left     heel---STATES METAL IN THE HEEL - FOOT TURNS OUTWARD  . Carpal tunnel release Right   . Breast surgery Left     biopsy  . Total knee revision Left 12/07/2012    Procedure:  TOTAL KNEE ARTHROPLASTY REVISION;  Surgeon: Gearlean Alf, MD;  [facility]: [facility];  Service: Orthopedics;  Laterality: Left;  . Knee closed reduction Left 02/15/2013    Procedure: CLOSED MANIPULATION LEFT KNEE;  Surgeon: Gearlean Alf, MD;  [facility];  Service: Orthopedics;  Laterality: Left;  . Heel spur surgery    . Joint replacement Left 2013    knee  . Knee arthrotomy Left 05/29/2013    Procedure: LEFT KNEE ARTHROTOMY WITH SCAR EXCISION;  Surgeon: Gearlean Alf, MD;  [facility];  Service: Orthopedics;  Laterality: Left;  . Knee arthrotomy Left 03/05/2014    Procedure: LEFT KNEE  ARTHROTOMY/SCAR EXCISION/POLYETHYLENE REVISION;  Surgeon: Gearlean Alf, MD;  [facility];  Service: Orthopedics;  Laterality: Left;    There were no vitals filed for this visit.  Visit Diagnosis:  Joint stiffness of knee, left  Muscle weakness  Pain in left knee  Difficulty walking      Subjective Assessment - 01/24/15 0949    Subjective Pt complains of L knee stiffness but no pain upon arrival to PT tx session. Pt. arrived 1 hour late to PT appt. due to car issues.     Limitations Walking;Standing;House hold activities   Patient Stated Goals Increase L knee flexion/ ext./ ambulate with more normalized gait with least assistive device.      Currently in Pain? No/denies        OBJECTIVE: There.ex.: Nustep L6 12 min. Seat #13-11 (increase flexion) with B UE/LE. Seated LAQ with gentle manual resistance and assist for knee ext. With cuing for quad muscle control. Walking in clinic with cuing for L heel strike/ wt. Shifting/ upright posture with and without SPC. Manual tx.: Seated on blue mat table L knee flexion contract/relax 8x with static holds at tolerable end-range. Seated L knee extension on PT knee with patellar mobs. (all planes). Seated on blue mat table  with L knee flexion AA/PROM 10x with cuing to keep L hip/buttocks on mat table (prevent R lateral leaning).L knee flexion 88 deg. (significant c/o distal quad/knee pain).   Pt response for medical necessity: Pain limited in L distal quad with knee flexion. Quad muscle fasciculations noted during seated LAQ. Improving gait pattern/ mod. Independence with use of SPC.        PT Long Term Goals - 01/11/15 1527    PT LONG TERM GOAL #1   Title Pt. I with HEP to increase L knee AROM (-5 to 90 deg.) to improve standing/ functional mobility.     Baseline L knee AROM -12 to 72 deg.    Time 6   Period Weeks   Status New   PT LONG TERM GOAL #2   Title Pt. will increase LEFS to >55 out of 80 to improve  pain-free mobility.     Baseline LEFS: 40 out of 80 (01/10/15).     Time 6   Period Weeks   Status New   PT LONG TERM GOAL #3   Title Pt. able to ambulate community distances with no assistive device to improve functional mobility.    Baseline pt. requires use of RW with all walking at this time.    Time 6   Period Weeks   Status New   PT LONG TERM GOAL #4   Title Pt. able to complete household chores on a daily basis with no increase c/o L knee pain or assistive device safely.     Time 6   Period Weeks   Status New            Plan - 01/24/15 0950    Clinical Impression Statement Pt. ambulates with improved heel strike on L with use of SPC as compared to no assistive device.  Pain limited L knee flexion with AA/PROM in seated position.  L knee flexion limited to <88 deg. flexion after contract-relax technique.  Pt. remains highly motivated to increase L knee ROM/ strengthening to progress off of assistive device.    Pt will benefit from skilled therapeutic intervention in order to improve on the following deficits Abnormal gait;Decreased endurance;Hypomobility;Increased edema;Decreased scar mobility;Decreased activity tolerance;Decreased strength;Increased fascial restricitons;Pain;Difficulty walking;Decreased mobility;Decreased balance;Decreased range of motion;Improper body mechanics;Postural dysfunction;Impaired flexibility   Rehab Potential Good   PT Frequency 2x / week   PT Duration 6 weeks   PT Treatment/Interventions ADLs/Self Care Home Management;Neuromuscular re-education;Scar mobilization;Aquatic Therapy;Cryotherapy;Electrical Stimulation;Moist Heat;Balance training;Therapeutic exercise;Therapeutic activities;Functional mobility training;Stair training;Gait training;Patient/family education;Manual techniques;Energy conservation;Passive range of motion   PT Next Visit Plan Progress L knee/quad strength   PT Home Exercise Plan see handouts   Consulted and Agree with Plan of Care  Patient        Problem List Patient Active Problem List   Diagnosis Date Noted  . Arthrofibrosis of knee joint 03/05/2014  . Hypokalemia 05/30/2013  . OA (osteoarthritis) of knee 05/29/2013  . Postoperative stiffness of total knee replacement 02/15/2013  . Postoperative anemia due to acute blood loss 12/09/2012  . 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

## 2015-01-28 ENCOUNTER — Ambulatory Visit: Payer: Medicare Other | Admitting: Physical Therapy

## 2015-01-28 ENCOUNTER — Encounter: Payer: Self-pay | Admitting: Physical Therapy

## 2015-01-28 DIAGNOSIS — R262 Difficulty in walking, not elsewhere classified: Secondary | ICD-10-CM

## 2015-01-28 DIAGNOSIS — M6281 Muscle weakness (generalized): Secondary | ICD-10-CM

## 2015-01-28 DIAGNOSIS — M25662 Stiffness of left knee, not elsewhere classified: Secondary | ICD-10-CM | POA: Diagnosis not present

## 2015-01-28 DIAGNOSIS — M25562 Pain in left knee: Secondary | ICD-10-CM

## 2015-01-28 NOTE — Therapy (Signed)
[place] [facility] [address] [place], [place], [address] Phone: [phone]   Fax:  [phone]  Physical Therapy Treatment  Patient Details  Name: [name] [name] MRN: [id number] Date of Birth: 1950/05/16 Referring Provider:  Rozelle Logan, Lavon Paganini, MD  Encounter Date: 01/28/2015      PT End of Session - 01/28/15 1231    Visit Number 6   Number of Visits 12   Date for PT Re-Evaluation 02/21/15   Authorization - Visit Number 6   Authorization - Number of Visits 10   PT Start Time 1024   PT Stop Time 1115   PT Time Calculation (min) 51 min   Activity Tolerance Patient tolerated treatment well;Patient limited by pain   Behavior During Therapy [facility] for tasks assessed/performed      Past Medical History  Diagnosis Date  . Hypertension   . Arthritis   . Vertigo     hx post op  . Complication of anesthesia     terrible vertigo  . PONV (postoperative nausea and vomiting)     related to vertigo  . Shortness of breath     WITH EXERTION    Past Surgical History  Procedure Laterality Date  . Back surgery  2010    cervical fusion  . Foot surgery Left     heel---STATES METAL IN THE HEEL - FOOT TURNS OUTWARD  . Carpal tunnel release Right   . Breast surgery Left     biopsy  . Total knee revision Left 12/07/2012    Procedure:  TOTAL KNEE ARTHROPLASTY REVISION;  Surgeon: Gearlean Alf, MD;  [facility];  Service: Orthopedics;  Laterality: Left;  . Knee closed reduction Left 02/15/2013    Procedure: CLOSED MANIPULATION LEFT KNEE;  Surgeon: Gearlean Alf, MD;  [facility];  Service: Orthopedics;  Laterality: Left;  . Heel spur surgery    . Joint replacement Left 2013    knee  . Knee arthrotomy Left 05/29/2013    Procedure: LEFT KNEE ARTHROTOMY WITH SCAR EXCISION;  Surgeon: Gearlean Alf, MD;  [facility];  Service: Orthopedics;  Laterality: Left;  . Knee arthrotomy Left 03/05/2014    Procedure: LEFT KNEE  ARTHROTOMY/SCAR EXCISION/POLYETHYLENE REVISION;  Surgeon: Gearlean Alf, MD;  [facility];  Service: Orthopedics;  Laterality: Left;    There were no vitals filed for this visit.  Visit Diagnosis:  Joint stiffness of knee, left  Muscle weakness  Pain in left knee  Difficulty walking      Subjective Assessment - 01/28/15 1230    Subjective Pt reports stiffness in knee but has no complaints of pain. Pt reports being sore after last PT tx session.    Limitations Walking;Standing;House hold activities   Patient Stated Goals Increase L knee flexion/ ext./ ambulate with more normalized gait with least assistive device.      Currently in Pain? No/denies      OBJECTIVE: There.ex.: Nustep L6 10 min. Seat #11-9 (increase flexion) with B UE/LE. TG: 30 reps of knee flexion (pt encouraged to slide down throughout squats. Gait around the clinic with verbal cues to increase L knee flexion and L heel strike. Resisted gait with 2 BTB x 10 each plane. Contract/relax in sitting to increase L knee flexion. Contract-relax with pt. providing tactile cue to quad in sitting to promote extension. Manual: Grade III extension mobilizations to promote increase in L knee extension (with and without blue mobilization belt).  Pt  response for medical necessity: Pain limited in L distal quad and ROM limited by stiffness. Improving gait pattern/ mod. Independence with use of SPC.         PT Long Term Goals - 01/11/15 1527    PT LONG TERM GOAL #1   Title Pt. I with HEP to increase L knee AROM (-5 to 90 deg.) to improve standing/ functional mobility.     Baseline L knee AROM -12 to 72 deg.    Time 6   Period Weeks   Status New   PT LONG TERM GOAL #2   Title Pt. will increase LEFS to >55 out of 80 to improve pain-free mobility.     Baseline LEFS: 40 out of 80 (01/10/15).     Time 6   Period Weeks   Status New   PT LONG TERM GOAL #3   Title Pt. able to ambulate community distances with no  assistive device to improve functional mobility.    Baseline pt. requires use of RW with all walking at this time.    Time 6   Period Weeks   Status New   PT LONG TERM GOAL #4   Title Pt. able to complete household chores on a daily basis with no increase c/o L knee pain or assistive device safely.     Time 6   Period Weeks   Status New        Problem List Patient Active Problem List   Diagnosis Date Noted  . Arthrofibrosis of knee joint 03/05/2014  . Hypokalemia 05/30/2013  . OA (osteoarthritis) of knee 05/29/2013  . Postoperative stiffness of total knee replacement 02/15/2013  . Postoperative anemia due to acute blood loss 12/09/2012  . 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 [place], [place], [address] Phone: [phone]   Fax:  [phone]

## 2015-01-30 ENCOUNTER — Ambulatory Visit: Payer: Medicare Other | Admitting: Physical Therapy

## 2015-01-30 DIAGNOSIS — M25562 Pain in left knee: Secondary | ICD-10-CM

## 2015-01-30 DIAGNOSIS — R262 Difficulty in walking, not elsewhere classified: Secondary | ICD-10-CM

## 2015-01-30 DIAGNOSIS — M25662 Stiffness of left knee, not elsewhere classified: Secondary | ICD-10-CM

## 2015-01-30 DIAGNOSIS — M6281 Muscle weakness (generalized): Secondary | ICD-10-CM

## 2015-01-31 NOTE — Therapy (Signed)
[place] [facility] [address] [place], [place], [address] Phone: [phone]   Fax:  [phone]  Physical Therapy Treatment  Patient Details  Name: [name] [name] MRN: [id number] Date of Birth: 12/18/1950 Referring Provider:  Rozelle Logan, Lavon Paganini, MD  Encounter Date: 01/30/2015      PT End of Session - 01/31/15 0842    Visit Number 7   Number of Visits 12   Date for PT Re-Evaluation 02/21/15   Authorization - Visit Number 7   Authorization - Number of Visits 10   PT Start Time 1034   PT Stop Time 1122   PT Time Calculation (min) 48 min   Activity Tolerance Patient tolerated treatment well;Patient limited by pain   Behavior During Therapy [facility] for tasks assessed/performed      Past Medical History  Diagnosis Date  . Hypertension   . Arthritis   . Vertigo     hx post op  . Complication of anesthesia     terrible vertigo  . PONV (postoperative nausea and vomiting)     related to vertigo  . Shortness of breath     WITH EXERTION    Past Surgical History  Procedure Laterality Date  . Back surgery  2010    cervical fusion  . Foot surgery Left     heel---STATES METAL IN THE HEEL - FOOT TURNS OUTWARD  . Carpal tunnel release Right   . Breast surgery Left     biopsy  . Total knee revision Left 12/07/2012    Procedure:  TOTAL KNEE ARTHROPLASTY REVISION;  Surgeon: Gearlean Alf, MD;  [facility];  Service: Orthopedics;  Laterality: Left;  . Knee closed reduction Left 02/15/2013    Procedure: CLOSED MANIPULATION LEFT KNEE;  Surgeon: Gearlean Alf, MD;  [facility];  Service: Orthopedics;  Laterality: Left;  . Heel spur surgery    . Joint replacement Left 2013    knee  . Knee arthrotomy Left 05/29/2013    Procedure: LEFT KNEE ARTHROTOMY WITH SCAR EXCISION;  Surgeon: Gearlean Alf, MD;  [facility];  Service: Orthopedics;  Laterality: Left;  . Knee arthrotomy Left 03/05/2014    Procedure: LEFT KNEE  ARTHROTOMY/SCAR EXCISION/POLYETHYLENE REVISION;  Surgeon: Gearlean Alf, MD;  [facility];  Service: Orthopedics;  Laterality: Left;    There were no vitals filed for this visit.  Visit Diagnosis:  Joint stiffness of knee, left  Muscle weakness  Pain in left knee  Difficulty walking      Subjective Assessment - 01/31/15 0841    Subjective Pt reports stiffness in knee but has no complaints of pain. Pt reports feeling good with increase L knee extension after last tx. and states even though mob. belt stretches were painful, they helped.     Limitations Walking;Standing;House hold activities   Patient Stated Goals Increase L knee flexion/ ext./ ambulate with more normalized gait with least assistive device.      Currently in Pain? No/denies      OBJECTIVE: There.ex.: Nustep L7 10 min. Seat #11-9 (increase flexion) with B UE/LE. TG: 30 reps of knee flexion (pt encouraged to slide down throughout squats). Gait around the clinic with verbal cues to increase L knee flexion and L heel strike with and without SPC (benefits from [facility]).  Manual tx.:  Contract/relax in sitting to increase L knee flexion. Contract-relax with pt. providing tactile cue to quad in sitting to promote extension.  Grade III  extension mobilizations to promote increase in L knee extension (with and without blue mobilization belt)- 5x.  L knee PROM ext./flexion 10x.  Pt response for medical necessity: Pain limited in L distal quad and ROM limited by stiffness. Improving gait pattern/ mod. Independence with use of SPC.  Decrease R lateral lean with L knee PROM.        PT Long Term Goals - 01/11/15 1527    PT LONG TERM GOAL #1   Title Pt. I with HEP to increase L knee AROM (-5 to 90 deg.) to improve standing/ functional mobility.     Baseline L knee AROM -12 to 72 deg.    Time 6   Period Weeks   Status New   PT LONG TERM GOAL #2   Title Pt. will increase LEFS to >55 out of 80 to improve pain-free  mobility.     Baseline LEFS: 40 out of 80 (01/10/15).     Time 6   Period Weeks   Status New   PT LONG TERM GOAL #3   Title Pt. able to ambulate community distances with no assistive device to improve functional mobility.    Baseline pt. requires use of RW with all walking at this time.    Time 6   Period Weeks   Status New   PT LONG TERM GOAL #4   Title Pt. able to complete household chores on a daily basis with no increase c/o L knee pain or assistive device safely.     Time 6   Period Weeks   Status New            Plan - 01/31/15 1308    Clinical Impression Statement Pt. ambulates with improved heel strike on L with use of SPC on R.  Pt. is progressing with ambulating without assistive device and consistent step pattern/ foot placement but will continue to use SPC for safety/ more normalized gait.     Pt will benefit from skilled therapeutic intervention in order to improve on the following deficits Abnormal gait;Decreased endurance;Hypomobility;Increased edema;Decreased scar mobility;Decreased activity tolerance;Decreased strength;Increased fascial restricitons;Pain;Difficulty walking;Decreased mobility;Decreased balance;Decreased range of motion;Improper body mechanics;Postural dysfunction;Impaired flexibility   Rehab Potential Good   PT Frequency 2x / week   PT Duration 6 weeks   PT Treatment/Interventions ADLs/Self Care Home Management;Neuromuscular re-education;Scar mobilization;Aquatic Therapy;Cryotherapy;Electrical Stimulation;Moist Heat;Balance training;Therapeutic exercise;Therapeutic activities;Functional mobility training;Stair training;Gait training;Patient/family education;Manual techniques;Energy conservation;Passive range of motion   PT Next Visit Plan Progress L knee/quad strength   Consulted and Agree with Plan of Care Patient        Problem List Patient Active Problem List   Diagnosis Date Noted  . Arthrofibrosis of knee joint 03/05/2014  . Hypokalemia  05/30/2013  . OA (osteoarthritis) of knee 05/29/2013  . Postoperative stiffness of total knee replacement 02/15/2013  . Postoperative anemia due to acute blood loss 12/09/2012  . 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

## 2015-02-04 ENCOUNTER — Ambulatory Visit: Payer: Medicare Other | Attending: Orthopedic Surgery | Admitting: Physical Therapy

## 2015-02-04 DIAGNOSIS — M25562 Pain in left knee: Secondary | ICD-10-CM | POA: Insufficient documentation

## 2015-02-04 DIAGNOSIS — M6281 Muscle weakness (generalized): Secondary | ICD-10-CM | POA: Diagnosis present

## 2015-02-04 DIAGNOSIS — M25662 Stiffness of left knee, not elsewhere classified: Secondary | ICD-10-CM | POA: Insufficient documentation

## 2015-02-04 DIAGNOSIS — R262 Difficulty in walking, not elsewhere classified: Secondary | ICD-10-CM | POA: Insufficient documentation

## 2015-02-04 NOTE — Therapy (Signed)
[place] [facility] [address] [place], [place], [address] Phone: [phone]   Fax:  [phone]  Physical Therapy Treatment  Patient Details  Name: [name] [name] MRN: [id number] Date of Birth: [birth year] Referring Provider:  Rozelle Logan, Lavon Paganini, MD  Encounter Date: 02/04/2015      PT End of Session - 02/04/15 1433    Visit Number 8   Number of Visits 12   Date for PT Re-Evaluation 02/21/15   Authorization - Visit Number 8   Authorization - Number of Visits 10   PT Start Time 1019   PT Stop Time 1115   PT Time Calculation (min) 56 min   Activity Tolerance Patient tolerated treatment well;Patient limited by pain   Behavior During Therapy [facility] for tasks assessed/performed      Past Medical History  Diagnosis Date  . Hypertension   . Arthritis   . Vertigo     hx post op  . Complication of anesthesia     terrible vertigo  . PONV (postoperative nausea and vomiting)     related to vertigo  . Shortness of breath     WITH EXERTION    Past Surgical History  Procedure Laterality Date  . Back surgery  2010    cervical fusion  . Foot surgery Left     heel---STATES METAL IN THE HEEL - FOOT TURNS OUTWARD  . Carpal tunnel release Right   . Breast surgery Left     biopsy  . Total knee revision Left 12/07/2012    Procedure:  TOTAL KNEE ARTHROPLASTY REVISION;  Surgeon: Gearlean Alf, MD;  [facility];  Service: Orthopedics;  Laterality: Left;  . Knee closed reduction Left 02/15/2013    Procedure: CLOSED MANIPULATION LEFT KNEE;  Surgeon: Gearlean Alf, MD;  [facility];  Service: Orthopedics;  Laterality: Left;  . Heel spur surgery    . Joint replacement Left 2013    knee  . Knee arthrotomy Left 05/29/2013    Procedure: LEFT KNEE ARTHROTOMY WITH SCAR EXCISION;  Surgeon: Gearlean Alf, MD;  [facility];  Service: Orthopedics;  Laterality: Left;  . Knee arthrotomy Left 03/05/2014    Procedure: LEFT KNEE  ARTHROTOMY/SCAR EXCISION/POLYETHYLENE REVISION;  Surgeon: Gearlean Alf, MD;  [facility];  Service: Orthopedics;  Laterality: Left;    There were no vitals filed for this visit.  Visit Diagnosis:  Joint stiffness of knee, left  Muscle weakness  Difficulty walking  Pain in left knee      Subjective Assessment - 02/04/15 1430    Subjective Pt reports stiffness over the weekend but minimally better today. Pt reports stiffness with sitting for extended amount of time yesterday. Pt denies any pain and reports PT manual for flexion and extension providing relief.    Limitations Walking;Standing;House hold activities   Patient Stated Goals Increase L knee flexion/ ext./ ambulate with more normalized gait with least assistive device.      Currently in Pain? No/denies      OBJECTIVE: There.ex.: Nustep L7 15 min. Seat # 9 (increase flexion) with B UE/LE. Gait around the clinic with verbal cues to increase L knee flexion and L heel strike with SPC. Resisted gait all 4 planes x 5 each (side stepping in a mini squat position). Forward and sideways step overs on 3" step 15 x 2 each direction with B UE support. Partial lunges to encourage L knee flexion over 3" step. Attempted L LE  on the step and stepping up, pt unable (reports L knee feels like buckling with single leg step up). Manual tx.: Contract/relax in sitting to increase L knee flexion. Grade III extension mobilizations to promote increase in L knee extension (with and without blue mobilization belt)- 5x.  Pt response for medical necessity: Pain limited in L distal quad limited by stiffness. Edema noted limiting ROM. Gait with SPC improving but requires verbal cues to increase L knee flexion.         PT Long Term Goals - 01/11/15 1527    PT LONG TERM GOAL #1   Title Pt. I with HEP to increase L knee AROM (-5 to 90 deg.) to improve standing/ functional mobility.     Baseline L knee AROM -12 to 72 deg.    Time 6    Period Weeks   Status New   PT LONG TERM GOAL #2   Title Pt. will increase LEFS to >55 out of 80 to improve pain-free mobility.     Baseline LEFS: 40 out of 80 (01/10/15).     Time 6   Period Weeks   Status New   PT LONG TERM GOAL #3   Title Pt. able to ambulate community distances with no assistive device to improve functional mobility.    Baseline pt. requires use of RW with all walking at this time.    Time 6   Period Weeks   Status New   PT LONG TERM GOAL #4   Title Pt. able to complete household chores on a daily basis with no increase c/o L knee pain or assistive device safely.     Time 6   Period Weeks   Status New            Plan - 02/04/15 1433    Clinical Impression Statement Pitting edema noted in L lower extremity with delayed rebounding. Pt educated on how to decrease fluid. Pt ambulates with SPC and L hip hike. When cued, pt can increase L knee and hip flexion. Pt has difficulty with L knee flexion with step ups, gait and step overs. Pt 's AROM limited secondary to swelling. Pt reports tightness across the patella with flexion and extension exercises.    Pt will benefit from skilled therapeutic intervention in order to improve on the following deficits Abnormal gait;Decreased endurance;Hypomobility;Increased edema;Decreased scar mobility;Decreased activity tolerance;Decreased strength;Increased fascial restricitons;Pain;Difficulty walking;Decreased mobility;Decreased balance;Decreased range of motion;Improper body mechanics;Postural dysfunction;Impaired flexibility   Rehab Potential Good   PT Frequency 2x / week   PT Duration 6 weeks   PT Treatment/Interventions ADLs/Self Care Home Management;Neuromuscular re-education;Scar mobilization;Aquatic Therapy;Cryotherapy;Electrical Stimulation;Moist Heat;Balance training;Therapeutic exercise;Therapeutic activities;Functional mobility training;Stair training;Gait training;Patient/family education;Manual techniques;Energy  conservation;Passive range of motion   PT Next Visit Plan Progress L knee/quad strength.  Update goals next tx.     PT Home Exercise Plan see handouts   Consulted and Agree with Plan of Care Patient        Problem List Patient Active Problem List   Diagnosis Date Noted  . Arthrofibrosis of knee joint 03/05/2014  . Hypokalemia 05/30/2013  . OA (osteoarthritis) of knee 05/29/2013  . Postoperative stiffness of total knee replacement ([place]) 02/15/2013  . Postoperative anemia due to acute blood loss 12/09/2012  . Failed total knee arthroplasty ([place]) 12/07/2012   Pura Spice, PT, DPT # [id number]   02/05/2015, 10:35 AM  [facility] [address] [place], [place], [address] Phone: [phone]   Fax:  [phone]      

## 2015-02-06 ENCOUNTER — Encounter: Payer: Self-pay | Admitting: Physical Therapy

## 2015-02-06 ENCOUNTER — Ambulatory Visit: Payer: Medicare Other | Admitting: Physical Therapy

## 2015-02-06 DIAGNOSIS — M25562 Pain in left knee: Secondary | ICD-10-CM

## 2015-02-06 DIAGNOSIS — M6281 Muscle weakness (generalized): Secondary | ICD-10-CM

## 2015-02-06 DIAGNOSIS — M25662 Stiffness of left knee, not elsewhere classified: Secondary | ICD-10-CM | POA: Diagnosis not present

## 2015-02-06 DIAGNOSIS — R262 Difficulty in walking, not elsewhere classified: Secondary | ICD-10-CM

## 2015-02-06 NOTE — Therapy (Signed)
[facility] [address] [place], [place], [address] Phone: [phone]   Fax:  [phone]  Physical Therapy Treatment  Patient Details  Name: [name] [name] MRN: [id number] Date of Birth: April 14, 1951 Referring Provider:  Rozelle Logan, Lavon Paganini, MD  Encounter Date: 02/06/2015      PT End of Session - 02/06/15 1607    Visit Number 9   Number of Visits 12   Date for PT Re-Evaluation 02/21/15   Authorization - Visit Number 9   Authorization - Number of Visits 10   PT Start Time 1013   PT Stop Time 1115   PT Time Calculation (min) 62 min   Activity Tolerance Patient tolerated treatment well   Behavior During Therapy [facility] for tasks assessed/performed      Past Medical History  Diagnosis Date  . Hypertension   . Arthritis   . Vertigo     hx post op  . Complication of anesthesia     terrible vertigo  . PONV (postoperative nausea and vomiting)     related to vertigo  . Shortness of breath     WITH EXERTION    Past Surgical History  Procedure Laterality Date  . Back surgery  2010    cervical fusion  . Foot surgery Left     heel---STATES METAL IN THE HEEL - FOOT TURNS OUTWARD  . Carpal tunnel release Right   . Breast surgery Left     biopsy  . Total knee revision Left 12/07/2012    Procedure:  TOTAL KNEE ARTHROPLASTY REVISION;  Surgeon: Gearlean Alf, MD;  Location: [facility];  Service: Orthopedics;  Laterality: Left;  . Knee closed reduction Left 02/15/2013    Procedure: CLOSED MANIPULATION LEFT KNEE;  Surgeon: Gearlean Alf, MD;  Location: [facility];  Service: Orthopedics;  Laterality: Left;  . Heel spur surgery    . Joint replacement Left 2013    knee  . Knee arthrotomy Left 05/29/2013    Procedure: LEFT KNEE ARTHROTOMY WITH SCAR EXCISION;  Surgeon: Gearlean Alf, MD;  Location: [facility];  Service: Orthopedics;  Laterality: Left;  . Knee arthrotomy Left 03/05/2014    Procedure: LEFT KNEE ARTHROTOMY/SCAR  EXCISION/POLYETHYLENE REVISION;  Surgeon: Gearlean Alf, MD;  Location: [facility];  Service: Orthopedics;  Laterality: Left;    There were no vitals filed for this visit.  Visit Diagnosis:  Joint stiffness of knee, left  Muscle weakness  Difficulty walking  Pain in left knee      Subjective Assessment - 02/06/15 1606    Subjective Pt reports stiffness in L knee. Pt arrives without SPC. Pt states that she has tried to find a Nustep but unsuccessful with a gym in the area having them.    Limitations Walking;Standing;House hold activities   Patient Stated Goals Increase L knee flexion/ ext./ ambulate with more normalized gait with least assistive device.      Currently in Pain? No/denies       OBJECTIVE: There.ex.: Nustep L7 15 min. Seat # 9 (increase flexion) with B UE/LE. Gait around the clinic with verbal cues to increase L knee flexion and L heel strike without SPC.  Resisted gait all 4 planes x 5 each. Partial lunges to encourage L knee flexion with L knee on 6" step. Step ups on 6" step with L knee. Contract/relax in sitting for L knee flexion (good response with 90 degrees at all points).  Sit to stand from low chairs  7x with no hands (occasional use of B UE/armrests for assist).  Ambulate in clinic/ //-bars without assistive device working on L heel strike/ toe off.   Manual tx.: PROM for knee flexion and extension. STM to L distal quad. Patellar all planes grade III.   Pt response for medical necessity: Pain limited in L distal quad limited by stiffness. Edema noted limiting ROM. Gait with SPC improving but requires verbal cues to increase L knee flexion.         PT Long Term Goals - 02/07/15 0855    PT LONG TERM GOAL #1   Title Pt. I with HEP to increase L knee AROM (-5 to 90 deg.) to improve standing/ functional mobility.     Baseline -10 to 88 deg. L knee AROM after manual stretches.   Time 6   Period Weeks   Status Partially Met   PT LONG TERM GOAL #2    Title Pt. will increase LEFS to >55 out of 80 to improve pain-free mobility.     Baseline LEFS: 40 out of 80 (01/10/15).     Time 6   Period Weeks   Status On-going   PT LONG TERM GOAL #3   Title Pt. able to ambulate community distances with no assistive device to improve functional mobility.    Baseline pt. using SPC primarily but ambulates short distances with no assistive device.   Time 6   Period Weeks   Status Partially Met   PT LONG TERM GOAL #4   Title Pt. able to complete household chores on a daily basis with no increase c/o L knee pain or assistive device safely.     Time 6   Period Weeks   Status Partially Met            Plan - 02/06/15 1607    Clinical Impression Statement Pt L knee flexion AROM in seated progressing to 90 deg with all contract/relax today. Pt still limited in motion secondary to edema. Pt ambulates without SPC and decreased L hip and knee flexion. Pt ambulates with increased L hip hike and circumduction without the cane than with it.    Pt will benefit from skilled therapeutic intervention in order to improve on the following deficits Abnormal gait;Decreased endurance;Hypomobility;Increased edema;Decreased scar mobility;Decreased activity tolerance;Decreased strength;Increased fascial restricitons;Pain;Difficulty walking;Decreased mobility;Decreased balance;Decreased range of motion;Improper body mechanics;Postural dysfunction;Impaired flexibility   Rehab Potential Good   PT Frequency 2x / week   PT Duration 6 weeks   PT Treatment/Interventions ADLs/Self Care Home Management;Neuromuscular re-education;Scar mobilization;Aquatic Therapy;Cryotherapy;Electrical Stimulation;Moist Heat;Balance training;Therapeutic exercise;Therapeutic activities;Functional mobility training;Stair training;Gait training;Patient/family education;Manual techniques;Energy conservation;Passive range of motion   PT Next Visit Plan Progress L knee/quad strength.  Update goals next tx.   G-code update.   PT Home Exercise Plan see handouts   Consulted and Agree with Plan of Care Patient        Problem List Patient Active Problem List   Diagnosis Date Noted  . Arthrofibrosis of knee joint 03/05/2014  . Hypokalemia 05/30/2013  . OA (osteoarthritis) of knee 05/29/2013  . Postoperative stiffness of total knee replacement ([place]) 02/15/2013  . Postoperative anemia due to acute blood loss 12/09/2012  . Failed total knee arthroplasty ([place]) 12/07/2012   Pura Spice, PT, DPT # [id number]   02/07/2015, 9:03 AM  [facility] [address] [place], [place], [address] Phone: [phone]   Fax:  [phone]

## 2015-02-11 ENCOUNTER — Ambulatory Visit: Payer: Medicare Other | Admitting: Physical Therapy

## 2015-02-11 DIAGNOSIS — M25662 Stiffness of left knee, not elsewhere classified: Secondary | ICD-10-CM | POA: Diagnosis not present

## 2015-02-11 DIAGNOSIS — M6281 Muscle weakness (generalized): Secondary | ICD-10-CM

## 2015-02-11 DIAGNOSIS — M25562 Pain in left knee: Secondary | ICD-10-CM

## 2015-02-11 DIAGNOSIS — R262 Difficulty in walking, not elsewhere classified: Secondary | ICD-10-CM

## 2015-02-11 NOTE — Therapy (Signed)
[facility] [address] [place], [place], [address] Phone: [phone]   Fax:  [phone]  Physical Therapy Treatment  Patient Details  Name: [name] [name] MRN: [id number] Date of Birth: [birth year] Referring Provider:  Rozelle Logan, Lavon Paganini, MD  Encounter Date: 02/11/2015      PT End of Session - 02/11/15 1023    Visit Number 10   Number of Visits 24   Date for PT Re-Evaluation 03/25/15   Authorization - Visit Number 10   Authorization - Number of Visits 20   PT Start Time 2263   PT Stop Time 1121   PT Time Calculation (min) 66 min   Activity Tolerance Patient tolerated treatment well   Behavior During Therapy [facility] for tasks assessed/performed      Past Medical History  Diagnosis Date  . Hypertension   . Arthritis   . Vertigo     hx post op  . Complication of anesthesia     terrible vertigo  . PONV (postoperative nausea and vomiting)     related to vertigo  . Shortness of breath     WITH EXERTION    Past Surgical History  Procedure Laterality Date  . Back surgery  2010    cervical fusion  . Foot surgery Left     heel---STATES METAL IN THE HEEL - FOOT TURNS OUTWARD  . Carpal tunnel release Right   . Breast surgery Left     biopsy  . Total knee revision Left 12/07/2012    Procedure:  TOTAL KNEE ARTHROPLASTY REVISION;  Surgeon: Gearlean Alf, MD;  Location: [facility];  Service: Orthopedics;  Laterality: Left;  . Knee closed reduction Left 02/15/2013    Procedure: CLOSED MANIPULATION LEFT KNEE;  Surgeon: Gearlean Alf, MD;  Location: [facility];  Service: Orthopedics;  Laterality: Left;  . Heel spur surgery    . Joint replacement Left 2013    knee  . Knee arthrotomy Left 05/29/2013    Procedure: LEFT KNEE ARTHROTOMY WITH SCAR EXCISION;  Surgeon: Gearlean Alf, MD;  [facility]: [facility];  Service: Orthopedics;  Laterality: Left;  . Knee arthrotomy Left 03/05/2014    Procedure: LEFT KNEE ARTHROTOMY/SCAR  EXCISION/POLYETHYLENE REVISION;  Surgeon: Gearlean Alf, MD;  [facility];  Service: Orthopedics;  Laterality: Left;    There were no vitals filed for this visit.  Visit Diagnosis:  Joint stiffness of knee, left  Muscle weakness  Difficulty walking  Pain in left knee      Subjective Assessment - 02/11/15 1023    Subjective Pt. concerned L knee is getting stiffer and not as straight as last week.  No c/o pain.     Limitations Walking;Standing;House hold activities   Patient Stated Goals Increase L knee flexion/ ext./ ambulate with more normalized gait with least assistive device.      Currently in Pain? No/denies        OBJECTIVE: There.ex.: Nustep L7 15 min. Seat # 9 (warm-up/no charge) with B UE/LE. Gait around the clinic with verbal cues to increase L knee flexion and L heel strike with and without SPC. Sit to stand from low chairs 10x with no hands (occasional use of B UE/armrests for assist). Ambulate in clinic/ //-bars without assistive device working on L heel strike/ toe off. Manual tx.: PROM for knee flexion and extension in supine and sitting position (pain limited). STM to L distal quad. Patellar all planes grade III.   Pt response  for medical necessity: Pain limited in L distal quad during PROM stretches.  Gait with SPC improving but requires verbal cues to increase L knee flexion.         PT Long Term Goals - 02-23-2015 1026    PT LONG TERM GOAL #1   Title Pt. I with HEP to increase L knee AROM (-5 to 90 deg.) to improve standing/ functional mobility.     Baseline -14 to 88 deg.   Time 6   Period Weeks   Status Partially Met   PT LONG TERM GOAL #2   Title Pt. will increase LEFS to >55 out of 80 to improve pain-free mobility.     Baseline LEFS: 40 out of 80 (02-23-2015)   Time 6   Period Weeks   Status Not Met   PT LONG TERM GOAL #3   Title Pt. able to ambulate community distances with no assistive device to improve functional mobility.     Baseline pt. using SPC primarily but ambulates short distances with no assistive device.   Time 6   Period Weeks   Status Partially Met   PT LONG TERM GOAL #4   Title Pt. able to complete household chores on a daily basis with no increase c/o L knee pain or assistive device safely.     Time 6   Period Weeks   Status Partially Met               Plan - February 23, 2015 1222    Clinical Impression Statement Slight regression in L knee AROM (-14 to 88 deg.) after manual tx as compared to last tx. session.  Pain limited L knee extension at distal quadriceps and no c/o hamstring pain during PROM/ patellar mobs. (all planes).  Improved L heel strike after stretching and occasional verbal cuing to avoid toe walking on L.  Able to stand from chair with no UE assist why maintaining L knee flexion safely.     Pt will benefit from skilled therapeutic intervention in order to improve on the following deficits Abnormal gait;Decreased endurance;Hypomobility;Increased edema;Decreased scar mobility;Decreased activity tolerance;Decreased strength;Increased fascial restricitons;Pain;Difficulty walking;Decreased mobility;Decreased balance;Decreased range of motion;Improper body mechanics;Postural dysfunction;Impaired flexibility   Rehab Potential Good   PT Frequency 2x / week   PT Duration 6 weeks   PT Treatment/Interventions ADLs/Self Care Home Management;Neuromuscular re-education;Scar mobilization;Aquatic Therapy;Cryotherapy;Electrical Stimulation;Moist Heat;Balance training;Therapeutic exercise;Therapeutic activities;Functional mobility training;Stair training;Gait training;Patient/family education;Manual techniques;Energy conservation;Passive range of motion   PT Next Visit Plan Progress L knee/quad strength.  Check schedule next tx.     PT Home Exercise Plan see handouts          G-Codes - 23-Feb-2015 1024    Functional Assessment Tool Used LEFS/ clinical judgement/ pain/ gait   Functional Limitation  Mobility: Walking and moving around   Mobility: Walking and Moving Around Current Status [phone]) At least 20 percent but less than 40 percent impaired, limited or restricted   Mobility: Walking and Moving Around Goal Status [phone]) At least 1 percent but less than 20 percent impaired, limited or restricted      Problem List Patient Active Problem List   Diagnosis Date Noted  . Arthrofibrosis of knee joint 03/05/2014  . Hypokalemia 05/30/2013  . OA (osteoarthritis) of knee 05/29/2013  . Postoperative stiffness of total knee replacement ([place]) 02/15/2013  . Postoperative anemia due to acute blood loss 12/09/2012  . 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 [place], [place], [address] Phone: [phone]   Fax:  [phone]

## 2015-02-13 ENCOUNTER — Ambulatory Visit: Payer: Medicare Other | Admitting: Physical Therapy

## 2015-02-13 ENCOUNTER — Encounter: Payer: Self-pay | Admitting: Physical Therapy

## 2015-02-13 DIAGNOSIS — M6281 Muscle weakness (generalized): Secondary | ICD-10-CM

## 2015-02-13 DIAGNOSIS — M25662 Stiffness of left knee, not elsewhere classified: Secondary | ICD-10-CM | POA: Diagnosis not present

## 2015-02-13 DIAGNOSIS — R262 Difficulty in walking, not elsewhere classified: Secondary | ICD-10-CM

## 2015-02-13 DIAGNOSIS — M25562 Pain in left knee: Secondary | ICD-10-CM

## 2015-02-13 NOTE — Therapy (Signed)
[facility] [facility] [address] [place], [place], [address] Phone: [phone]   Fax:  [phone]  Physical Therapy Treatment  Patient Details  Name: [name] [name] MRN: [id number] Date of Birth: Nov 02, 1950 Referring Provider:  Rozelle Logan, Lavon Paganini, MD  Encounter Date: 02/13/2015      PT End of Session - 02/13/15 1331    Visit Number 11   Number of Visits 24   Date for PT Re-Evaluation 03/25/15   Authorization - Visit Number 11   Authorization - Number of Visits 20   PT Start Time 5631   PT Stop Time 1115   PT Time Calculation (min) 52 min   Activity Tolerance Patient tolerated treatment well   Behavior During Therapy [facility] for tasks assessed/performed      Past Medical History  Diagnosis Date  . Hypertension   . Arthritis   . Vertigo     hx post op  . Complication of anesthesia     terrible vertigo  . PONV (postoperative nausea and vomiting)     related to vertigo  . Shortness of breath     WITH EXERTION    Past Surgical History  Procedure Laterality Date  . Back surgery  2010    cervical fusion  . Foot surgery Left     heel---STATES METAL IN THE HEEL - FOOT TURNS OUTWARD  . Carpal tunnel release Right   . Breast surgery Left     biopsy  . Total knee revision Left 12/07/2012    Procedure:  TOTAL KNEE ARTHROPLASTY REVISION;  Surgeon: Gearlean Alf, MD;  [facility];  Service: Orthopedics;  Laterality: Left;  . Knee closed reduction Left 02/15/2013    Procedure: CLOSED MANIPULATION LEFT KNEE;  Surgeon: Gearlean Alf, MD;  [facility]: [facility];  Service: Orthopedics;  Laterality: Left;  . Heel spur surgery    . Joint replacement Left 2013    knee  . Knee arthrotomy Left 05/29/2013    Procedure: LEFT KNEE ARTHROTOMY WITH SCAR EXCISION;  Surgeon: Gearlean Alf, MD;  [facility]: [facility];  Service: Orthopedics;  Laterality: Left;  . Knee arthrotomy Left 03/05/2014    Procedure: LEFT KNEE ARTHROTOMY/SCAR  EXCISION/POLYETHYLENE REVISION;  Surgeon: Gearlean Alf, MD;  [facility];  Service: Orthopedics;  Laterality: Left;    There were no vitals filed for this visit.  Visit Diagnosis:  Joint stiffness of knee, left  Muscle weakness  Difficulty walking  Pain in left knee      Subjective Assessment - 02/13/15 1331    Subjective Pt reports L knee stiffness when waking up and changing positions throughout the day. No c/o pain.   Limitations Walking;Standing;House hold activities   Patient Stated Goals Increase L knee flexion/ ext./ ambulate with more normalized gait with least assistive device.      Currently in Pain? No/denies         OBJECTIVE: Warmup.: Nustep L7 15 min. Seat # 9 (warm-up/no charge) with B UE/LE.Gait training: Gait around the clinic with verbal cues to increase L knee flexion and L heel strike with SPC.Gait on unlevel surfaces including curb and pine straw outside with [facility] and supervision. Manual tx.: PROM for kneeextension in sitting position (pain limited). STM to L distal quad. Knee extension with blue belt stretches for overpressure (pain limited).  Prone knee hang with STM to distal hamstring (pain limited, increased tightness in popliteal fossa). Prone PROM knee flexion to extension focusing on the  transition. Pt response for medical necessity: Pain limited in L distal quad. Gait with SPC improving but requires verbal cues to increase L knee flexion and L heel strike.        PT Long Term Goals - 02/11/15 1026    PT LONG TERM GOAL #1   Title Pt. I with HEP to increase L knee AROM (-5 to 90 deg.) to improve standing/ functional mobility.     Baseline -14 to 88 deg.   Time 6   Period Weeks   Status Partially Met   PT LONG TERM GOAL #2   Title Pt. will increase LEFS to >55 out of 80 to improve pain-free mobility.     Baseline LEFS: 40 out of 80 (02/11/15)   Time 6   Period Weeks   Status Not Met   PT LONG TERM GOAL #3   Title Pt. able  to ambulate community distances with no assistive device to improve functional mobility.    Baseline pt. using SPC primarily but ambulates short distances with no assistive device.   Time 6   Period Weeks   Status Partially Met   PT LONG TERM GOAL #4   Title Pt. able to complete household chores on a daily basis with no increase c/o L knee pain or assistive device safely.     Time 6   Period Weeks   Status Partially Met               Plan - 02/13/15 1332    Clinical Impression Statement Pt ambulates with SPC and decresed L knee flexion. With verbal cues for heel strike, pt is able to break toe walking pattern but continues to have decreased L knee flexion. Pt reports stiffness and tightness behind her L knee. Pt is able to lie in prone but has increased c/o tenderness with distal hamstring soft tissue mobilization. Pt has increased palpable trigger points in L distal quad.    Pt will benefit from skilled therapeutic intervention in order to improve on the following deficits Abnormal gait;Decreased endurance;Hypomobility;Increased edema;Decreased scar mobility;Decreased activity tolerance;Decreased strength;Increased fascial restricitons;Pain;Difficulty walking;Decreased mobility;Decreased balance;Decreased range of motion;Improper body mechanics;Postural dysfunction;Impaired flexibility   Rehab Potential Good   PT Frequency 2x / week   PT Duration 6 weeks   PT Treatment/Interventions ADLs/Self Care Home Management;Neuromuscular re-education;Scar mobilization;Aquatic Therapy;Cryotherapy;Electrical Stimulation;Moist Heat;Balance training;Therapeutic exercise;Therapeutic activities;Functional mobility training;Stair training;Gait training;Patient/family education;Manual techniques;Energy conservation;Passive range of motion   PT Next Visit Plan Progress L knee/quad strength.  Check schedule next tx.     PT Home Exercise Plan see handouts        Problem List Patient Active Problem List    Diagnosis Date Noted  . Arthrofibrosis of knee joint 03/05/2014  . Hypokalemia 05/30/2013  . OA (osteoarthritis) of knee 05/29/2013  . Postoperative stiffness of total knee replacement ([place]) 02/15/2013  . Postoperative anemia due to acute blood loss 12/09/2012  . Failed total knee arthroplasty [facility]) 12/07/2012    Lavone Neri, SPT 02/13/2015, 1:34 PM  [facility] [address] [place], [place], [address] Phone: [phone]   Fax:  [phone]

## 2015-02-18 ENCOUNTER — Encounter: Payer: Self-pay | Admitting: Physical Therapy

## 2015-02-18 ENCOUNTER — Ambulatory Visit: Payer: Medicare Other | Admitting: Physical Therapy

## 2015-02-18 DIAGNOSIS — M25562 Pain in left knee: Secondary | ICD-10-CM

## 2015-02-18 DIAGNOSIS — M25662 Stiffness of left knee, not elsewhere classified: Secondary | ICD-10-CM

## 2015-02-18 DIAGNOSIS — M6281 Muscle weakness (generalized): Secondary | ICD-10-CM

## 2015-02-18 DIAGNOSIS — R262 Difficulty in walking, not elsewhere classified: Secondary | ICD-10-CM

## 2015-02-18 NOTE — Therapy (Signed)
[facility] [address] [place], [place], [address] Phone: [phone]   Fax:  [phone]  Physical Therapy Treatment  Patient Details  Name: [name] [name] MRN: [id number] Date of Birth: 1950-09-03 No Data Recorded  Encounter Date: 02/18/2015      PT End of Session - 02/18/15 1335    Visit Number 12   Number of Visits 24   Date for PT Re-Evaluation 03/25/15   Authorization - Visit Number 12   Authorization - Number of Visits 20   PT Start Time 1020   PT Stop Time 1115   PT Time Calculation (min) 55 min   Activity Tolerance Patient tolerated treatment well   Behavior During Therapy [facility] for tasks assessed/performed      Past Medical History  Diagnosis Date  . Hypertension   . Arthritis   . Vertigo     hx post op  . Complication of anesthesia     terrible vertigo  . PONV (postoperative nausea and vomiting)     related to vertigo  . Shortness of breath     WITH EXERTION    Past Surgical History  Procedure Laterality Date  . Back surgery  2010    cervical fusion  . Foot surgery Left     heel---STATES METAL IN THE HEEL - FOOT TURNS OUTWARD  . Carpal tunnel release Right   . Breast surgery Left     biopsy  . Total knee revision Left 12/07/2012    Procedure:  TOTAL KNEE ARTHROPLASTY REVISION;  Surgeon: Gearlean Alf, MD;  Location: [facility];  Service: Orthopedics;  Laterality: Left;  . Knee closed reduction Left 02/15/2013    Procedure: CLOSED MANIPULATION LEFT KNEE;  Surgeon: Gearlean Alf, MD;  [facility]: [facility];  Service: Orthopedics;  Laterality: Left;  . Heel spur surgery    . Joint replacement Left 2013    knee  . Knee arthrotomy Left 05/29/2013    Procedure: LEFT KNEE ARTHROTOMY WITH SCAR EXCISION;  Surgeon: Gearlean Alf, MD;  [facility]: [facility];  Service: Orthopedics;  Laterality: Left;  . Knee arthrotomy Left 03/05/2014    Procedure: LEFT KNEE ARTHROTOMY/SCAR EXCISION/POLYETHYLENE REVISION;  Surgeon:  Gearlean Alf, MD;  [facility];  Service: Orthopedics;  Laterality: Left;    There were no vitals filed for this visit.  Visit Diagnosis:  Joint stiffness of knee, left  Muscle weakness  Pain in left knee  Difficulty walking      Subjective Assessment - 02/18/15 1334    Subjective Pt reports stiffness in L knee that has lasted all of the weekend. Pt arrives with [facility] and is ambulating with a toe off gait pattern. Pt reports R hip pain with ambualtion.    Limitations Walking;Standing;House hold activities   Patient Stated Goals Increase L knee flexion/ ext./ ambulate with more normalized gait with least assistive device.      Currently in Pain? Yes   Pain Score 2    Pain Location Hip   Pain Orientation Right   Pain Descriptors / Indicators Aching         OBJECTIVE:  L knee AROM: -26  To 81 deg in supine L knee PROM:  -21 to 84 deg in supine Warmup.: Nustep L7 15 min. Seat # 9 (no charge) with B UE/LE.Gait training: Gait around the clinic with verbal cues to increase L knee flexion and L heel strike with SPC.Gait on unlevel surfaces with SPC and increased  cuing for L heel strike. Pt unable to correct R sided leaning posture with verbal cuing. Pt L LE advancement coming from the L hip. Manual tx.: PROM for knee extension in sitting position. Knee extension with blue belt stretches for overpressure (pain limited).Supine fibular head mobilizations grade III 3 x 20 seconds. Grade III mobilizations for increased knee extension in supine 4 x 30 seconds.  Pt response for medical necessity: Gait limited by R hip pain and decreased L heel strike. Pt limited by stiffness with L knee extension which is limiting functional mobility.         PT Long Term Goals - 02/11/15 1026    PT LONG TERM GOAL #1   Title Pt. I with HEP to increase L knee AROM (-5 to 90 deg.) to improve standing/ functional mobility.     Baseline -14 to 88 deg.   Time 6   Period Weeks   Status  Partially Met   PT LONG TERM GOAL #2   Title Pt. will increase LEFS to >55 out of 80 to improve pain-free mobility.     Baseline LEFS: 40 out of 80 (02/11/15)   Time 6   Period Weeks   Status Not Met   PT LONG TERM GOAL #3   Title Pt. able to ambulate community distances with no assistive device to improve functional mobility.    Baseline pt. using SPC primarily but ambulates short distances with no assistive device.   Time 6   Period Weeks   Status Partially Met   PT LONG TERM GOAL #4   Title Pt. able to complete household chores on a daily basis with no increase c/o L knee pain or assistive device safely.     Time 6   Period Weeks   Status Partially Met               Plan - 02/18/15 1335    Clinical Impression Statement Pt ambulates with increased L knee stiffness with all motion from the L hip. Pt arrives ambualting with toe walking gait. With verbal cuing, pt is able to correct R sided lean and increase L heel strike. Pt is unable to maintain heel strike without verbal cuing. Increased stiffness observed in pt's L knee AROM: -26 to 81 deg in supine. L knee PROM: -21 to 84 deg in supine.    Pt will benefit from skilled therapeutic intervention in order to improve on the following deficits Abnormal gait;Decreased endurance;Hypomobility;Increased edema;Decreased scar mobility;Decreased activity tolerance;Decreased strength;Increased fascial restricitons;Pain;Difficulty walking;Decreased mobility;Decreased balance;Decreased range of motion;Improper body mechanics;Postural dysfunction;Impaired flexibility   Rehab Potential Good   PT Frequency 2x / week   PT Duration 6 weeks   PT Treatment/Interventions ADLs/Self Care Home Management;Neuromuscular re-education;Scar mobilization;Aquatic Therapy;Cryotherapy;Electrical Stimulation;Moist Heat;Balance training;Therapeutic exercise;Therapeutic activities;Functional mobility training;Stair training;Gait training;Patient/family  education;Manual techniques;Energy conservation;Passive range of motion   PT Next Visit Plan L quad strengthening/extension/TKE/increase HEP   Recommended Other Services silver sneakers   Consulted and Agree with Plan of Care Patient        Problem List Patient Active Problem List   Diagnosis Date Noted  . Arthrofibrosis of knee joint 03/05/2014  . Hypokalemia 05/30/2013  . OA (osteoarthritis) of knee 05/29/2013  . Postoperative stiffness of total knee replacement ([place]) 02/15/2013  . Postoperative anemia due to acute blood loss 12/09/2012  . 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 [place], [place], [address] Phone: [phone]   Fax:  [phone]  Name: BURLENE MONTECALVO MRN: [id number] Date of Birth: April 27, 1951

## 2015-02-20 ENCOUNTER — Ambulatory Visit: Payer: Medicare Other | Admitting: Physical Therapy

## 2015-02-20 DIAGNOSIS — M25562 Pain in left knee: Secondary | ICD-10-CM

## 2015-02-20 DIAGNOSIS — M25662 Stiffness of left knee, not elsewhere classified: Secondary | ICD-10-CM

## 2015-02-20 DIAGNOSIS — M6281 Muscle weakness (generalized): Secondary | ICD-10-CM

## 2015-02-20 DIAGNOSIS — R262 Difficulty in walking, not elsewhere classified: Secondary | ICD-10-CM

## 2015-02-20 NOTE — Therapy (Signed)
[facility] [address] [place], [place], [address] Phone: [phone]   Fax:  [phone]  Physical Therapy Treatment  Patient Details  Name: [name] [name] MRN: [id number] Date of Birth: 06/08/1950 No Data Recorded  Encounter Date: 02/20/2015      PT End of Session - 02/20/15 1043    Visit Number 13   Number of Visits 24   Date for PT Re-Evaluation 03/25/15   Authorization - Visit Number 13   Authorization - Number of Visits 20   PT Start Time 6294   PT Stop Time 1135   PT Time Calculation (min) 57 min   Activity Tolerance Patient tolerated treatment well;Patient limited by pain   Behavior During Therapy [facility] for tasks assessed/performed      Past Medical History  Diagnosis Date  . Hypertension   . Arthritis   . Vertigo     hx post op  . Complication of anesthesia     terrible vertigo  . PONV (postoperative nausea and vomiting)     related to vertigo  . Shortness of breath     WITH EXERTION    Past Surgical History  Procedure Laterality Date  . Back surgery  2010    cervical fusion  . Foot surgery Left     heel---STATES METAL IN THE HEEL - FOOT TURNS OUTWARD  . Carpal tunnel release Right   . Breast surgery Left     biopsy  . Total knee revision Left 12/07/2012    Procedure:  TOTAL KNEE ARTHROPLASTY REVISION;  Surgeon: Gearlean Alf, MD;  [facility];  Service: Orthopedics;  Laterality: Left;  . Knee closed reduction Left 02/15/2013    Procedure: CLOSED MANIPULATION LEFT KNEE;  Surgeon: Gearlean Alf, MD;  [facility];  Service: Orthopedics;  Laterality: Left;  . Heel spur surgery    . Joint replacement Left 2013    knee  . Knee arthrotomy Left 05/29/2013    Procedure: LEFT KNEE ARTHROTOMY WITH SCAR EXCISION;  Surgeon: Gearlean Alf, MD;  [facility];  Service: Orthopedics;  Laterality: Left;  . Knee arthrotomy Left 03/05/2014    Procedure: LEFT KNEE ARTHROTOMY/SCAR  EXCISION/POLYETHYLENE REVISION;  Surgeon: Gearlean Alf, MD;  [facility];  Service: Orthopedics;  Laterality: Left;    There were no vitals filed for this visit.  Visit Diagnosis:  Joint stiffness of knee, left  Muscle weakness  Pain in left knee  Difficulty walking      Subjective Assessment - 02/20/15 1041    Subjective Pt. reports no pain just "tightness, stiffness" in L knee joint.     Limitations Walking;Standing;House hold activities   Patient Stated Goals Increase L knee flexion/ ext./ ambulate with more normalized gait with least assistive device.      Currently in Pain? No/denies        OBJECTIVE:  Circumferential: L/R knee: joint line: 44/39 cm., mid-gastroc: 35.5/34 cm., distal quad: 46/41 cm.  L knee AROM: -16 To 81 deg in supine L knee PROM: -10 to 88 deg in supine Warmup.: Nustep L7 15 min. Seat # 9 (no charge/warm-up) with B UE/LE. Gait training: Gait around the clinic with verbal cues to increase L knee flexion and L heel strike with SPC. Pt has difficulty correcting R sided leaning/upright posture with verbal cuing.  There.ex.: supine/seated L knee AROM (flexion/ext.) 10x.  Reviewed HEP.  Manual tx.: PROM for knee extension in supine/sitting position. Knee extension with  blue belt stretches for overpressure (pain limited).Supine fibular head mobilizations grade III 3 x 20 seconds. Grade III mobilizations for increased knee extension in supine 4 x 30 seconds. Patellar mobs./ scar tissue to L knee in supine position.   Pt response for medical necessity: Marked increase in L knee extension after manual tx. Today as compared to last tx. Session.  Pt. Focused on heel strike with gait and continues to benefit from use of SPC and occasional cuing.         PT Long Term Goals - 02/11/15 1026    PT LONG TERM GOAL #1   Title Pt. I with HEP to increase L knee AROM (-5 to 90 deg.) to improve standing/ functional mobility.     Baseline -14 to 88  deg.   Time 6   Period Weeks   Status Partially Met   PT LONG TERM GOAL #2   Title Pt. will increase LEFS to >55 out of 80 to improve pain-free mobility.     Baseline LEFS: 40 out of 80 (02/11/15)   Time 6   Period Weeks   Status Not Met   PT LONG TERM GOAL #3   Title Pt. able to ambulate community distances with no assistive device to improve functional mobility.    Baseline pt. using SPC primarily but ambulates short distances with no assistive device.   Time 6   Period Weeks   Status Partially Met   PT LONG TERM GOAL #4   Title Pt. able to complete household chores on a daily basis with no increase c/o L knee pain or assistive device safely.     Time 6   Period Weeks   Status Partially Met            Plan - 02/20/15 1247    Clinical Impression Statement Pt. ambulating with consistent heel strike with use of SPC in clinic today but remains with flexed posture/ rounded shoulders.  Increased stiffness observed in pt's L knee AROM: -16 to 81 deg in supine. L knee PROM: -10 to 88 deg in supine (pain limited).  PT discussed use of immobilizer at night to maintain/ improve L knee extension.  Pt. remains very motivated and hardworking during PT tx./ HEP/ daily activity.     Pt will benefit from skilled therapeutic intervention in order to improve on the following deficits Abnormal gait;Decreased endurance;Hypomobility;Increased edema;Decreased scar mobility;Decreased activity tolerance;Decreased strength;Increased fascial restricitons;Pain;Difficulty walking;Decreased mobility;Decreased balance;Decreased range of motion;Improper body mechanics;Postural dysfunction;Impaired flexibility   Rehab Potential Good   PT Frequency 2x / week   PT Duration 6 weeks   PT Treatment/Interventions ADLs/Self Care Home Management;Neuromuscular re-education;Scar mobilization;Aquatic Therapy;Cryotherapy;Electrical Stimulation;Moist Heat;Balance training;Therapeutic exercise;Therapeutic activities;Functional  mobility training;Stair training;Gait training;Patient/family education;Manual techniques;Energy conservation;Passive range of motion   PT Next Visit Plan L quad strengthening/extension/TKE/increase HEP   PT Home Exercise Plan see handouts   Consulted and Agree with Plan of Care Patient        Problem List Patient Active Problem List   Diagnosis Date Noted  . Arthrofibrosis of knee joint 03/05/2014  . Hypokalemia 05/30/2013  . OA (osteoarthritis) of knee 05/29/2013  . Postoperative stiffness of total knee replacement ([place]) 02/15/2013  . Postoperative anemia due to acute blood loss 12/09/2012  . 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 [place], [place], [address] Phone: [phone]   Fax:  [phone]  Name: [name] [name] MRN: [id number] Date of Birth: 1951/03/08

## 2015-02-25 ENCOUNTER — Encounter: Payer: Self-pay | Admitting: Physical Therapy

## 2015-02-25 ENCOUNTER — Ambulatory Visit: Payer: Medicare Other | Admitting: Physical Therapy

## 2015-02-25 DIAGNOSIS — R262 Difficulty in walking, not elsewhere classified: Secondary | ICD-10-CM

## 2015-02-25 DIAGNOSIS — M25562 Pain in left knee: Secondary | ICD-10-CM

## 2015-02-25 DIAGNOSIS — M25662 Stiffness of left knee, not elsewhere classified: Secondary | ICD-10-CM | POA: Diagnosis not present

## 2015-02-25 DIAGNOSIS — M6281 Muscle weakness (generalized): Secondary | ICD-10-CM

## 2015-02-25 NOTE — Therapy (Signed)
[facility] [address] [place], [place], [address] Phone: [phone]   Fax:  [phone]  Physical Therapy Treatment  Patient Details  Name: [name] [name] MRN: [id number] Date of Birth: May 26, 1950 No Data Recorded  Encounter Date: 02/25/2015      PT End of Session - 02/25/15 1746    Visit Number 14   Number of Visits 24   Date for PT Re-Evaluation 03/25/15   Authorization - Visit Number 14   Authorization - Number of Visits 20   PT Start Time 3976   PT Stop Time 1121   PT Time Calculation (min) 66 min   Activity Tolerance Patient tolerated treatment well;Patient limited by pain   Behavior During Therapy [facility] for tasks assessed/performed      Past Medical History  Diagnosis Date  . Hypertension   . Arthritis   . Vertigo     hx post op  . Complication of anesthesia     terrible vertigo  . PONV (postoperative nausea and vomiting)     related to vertigo  . Shortness of breath     WITH EXERTION    Past Surgical History  Procedure Laterality Date  . Back surgery  2010    cervical fusion  . Foot surgery Left     heel---STATES METAL IN THE HEEL - FOOT TURNS OUTWARD  . Carpal tunnel release Right   . Breast surgery Left     biopsy  . Total knee revision Left 12/07/2012    Procedure:  TOTAL KNEE ARTHROPLASTY REVISION;  Surgeon: Gearlean Alf, MD;  [facility];  Service: Orthopedics;  Laterality: Left;  . Knee closed reduction Left 02/15/2013    Procedure: CLOSED MANIPULATION LEFT KNEE;  Surgeon: Gearlean Alf, MD;  [facility];  Service: Orthopedics;  Laterality: Left;  . Heel spur surgery    . Joint replacement Left 2013    knee  . Knee arthrotomy Left 05/29/2013    Procedure: LEFT KNEE ARTHROTOMY WITH SCAR EXCISION;  Surgeon: Gearlean Alf, MD;  [facility];  Service: Orthopedics;  Laterality: Left;  . Knee arthrotomy Left 03/05/2014    Procedure: LEFT KNEE ARTHROTOMY/SCAR  EXCISION/POLYETHYLENE REVISION;  Surgeon: Gearlean Alf, MD;  [facility];  Service: Orthopedics;  Laterality: Left;    There were no vitals filed for this visit.  Visit Diagnosis:  Joint stiffness of knee, left  Muscle weakness  Pain in left knee  Difficulty walking      Subjective Assessment - 02/25/15 1742    Subjective Pt. states MD appt. went okay and was issued knee extension (adjustable) brace to wear during day/night.  MD wants 6 more weeks of PT to focus on extension and prevent any regression in knee ROM.     Limitations Walking;Standing;House hold activities   Patient Stated Goals Increase L knee flexion/ ext./ ambulate with more normalized gait with least assistive device.      Currently in Pain? No/denies        OBJECTIVE:   Warmup.: Nustep L7 15 min. Seat # 9 (no charge/warm-up) with B UE/LE. There.ex.: supine/seated L knee AROM (flexion/ext.) 10x.  Resisted gait 10x all 4-planes with min. To no UE assist (cuing for upright posture)- mirror feedback.  Walking in clinic with instruction/ posture correction.  Manual tx.: PROM for knee extension in supine/sitting position. Knee extension with blue belt stretches for overpressure (pain limited).Supine fibular head mobilizations grade III 3 x 20 seconds.  Grade III mobilizations for increased knee extension in supine 4 x 30 seconds. Patellar mobs./ scar tissue to L knee in supine position.   Pt response for medical necessity: L knee extension remains pain limited after manual tx.  Decrease swelling in L knee today with increase medial/lateral patellar mobility.          PT Long Term Goals - 02/11/15 1026    PT LONG TERM GOAL #1   Title Pt. I with HEP to increase L knee AROM (-5 to 90 deg.) to improve standing/ functional mobility.     Baseline -14 to 88 deg.   Time 6   Period Weeks   Status Partially Met   PT LONG TERM GOAL #2   Title Pt. will increase LEFS to >55 out of 80 to improve  pain-free mobility.     Baseline LEFS: 40 out of 80 (02/11/15)   Time 6   Period Weeks   Status Not Met   PT LONG TERM GOAL #3   Title Pt. able to ambulate community distances with no assistive device to improve functional mobility.    Baseline pt. using SPC primarily but ambulates short distances with no assistive device.   Time 6   Period Weeks   Status Partially Met   PT LONG TERM GOAL #4   Title Pt. able to complete household chores on a daily basis with no increase c/o L knee pain or assistive device safely.     Time 6   Period Weeks   Status Partially Met               Plan - 02/25/15 1749    Clinical Impression Statement Pt. reports increase L knee stiffness after manual tx. but increase ROM noted.  Pain and limited L knee joint end-feel remain primary factors hindering overall progress.  Pt. able to ambulate with no assistive device and use of SPC with consistent heel strike.  PT recommends use of knee brace with <10 deg. flexion at night and return back ro 30 deg. flexion with walking to improve overall mobility/ gait pattern.       Pt will benefit from skilled therapeutic intervention in order to improve on the following deficits Abnormal gait;Decreased endurance;Hypomobility;Increased edema;Decreased scar mobility;Decreased activity tolerance;Decreased strength;Increased fascial restricitons;Pain;Difficulty walking;Decreased mobility;Decreased balance;Decreased range of motion;Improper body mechanics;Postural dysfunction;Impaired flexibility   Rehab Potential Good   PT Frequency 2x / week   PT Duration 6 weeks   PT Treatment/Interventions ADLs/Self Care Home Management;Neuromuscular re-education;Scar mobilization;Aquatic Therapy;Cryotherapy;Electrical Stimulation;Moist Heat;Balance training;Therapeutic exercise;Therapeutic activities;Functional mobility training;Stair training;Gait training;Patient/family education;Manual techniques;Energy conservation;Passive range of  motion   PT Next Visit Plan L quad strengthening/extension/TKE/increase HEP   PT Home Exercise Plan see handouts   Consulted and Agree with Plan of Care Patient        Problem List Patient Active Problem List   Diagnosis Date Noted  . Arthrofibrosis of knee joint 03/05/2014  . Hypokalemia 05/30/2013  . OA (osteoarthritis) of knee 05/29/2013  . Postoperative stiffness of total knee replacement ([place]) 02/15/2013  . Postoperative anemia due to acute blood loss 12/09/2012  . 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 [place], [place], [address] Phone: [phone]   Fax:  [phone]  Name: [name] [name] MRN: [id number] Date of Birth: 03/10/1951

## 2015-02-27 ENCOUNTER — Ambulatory Visit: Payer: Medicare Other | Admitting: Physical Therapy

## 2015-02-27 DIAGNOSIS — R262 Difficulty in walking, not elsewhere classified: Secondary | ICD-10-CM

## 2015-02-27 DIAGNOSIS — M25562 Pain in left knee: Secondary | ICD-10-CM

## 2015-02-27 DIAGNOSIS — M6281 Muscle weakness (generalized): Secondary | ICD-10-CM

## 2015-02-27 DIAGNOSIS — M25662 Stiffness of left knee, not elsewhere classified: Secondary | ICD-10-CM | POA: Diagnosis not present

## 2015-02-27 NOTE — Therapy (Deleted)
[facility] [address] [place], [place], [address] Phone: [phone]   Fax:  [phone]  Physical Therapy Treatment  Patient Details  Name: [name] [name] MRN: [id number] Date of Birth: 02-Oct-1950 No Data Recorded  Encounter Date: 02/27/2015    Past Medical History  Diagnosis Date  . Hypertension   . Arthritis   . Vertigo     hx post op  . Complication of anesthesia     terrible vertigo  . PONV (postoperative nausea and vomiting)     related to vertigo  . Shortness of breath     WITH EXERTION    Past Surgical History  Procedure Laterality Date  . Back surgery  2010    cervical fusion  . Foot surgery Left     heel---STATES METAL IN THE HEEL - FOOT TURNS OUTWARD  . Carpal tunnel release Right   . Breast surgery Left     biopsy  . Total knee revision Left 12/07/2012    Procedure:  TOTAL KNEE ARTHROPLASTY REVISION;  Surgeon: Gearlean Alf, MD;  [facility];  Service: Orthopedics;  Laterality: Left;  . Knee closed reduction Left 02/15/2013    Procedure: CLOSED MANIPULATION LEFT KNEE;  Surgeon: Gearlean Alf, MD;  [facility];  Service: Orthopedics;  Laterality: Left;  . Heel spur surgery    . Joint replacement Left 2013    knee  . Knee arthrotomy Left 05/29/2013    Procedure: LEFT KNEE ARTHROTOMY WITH SCAR EXCISION;  Surgeon: Gearlean Alf, MD;  [facility];  Service: Orthopedics;  Laterality: Left;  . Knee arthrotomy Left 03/05/2014    Procedure: LEFT KNEE ARTHROTOMY/SCAR EXCISION/POLYETHYLENE REVISION;  Surgeon: Gearlean Alf, MD;  [facility];  Service: Orthopedics;  Laterality: Left;    There were no vitals filed for this visit.  Visit Diagnosis:  No diagnosis found.                Nustep. TG/ walking/ mat table manual stretches/  Sit to stands.                      PT Long Term Goals - 02/11/15 1026    PT LONG TERM GOAL #1   Title Pt. I with HEP to  increase L knee AROM (-5 to 90 deg.) to improve standing/ functional mobility.     Baseline -14 to 88 deg.   Time 6   Period Weeks   Status Partially Met   PT LONG TERM GOAL #2   Title Pt. will increase LEFS to >55 out of 80 to improve pain-free mobility.     Baseline LEFS: 40 out of 80 (02/11/15)   Time 6   Period Weeks   Status Not Met   PT LONG TERM GOAL #3   Title Pt. able to ambulate community distances with no assistive device to improve functional mobility.    Baseline pt. using SPC primarily but ambulates short distances with no assistive device.   Time 6   Period Weeks   Status Partially Met   PT LONG TERM GOAL #4   Title Pt. able to complete household chores on a daily basis with no increase c/o L knee pain or assistive device safely.     Time 6   Period Weeks   Status Partially Met               Problem List Patient Active Problem List  Diagnosis Date Noted  . Arthrofibrosis of knee joint 03/05/2014  . Hypokalemia 05/30/2013  . OA (osteoarthritis) of knee 05/29/2013  . Postoperative stiffness of total knee replacement ([place]) 02/15/2013  . Postoperative anemia due to acute blood loss 12/09/2012  . 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 [place], [place], [address] Phone: [phone]   Fax:  [phone]  Name: ANNIYAH MOOD MRN: [id number] Date of Birth: 1950-07-26

## 2015-03-04 ENCOUNTER — Encounter: Payer: Self-pay | Admitting: Physical Therapy

## 2015-03-04 ENCOUNTER — Ambulatory Visit: Payer: Medicare Other | Admitting: Physical Therapy

## 2015-03-04 DIAGNOSIS — M25662 Stiffness of left knee, not elsewhere classified: Secondary | ICD-10-CM

## 2015-03-04 DIAGNOSIS — M6281 Muscle weakness (generalized): Secondary | ICD-10-CM

## 2015-03-04 DIAGNOSIS — R262 Difficulty in walking, not elsewhere classified: Secondary | ICD-10-CM

## 2015-03-04 DIAGNOSIS — M25562 Pain in left knee: Secondary | ICD-10-CM

## 2015-03-04 NOTE — Therapy (Signed)
[facility] [address] [place], [place], [address] Phone: [phone]   Fax:  [phone]  Physical Therapy Treatment  Patient Details  Name: [name] [name] MRN: [id number] Date of Birth: [birth year] No Data Recorded  Encounter Date: 03/04/2015      PT End of Session - 03/04/15 1122    Visit Number 16   Number of Visits 24   Date for PT Re-Evaluation 03/25/15   Authorization - Visit Number 16   Authorization - Number of Visits 20   PT Start Time 1030   PT Stop Time 1120   PT Time Calculation (min) 50 min   Activity Tolerance Patient tolerated treatment well;Patient limited by pain   Behavior During Therapy [facility] for tasks assessed/performed      Past Medical History  Diagnosis Date  . Hypertension   . Arthritis   . Vertigo     hx post op  . Complication of anesthesia     terrible vertigo  . PONV (postoperative nausea and vomiting)     related to vertigo  . Shortness of breath     WITH EXERTION    Past Surgical History  Procedure Laterality Date  . Back surgery  2010    cervical fusion  . Foot surgery Left     heel---STATES METAL IN THE HEEL - FOOT TURNS OUTWARD  . Carpal tunnel release Right   . Breast surgery Left     biopsy  . Total knee revision Left 12/07/2012    Procedure:  TOTAL KNEE ARTHROPLASTY REVISION;  Surgeon: Gearlean Alf, MD;  Location: [facility];  Service: Orthopedics;  Laterality: Left;  . Knee closed reduction Left 02/15/2013    Procedure: CLOSED MANIPULATION LEFT KNEE;  Surgeon: Gearlean Alf, MD;  Location: [facility];  Service: Orthopedics;  Laterality: Left;  . Heel spur surgery    . Joint replacement Left 2013    knee  . Knee arthrotomy Left 05/29/2013    Procedure: LEFT KNEE ARTHROTOMY WITH SCAR EXCISION;  Surgeon: Gearlean Alf, MD;  [facility];  Service: Orthopedics;  Laterality: Left;  . Knee arthrotomy Left 03/05/2014    Procedure: LEFT KNEE ARTHROTOMY/SCAR  EXCISION/POLYETHYLENE REVISION;  Surgeon: Gearlean Alf, MD;  [facility];  Service: Orthopedics;  Laterality: Left;    There were no vitals filed for this visit.  Visit Diagnosis:  Joint stiffness of knee, left  Muscle weakness  Pain in left knee  Difficulty walking      Subjective Assessment - 03/04/15 1121    Subjective Pt reports no new complaints. Pt reports stiffness and swelling that has increased as the day goes on.    Limitations Walking;Standing;House hold activities   Patient Stated Goals Increase L knee flexion/ ext./ ambulate with more normalized gait with least assistive device.      Currently in Pain? No/denies      OBJECTIVE: Warm up: 15 mins on Nustep L7. There ex: 3" step downs with R foot for L quad eccentric control x 20 with B UE support. L TKE in standing with green theraband in available range x 20 (pt unable to reach full knee extension so starting knee flexion increased to challenge her hamstrings). Gait with increased heel strike around the clinic with Dane. In supine, 10 quad sets with ankle elevated on towel roll (imbalance VMO and VLO noted; decreased VMO muscle bulk and activation noted). Manual: PROM for increased knee extension limited by pain. STM to  L distal quad with lateral tightness noted. No complaints of ITB syndrome or tenderness with palpation.   Pt response to Tx for medical necessity: Pt benefits from PROM and strengthening in her available active range. Pt ambulates with a functional gait pattern with inconsistent heel strike and decreased toe off.         PT Long Term Goals - 02/11/15 1026    PT LONG TERM GOAL #1   Title Pt. I with HEP to increase L knee AROM (-5 to 90 deg.) to improve standing/ functional mobility.     Baseline -14 to 88 deg.   Time 6   Period Weeks   Status Partially Met   PT LONG TERM GOAL #2   Title Pt. will increase LEFS to >55 out of 80 to improve pain-free mobility.     Baseline LEFS: 40 out of 80  (02/11/15)   Time 6   Period Weeks   Status Not Met   PT LONG TERM GOAL #3   Title Pt. able to ambulate community distances with no assistive device to improve functional mobility.    Baseline pt. using SPC primarily but ambulates short distances with no assistive device.   Time 6   Period Weeks   Status Partially Met   PT LONG TERM GOAL #4   Title Pt. able to complete household chores on a daily basis with no increase c/o L knee pain or assistive device safely.     Time 6   Period Weeks   Status Partially Met            Plan - 03/04/15 1123    Clinical Impression Statement L knee extension is limited in standing and supine by complaints of increased anterior knee tightness. Pt is lacking 20 deg of L knee extension in supine AROM, and passively lacks 8 degrees of extension. Pt has palpable tightness and tenderness in L distal hamstring and quadriceps. Pitting edema is present in L LE and pt is educated to continue with ice and elevation on her L LE. Pt compliant with HEP and extension brace wearing at night.    Pt will benefit from skilled therapeutic intervention in order to improve on the following deficits Abnormal gait;Decreased endurance;Hypomobility;Increased edema;Decreased scar mobility;Decreased activity tolerance;Decreased strength;Increased fascial restricitons;Pain;Difficulty walking;Decreased mobility;Decreased balance;Decreased range of motion;Improper body mechanics;Postural dysfunction;Impaired flexibility   Rehab Potential Good   PT Frequency 2x / week   PT Duration 6 weeks   PT Treatment/Interventions ADLs/Self Care Home Management;Neuromuscular re-education;Scar mobilization;Aquatic Therapy;Cryotherapy;Electrical Stimulation;Moist Heat;Balance training;Therapeutic exercise;Therapeutic activities;Functional mobility training;Stair training;Gait training;Patient/family education;Manual techniques;Energy conservation;Passive range of motion   PT Next Visit Plan L quad  strengthening in standing: squats, step downs, step ups/L hamstring flexibility/gait with consistent heel strike and toe off.   PT Home Exercise Plan continue current progression   Consulted and Agree with Plan of Care Patient        Problem List Patient Active Problem List   Diagnosis Date Noted  . Arthrofibrosis of knee joint 03/05/2014  . Hypokalemia 05/30/2013  . OA (osteoarthritis) of knee 05/29/2013  . Postoperative stiffness of total knee replacement ([place]) 02/15/2013  . Postoperative anemia due to acute blood loss 12/09/2012  . 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 [place], [place], [address] Phone: [phone]   Fax:  [phone]  Name: [name] [name] MRN: [id number] Date  of Birth: 01-27-1951

## 2015-03-04 NOTE — Therapy (Addendum)
[facility] [address] [place], [place], [address] Phone: [phone]   Fax:  [phone]  Physical Therapy Treatment  Patient Details  Name: [name] [name] MRN: [id number] Date of Birth: [birth year] No Data Recorded  Encounter Date: 02/27/2015    Past Medical History  Diagnosis Date  . Hypertension   . Arthritis   . Vertigo     hx post op  . Complication of anesthesia     terrible vertigo  . PONV (postoperative nausea and vomiting)     related to vertigo  . Shortness of breath     WITH EXERTION    Past Surgical History  Procedure Laterality Date  . Back surgery  2010    cervical fusion  . Foot surgery Left     heel---STATES METAL IN THE HEEL - FOOT TURNS OUTWARD  . Carpal tunnel release Right   . Breast surgery Left     biopsy  . Total knee revision Left 12/07/2012    Procedure:  TOTAL KNEE ARTHROPLASTY REVISION;  Surgeon: Gearlean Alf, MD;  [facility];  Service: Orthopedics;  Laterality: Left;  . Knee closed reduction Left 02/15/2013    Procedure: CLOSED MANIPULATION LEFT KNEE;  Surgeon: Gearlean Alf, MD;  [facility];  Service: Orthopedics;  Laterality: Left;  . Heel spur surgery    . Joint replacement Left 2013    knee  . Knee arthrotomy Left 05/29/2013    Procedure: LEFT KNEE ARTHROTOMY WITH SCAR EXCISION;  Surgeon: Gearlean Alf, MD;  [facility];  Service: Orthopedics;  Laterality: Left;  . Knee arthrotomy Left 03/05/2014    Procedure: LEFT KNEE ARTHROTOMY/SCAR EXCISION/POLYETHYLENE REVISION;  Surgeon: Gearlean Alf, MD;  [facility];  Service: Orthopedics;  Laterality: Left;    There were no vitals filed for this visit.  Visit Diagnosis:  Joint stiffness of knee, left  Muscle weakness  Pain in left knee  Difficulty walking     OBJECTIVE:   Warmup.: Nustep L7 15 min. Seat # 9 (no charge/warm-up) with B UE/LE. There.ex.: supine/seated L knee AROM (flexion/ext.)  10x. Resisted gait 10x all 4-planes with min. To no UE assist (cuing for upright posture)- mirror feedback.  TG knee flexion 20x with static holds in flexion position. Walking in clinic with instruction/ posture correction.  Manual tx.: PROM for knee extension in supine/sitting position. Knee extension with blue belt stretches for overpressure (pain limited).Supine fibular head mobilizations grade III 3 x 20 seconds. Grade III mobilizations for increased knee extension in supine 4 x 30 seconds. Patellar mobs./ scar tissue to L knee in supine position.   Pt response for medical necessity: L knee extension remains pain limited after manual tx. Decrease swelling in L knee today with increase medial/lateral patellar mobility.          PT Long Term Goals - 02/11/15 1026    PT LONG TERM GOAL #1   Title Pt. I with HEP to increase L knee AROM (-5 to 90 deg.) to improve standing/ functional mobility.     Baseline -14 to 88 deg.   Time 6   Period Weeks   Status Partially Met   PT LONG TERM GOAL #2   Title Pt. will increase LEFS to >55 out of 80 to improve pain-free mobility.     Baseline LEFS: 40 out of 80 (02/11/15)   Time 6   Period Weeks   Status Not Met   PT LONG  TERM GOAL #3   Title Pt. able to ambulate community distances with no assistive device to improve functional mobility.    Baseline pt. using SPC primarily but ambulates short distances with no assistive device.   Time 6   Period Weeks   Status Partially Met   PT LONG TERM GOAL #4   Title Pt. able to complete household chores on a daily basis with no increase c/o L knee pain or assistive device safely.     Time 6   Period Weeks   Status Partially Met       L knee stiffness remains and aggressive stretching for knee extension is pain limited with tight end-feel.  L knee extension PROM -8 deg. after aggressive manual stretching/ ROM ex.  Pt. c/o significant pain during stretching but pain improves quickly after  completion of manual tx.      Problem List Patient Active Problem List   Diagnosis Date Noted  . Arthrofibrosis of knee joint 03/05/2014  . Hypokalemia 05/30/2013  . OA (osteoarthritis) of knee 05/29/2013  . Postoperative stiffness of total knee replacement ([place]) 02/15/2013  . Postoperative anemia due to acute blood loss 12/09/2012  . 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 [place], [place], [address] Phone: [phone]   Fax:  [phone]  Name: [name] [name] MRN: [id number] Date of Birth: 1951-02-14

## 2015-03-04 NOTE — Therapy (Deleted)
[facility] [address] [place], [place], [address] Phone: [phone]   Fax:  [phone]  Physical Therapy Treatment  Patient Details  Name: [name] [name] MRN: [id number] Date of Birth: 1950/07/25 No Data Recorded  Encounter Date: 02/27/2015    Past Medical History  Diagnosis Date  . Hypertension   . Arthritis   . Vertigo     hx post op  . Complication of anesthesia     terrible vertigo  . PONV (postoperative nausea and vomiting)     related to vertigo  . Shortness of breath     WITH EXERTION    Past Surgical History  Procedure Laterality Date  . Back surgery  2010    cervical fusion  . Foot surgery Left     heel---STATES METAL IN THE HEEL - FOOT TURNS OUTWARD  . Carpal tunnel release Right   . Breast surgery Left     biopsy  . Total knee revision Left 12/07/2012    Procedure:  TOTAL KNEE ARTHROPLASTY REVISION;  Surgeon: Gearlean Alf, MD;  [facility];  Service: Orthopedics;  Laterality: Left;  . Knee closed reduction Left 02/15/2013    Procedure: CLOSED MANIPULATION LEFT KNEE;  Surgeon: Gearlean Alf, MD;  [facility];  Service: Orthopedics;  Laterality: Left;  . Heel spur surgery    . Joint replacement Left 2013    knee  . Knee arthrotomy Left 05/29/2013    Procedure: LEFT KNEE ARTHROTOMY WITH SCAR EXCISION;  Surgeon: Gearlean Alf, MD;  [facility];  Service: Orthopedics;  Laterality: Left;  . Knee arthrotomy Left 03/05/2014    Procedure: LEFT KNEE ARTHROTOMY/SCAR EXCISION/POLYETHYLENE REVISION;  Surgeon: Gearlean Alf, MD;  [facility];  Service: Orthopedics;  Laterality: Left;    There were no vitals filed for this visit.  Visit Diagnosis:  Joint stiffness of knee, left  Muscle weakness  Pain in left knee  Difficulty walking                                    PT Long Term Goals - 02/11/15 1026    PT LONG TERM GOAL #1   Title Pt. I with  HEP to increase L knee AROM (-5 to 90 deg.) to improve standing/ functional mobility.     Baseline -14 to 88 deg.   Time 6   Period Weeks   Status Partially Met   PT LONG TERM GOAL #2   Title Pt. will increase LEFS to >55 out of 80 to improve pain-free mobility.     Baseline LEFS: 40 out of 80 (02/11/15)   Time 6   Period Weeks   Status Not Met   PT LONG TERM GOAL #3   Title Pt. able to ambulate community distances with no assistive device to improve functional mobility.    Baseline pt. using SPC primarily but ambulates short distances with no assistive device.   Time 6   Period Weeks   Status Partially Met   PT LONG TERM GOAL #4   Title Pt. able to complete household chores on a daily basis with no increase c/o L knee pain or assistive device safely.     Time 6   Period Weeks   Status Partially Met               Problem List Patient Active Problem List  Diagnosis Date Noted  . Arthrofibrosis of knee joint 03/05/2014  . Hypokalemia 05/30/2013  . OA (osteoarthritis) of knee 05/29/2013  . Postoperative stiffness of total knee replacement ([place]) 02/15/2013  . Postoperative anemia due to acute blood loss 12/09/2012  . 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 [place], [place], [address] Phone: [phone]   Fax:  [phone]  Name: [name] [name] MRN: [id number] Date of Birth: Jun 05, 1950

## 2015-03-06 ENCOUNTER — Ambulatory Visit: Payer: Medicare Other | Attending: Orthopedic Surgery | Admitting: Physical Therapy

## 2015-03-06 DIAGNOSIS — M25662 Stiffness of left knee, not elsewhere classified: Secondary | ICD-10-CM | POA: Diagnosis present

## 2015-03-06 DIAGNOSIS — M6281 Muscle weakness (generalized): Secondary | ICD-10-CM | POA: Insufficient documentation

## 2015-03-06 DIAGNOSIS — R262 Difficulty in walking, not elsewhere classified: Secondary | ICD-10-CM | POA: Diagnosis present

## 2015-03-06 DIAGNOSIS — M25562 Pain in left knee: Secondary | ICD-10-CM | POA: Insufficient documentation

## 2015-03-07 ENCOUNTER — Encounter: Payer: Self-pay | Admitting: Physical Therapy

## 2015-03-07 NOTE — Therapy (Signed)
[facility] [address] [place], [place], [address] Phone: [phone]   Fax:  [phone]  Physical Therapy Treatment  Patient Details  Name: [name] [name] MRN: [id number] Date of Birth: [birth year] No Data Recorded  Encounter Date: 03/06/2015      PT End of Session - 03/07/15 0746    Visit Number 17   Number of Visits 24   Date for PT Re-Evaluation 03/25/15   Authorization - Visit Number 95   Authorization - Number of Visits 20   PT Start Time 1054   PT Stop Time 1135   PT Time Calculation (min) 41 min   Activity Tolerance Patient tolerated treatment well;Patient limited by pain   Behavior During Therapy [facility] for tasks assessed/performed      Past Medical History  Diagnosis Date  . Hypertension   . Arthritis   . Vertigo     hx post op  . Complication of anesthesia     terrible vertigo  . PONV (postoperative nausea and vomiting)     related to vertigo  . Shortness of breath     WITH EXERTION    Past Surgical History  Procedure Laterality Date  . Back surgery  2010    cervical fusion  . Foot surgery Left     heel---STATES METAL IN THE HEEL - FOOT TURNS OUTWARD  . Carpal tunnel release Right   . Breast surgery Left     biopsy  . Total knee revision Left 12/07/2012    Procedure:  TOTAL KNEE ARTHROPLASTY REVISION;  Surgeon: Gearlean Alf, MD;  [facility];  Service: Orthopedics;  Laterality: Left;  . Knee closed reduction Left 02/15/2013    Procedure: CLOSED MANIPULATION LEFT KNEE;  Surgeon: Gearlean Alf, MD;  [facility];  Service: Orthopedics;  Laterality: Left;  . Heel spur surgery    . Joint replacement Left 2013    knee  . Knee arthrotomy Left 05/29/2013    Procedure: LEFT KNEE ARTHROTOMY WITH SCAR EXCISION;  Surgeon: Gearlean Alf, MD;  [facility];  Service: Orthopedics;  Laterality: Left;  . Knee arthrotomy Left 03/05/2014    Procedure: LEFT KNEE ARTHROTOMY/SCAR  EXCISION/POLYETHYLENE REVISION;  Surgeon: Gearlean Alf, MD;  [facility];  Service: Orthopedics;  Laterality: Left;    There were no vitals filed for this visit.  Visit Diagnosis:  Joint stiffness of knee, left  Muscle weakness  Pain in left knee  Difficulty walking      Subjective Assessment - 03/07/15 0744    Subjective Pt reports no new complaints. Pt reports stiffness and swelling during the day. Pt reports feeling stiff and pain with PROM but reports it not carrying over once stretching is done.   Limitations Walking;Standing;House hold activities   Patient Stated Goals Increase L knee flexion/ ext./ ambulate with more normalized gait with least assistive device.      Currently in Pain? No/denies        OBJECTIVE: Manual: In long sitting, PROM and stretching for extension with mobilization belt, holds for 30 seconds x 5 times. Increased pain noted at anterior knee with PROM. Patellar mobilizations in all 4 planes 4 x 20 seconds. Tightness noted with inferior and superior patellar mobilization. No complaints of tightness or tenderness with distal hamstring or quad STM. In prone, rectus and psoas stretching 3 x 30 seconds with complaints of tightness in anterior knee. Distal hamstring STM. There ex: Gait around the clinic with  cuing for increased toe off and heel strike with SPC. Noted that pt ambulates with decreased hip extension. Steps downs on 3" step for eccentric L quad control. Good control difficulty with pushing up without UE support.  Pt response to Tx for medical necessity: Pt benefits from quad strengthening to decrease quad lag and increase L knee extension. Pt benefits from gait training to demonstrate a functional gait pattern.       PT Long Term Goals - 02/11/15 1026    PT LONG TERM GOAL #1   Title Pt. I with HEP to increase L knee AROM (-5 to 90 deg.) to improve standing/ functional mobility.     Baseline -14 to 88 deg.   Time 6   Period Weeks   Status  Partially Met   PT LONG TERM GOAL #2   Title Pt. will increase LEFS to >55 out of 80 to improve pain-free mobility.     Baseline LEFS: 40 out of 80 (02/11/15)   Time 6   Period Weeks   Status Not Met   PT LONG TERM GOAL #3   Title Pt. able to ambulate community distances with no assistive device to improve functional mobility.    Baseline pt. using SPC primarily but ambulates short distances with no assistive device.   Time 6   Period Weeks   Status Partially Met   PT LONG TERM GOAL #4   Title Pt. able to complete household chores on a daily basis with no increase c/o L knee pain or assistive device safely.     Time 6   Period Weeks   Status Partially Met            Plan - 03/07/15 0748    Clinical Impression Statement Pt reports L anterior knee pain and tightness with all stretching. Pt is limited in extension by quad lag and tightness. Pitting edema is still present with delayed rebound; 2 on Pitting edema scale. Pt educated on use of compression stockings to help with edema. Pt lacks 22 degress of L knee extension in supine with AROM, and passively 9 degrees in supine. Pt's AROM L knee flexion is 78 deg in supine, 84 degrees in supine AAROM. Pt is complaint with HEP and using brace locked at 10 degrees at night and 30 deg during the day.   Pt will benefit from skilled therapeutic intervention in order to improve on the following deficits Abnormal gait;Decreased endurance;Hypomobility;Increased edema;Decreased scar mobility;Decreased activity tolerance;Decreased strength;Increased fascial restricitons;Pain;Difficulty walking;Decreased mobility;Decreased balance;Decreased range of motion;Improper body mechanics;Postural dysfunction;Impaired flexibility   Rehab Potential Good   PT Frequency 2x / week   PT Duration 6 weeks   PT Treatment/Interventions ADLs/Self Care Home Management;Neuromuscular re-education;Scar mobilization;Aquatic Therapy;Cryotherapy;Electrical Stimulation;Moist  Heat;Balance training;Therapeutic exercise;Therapeutic activities;Functional mobility training;Stair training;Gait training;Patient/family education;Manual techniques;Energy conservation;Passive range of motion   PT Next Visit Plan L quad strengthening in standing: squats, step downs, step ups/L hamstring flexibility/gait with consistent heel strike and toe off.Continue to work on eccentric quad strengthening    PT [place] continue current progression   Consulted and Agree with Plan of Care Patient        Problem List Patient Active Problem List   Diagnosis Date Noted  . Arthrofibrosis of knee joint 03/05/2014  . Hypokalemia 05/30/2013  . OA (osteoarthritis) of knee 05/29/2013  . Postoperative stiffness of total knee replacement ([place]) 02/15/2013  . Postoperative anemia due to acute blood loss 12/09/2012  . 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 [place], [place], [address] Phone: [phone]   Fax:  [phone]  Name: [name] [name] MRN: [id number] Date of Birth: Feb 01, 1951

## 2015-03-11 ENCOUNTER — Encounter: Payer: Self-pay | Admitting: Physical Therapy

## 2015-03-11 ENCOUNTER — Ambulatory Visit: Payer: Medicare Other | Admitting: Physical Therapy

## 2015-03-11 DIAGNOSIS — M25662 Stiffness of left knee, not elsewhere classified: Secondary | ICD-10-CM | POA: Diagnosis not present

## 2015-03-11 DIAGNOSIS — M6281 Muscle weakness (generalized): Secondary | ICD-10-CM

## 2015-03-11 DIAGNOSIS — M25562 Pain in left knee: Secondary | ICD-10-CM

## 2015-03-11 DIAGNOSIS — R262 Difficulty in walking, not elsewhere classified: Secondary | ICD-10-CM

## 2015-03-11 NOTE — Therapy (Signed)
[facility] [address] [place], [place], [address] Phone: [phone]   Fax:  [phone]  Physical Therapy Treatment  Patient Details  Name: [name] [name] MRN: [id number] Date of Birth: [birth year] No Data Recorded  Encounter Date: 03/11/2015      PT End of Session - 03/11/15 1304    Visit Number 18   Number of Visits 24   Date for PT Re-Evaluation 03/25/15   Authorization - Visit Number 18   Authorization - Number of Visits 20   PT Start Time 0539   PT Stop Time 1120   PT Time Calculation (min) 42 min   Activity Tolerance Patient tolerated treatment well;Patient limited by pain   Behavior During Therapy [facility] for tasks assessed/performed      Past Medical History  Diagnosis Date  . Hypertension   . Arthritis   . Vertigo     hx post op  . Complication of anesthesia     terrible vertigo  . PONV (postoperative nausea and vomiting)     related to vertigo  . Shortness of breath     WITH EXERTION    Past Surgical History  Procedure Laterality Date  . Back surgery  2010    cervical fusion  . Foot surgery Left     heel---STATES METAL IN THE HEEL - FOOT TURNS OUTWARD  . Carpal tunnel release Right   . Breast surgery Left     biopsy  . Total knee revision Left 12/07/2012    Procedure:  TOTAL KNEE ARTHROPLASTY REVISION;  Surgeon: Gearlean Alf, MD;  Location: [facility];  Service: Orthopedics;  Laterality: Left;  . Knee closed reduction Left 02/15/2013    Procedure: CLOSED MANIPULATION LEFT KNEE;  Surgeon: Gearlean Alf, MD;  Location: [facility];  Service: Orthopedics;  Laterality: Left;  . Heel spur surgery    . Joint replacement Left 2013    knee  . Knee arthrotomy Left 05/29/2013    Procedure: LEFT KNEE ARTHROTOMY WITH SCAR EXCISION;  Surgeon: Gearlean Alf, MD;  Location: [facility];  Service: Orthopedics;  Laterality: Left;  . Knee arthrotomy Left 03/05/2014    Procedure: LEFT KNEE ARTHROTOMY/SCAR  EXCISION/POLYETHYLENE REVISION;  Surgeon: Gearlean Alf, MD;  [facility];  Service: Orthopedics;  Laterality: Left;    There were no vitals filed for this visit.  Visit Diagnosis:  Joint stiffness of knee, left  Muscle weakness  Pain in left knee  Difficulty walking      Subjective Assessment - 03/11/15 1303    Subjective Pt reports L knee tightness and stiffness. Pt reports no new problems over the weekend and states that she had a low key weekend.   Limitations Walking;Standing;House hold activities   Patient Stated Goals Increase L knee flexion/ ext./ ambulate with more normalized gait with least assistive device.      Currently in Pain? No/denies      OBJECTIVE: There ex: 15 mins NuStep L7 (no charge). Contract/relax in prone to facilitate hamstring stretching and strengthening. Pt able to tolerate with moderate L hip flexion compensations, 10 reps with 5 second holds. Manual: Prone STM to L distal hamstring with 5# ankle weight around L ankle. Pt able to tolerate STM without complaints of pain or increased symptoms but still feels tightness in her anterior L knee. Assistance with STM to maintain neutral L LE alignment and maximize hamstring stretching. Patellar grade III mobilizations in inferior/superior directions 6 x 20 seconds each.  Superior mobilizations in long sitting with PROM stretch to hamstrings. Inferior mobilizations done in sitting with knee flexion to match proper tracking.   Pt response to tx for medical necessity: Pt benefits from [facility] and strengthening to decrease L knee flexion with ambulation. Pt benefits from [facility] for gait and has no c/o increased symptoms with PT.      PT Long Term Goals - 02/11/15 1026    PT LONG TERM GOAL #1   Title Pt. I with HEP to increase L knee AROM (-5 to 90 deg.) to improve standing/ functional mobility.     Baseline -14 to 88 deg.   Time 6   Period Weeks   Status Partially Met   PT LONG TERM GOAL #2   Title Pt. will  increase LEFS to >55 out of 80 to improve pain-free mobility.     Baseline LEFS: 40 out of 80 (02/11/15)   Time 6   Period Weeks   Status Not Met   PT LONG TERM GOAL #3   Title Pt. able to ambulate community distances with no assistive device to improve functional mobility.    Baseline pt. using SPC primarily but ambulates short distances with no assistive device.   Time 6   Period Weeks   Status Partially Met   PT LONG TERM GOAL #4   Title Pt. able to complete household chores on a daily basis with no increase c/o L knee pain or assistive device safely.     Time 6   Period Weeks   Status Partially Met               Plan - 03/11/15 1305    Clinical Impression Statement Pt ambulates with increased L knee flexion as compared to previous session. Pt reports tightness at anterior knee cap with motion. Pt compensates for discomfort by flexing her L hip and externally rotating her L LE. In prone, pt reports no distal hamstring discomfort with STM although increased tightness as compared to the R is noted. Pt has patellar mobility but is stiff for initial mobilizations in the inferior and superior direction.  Pt is unable to take pressure with contract relax to acheive straight leg in prone.    Pt will benefit from skilled therapeutic intervention in order to improve on the following deficits Abnormal gait;Decreased endurance;Hypomobility;Increased edema;Decreased scar mobility;Decreased activity tolerance;Decreased strength;Increased fascial restricitons;Pain;Difficulty walking;Decreased mobility;Decreased balance;Decreased range of motion;Improper body mechanics;Postural dysfunction;Impaired flexibility   Rehab Potential Good   PT Frequency 2x / week   PT Duration 6 weeks   PT Treatment/Interventions ADLs/Self Care Home Management;Neuromuscular re-education;Scar mobilization;Aquatic Therapy;Cryotherapy;Electrical Stimulation;Moist Heat;Balance training;Therapeutic exercise;Therapeutic  activities;Functional mobility training;Stair training;Gait training;Patient/family education;Manual techniques;Energy conservation;Passive range of motion   PT Next Visit Plan L quad strengthening in standing: squats, step downs, step ups/L hamstring flexibility/gait with consistent heel strike and toe off.Continue to work on eccentric quad strengthening/STM to decrease HS tightness/HS strengthening.   PT Home Exercise Plan continue current progression   Consulted and Agree with Plan of Care Patient        Problem List Patient Active Problem List   Diagnosis Date Noted  . Arthrofibrosis of knee joint 03/05/2014  . Hypokalemia 05/30/2013  . OA (osteoarthritis) of knee 05/29/2013  . Postoperative stiffness of total knee replacement ([place]) 02/15/2013  . Postoperative anemia due to acute blood loss 12/09/2012  . 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 [place], [place], [address] Phone: [phone]   Fax:  [phone]  Name: [name] [name] MRN: [id number] Date of Birth: Sep 13, 1950

## 2015-03-13 ENCOUNTER — Ambulatory Visit: Payer: Medicare Other | Admitting: Physical Therapy

## 2015-03-13 ENCOUNTER — Encounter: Payer: Self-pay | Admitting: Physical Therapy

## 2015-03-13 DIAGNOSIS — M25562 Pain in left knee: Secondary | ICD-10-CM

## 2015-03-13 DIAGNOSIS — R262 Difficulty in walking, not elsewhere classified: Secondary | ICD-10-CM

## 2015-03-13 DIAGNOSIS — M25662 Stiffness of left knee, not elsewhere classified: Secondary | ICD-10-CM

## 2015-03-13 DIAGNOSIS — M6281 Muscle weakness (generalized): Secondary | ICD-10-CM

## 2015-03-13 NOTE — Therapy (Addendum)
[place] [facility] [address] [place], [place], [address] Phone: [phone]   Fax:  [phone]  Physical Therapy Treatment  Patient Details  Name: [name] [name] MRN: [id number] Date of Birth: 06/08/1950 No Data Recorded  Encounter Date: 03/13/2015      PT End of Session - 03/13/15 1403    Visit Number 19   Number of Visits 24   Date for PT Re-Evaluation 03/25/15   Authorization - Visit Number 19   Authorization - Number of Visits 20   PT Start Time 1022   PT Stop Time 1120   PT Time Calculation (min) 58 min   Activity Tolerance Patient tolerated treatment well;Patient limited by pain   Behavior During Therapy [facility] for tasks assessed/performed      Past Medical History  Diagnosis Date  . Hypertension   . Arthritis   . Vertigo     hx post op  . Complication of anesthesia     terrible vertigo  . PONV (postoperative nausea and vomiting)     related to vertigo  . Shortness of breath     WITH EXERTION    Past Surgical History  Procedure Laterality Date  . Back surgery  2010    cervical fusion  . Foot surgery Left     heel---STATES METAL IN THE HEEL - FOOT TURNS OUTWARD  . Carpal tunnel release Right   . Breast surgery Left     biopsy  . Total knee revision Left 12/07/2012    Procedure:  TOTAL KNEE ARTHROPLASTY REVISION;  Surgeon: Frank V Aluisio, MD;  [facility];  Service: Orthopedics;  Laterality: Left;  . Knee closed reduction Left 02/15/2013    Procedure: CLOSED MANIPULATION LEFT KNEE;  Surgeon: Frank V Aluisio, MD;  [facility];  Service: Orthopedics;  Laterality: Left;  . Heel spur surgery    . Joint replacement Left 2013    knee  . Knee arthrotomy Left 05/29/2013    Procedure: LEFT KNEE ARTHROTOMY WITH SCAR EXCISION;  Surgeon: Frank V Aluisio, MD;  [facility];  Service: Orthopedics;  Laterality: Left;  . Knee arthrotomy Left 03/05/2014    Procedure: LEFT KNEE ARTHROTOMY/SCAR  EXCISION/POLYETHYLENE REVISION;  Surgeon: Frank Aluisio V, MD;  [facility];  Service: Orthopedics;  Laterality: Left;    There were no vitals filed for this visit.  Visit Diagnosis:  Pain in left knee  Joint stiffness of knee, left  Muscle weakness  Difficulty walking      Subjective Assessment - 03/13/15 1403    Subjective Pt reports stiffness in anterior knee and patella area upon arrival to PT tx session. Pt denies any pain or new complaints.    Limitations Walking;Standing;House hold activities   Patient Stated Goals Increase L knee flexion/ ext./ ambulate with more normalized gait with least assistive device.      Currently in Pain? No/denies        OBJECTIVE:  There ex: Nustep L7 Seat 11 to promote extension 12 mins (no charge).  Manual tx.: Contract relax in seated for flexion 5 second holds x 10. Contract relax in prone for extension 5 second holds x 10.  Pt c/o pain with hold but is relieved when leg is relaxed.  Supine R knee PROM ext. With holds as tolerated 5x (pain limited).  Reviewed HEP/ lunges.  Gait training: Increased cues for heel strike needed with gait training in //-bars/ around PT clinic with use of SPC for safety.    Mirror feedback/ posture correction with moderate cuing.   Medical necessity:  Pain limited knee ext with 8 deg. Lacking in supine position PROM.  Pt. Required moderate cuing to correct gait pattern/ heel strike/ posture.           PT Long Term Goals - 02/11/15 1026    PT LONG TERM GOAL #1   Title Pt. I with HEP to increase L knee AROM (-5 to 90 deg.) to improve standing/ functional mobility.     Baseline -14 to 88 deg.   Time 6   Period Weeks   Status Partially Met   PT LONG TERM GOAL #2   Title Pt. will increase LEFS to >55 out of 80 to improve pain-free mobility.     Baseline LEFS: 40 out of 80 (02/11/15)   Time 6   Period Weeks   Status Not Met   PT LONG TERM GOAL #3   Title Pt. able to ambulate community distances with no  assistive device to improve functional mobility.    Baseline pt. using SPC primarily but ambulates short distances with no assistive device.   Time 6   Period Weeks   Status Partially Met   PT LONG TERM GOAL #4   Title Pt. able to complete household chores on a daily basis with no increase c/o L knee pain or assistive device safely.     Time 6   Period Weeks   Status Partially Met       Problem List Patient Active Problem List   Diagnosis Date Noted  . Arthrofibrosis of knee joint 03/05/2014  . Hypokalemia 05/30/2013  . OA (osteoarthritis) of knee 05/29/2013  . Postoperative stiffness of total knee replacement ([place]) 02/15/2013  . Postoperative anemia due to acute blood loss 12/09/2012  . 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 [place], [place], [address] Phone: [phone]   Fax:  [phone]  Name: [name] [name] MRN: [id number] Date of Birth: 1950/08/17

## 2015-03-18 ENCOUNTER — Encounter: Payer: Self-pay | Admitting: Physical Therapy

## 2015-03-18 ENCOUNTER — Ambulatory Visit: Payer: Medicare Other | Admitting: Physical Therapy

## 2015-03-18 DIAGNOSIS — M25662 Stiffness of left knee, not elsewhere classified: Secondary | ICD-10-CM

## 2015-03-18 DIAGNOSIS — M25562 Pain in left knee: Secondary | ICD-10-CM

## 2015-03-18 DIAGNOSIS — R262 Difficulty in walking, not elsewhere classified: Secondary | ICD-10-CM

## 2015-03-18 DIAGNOSIS — M6281 Muscle weakness (generalized): Secondary | ICD-10-CM

## 2015-03-18 NOTE — Therapy (Signed)
[place] [facility] [address] [place], [place], [address] Phone: [phone]   Fax:  [phone]  Physical Therapy Treatment  Patient Details  Name: [name] [name] MRN: [id number] Date of Birth: 08-02-1950 No Data Recorded  Encounter Date: 03/18/2015      PT End of Session - 03/18/15 1250    Visit Number 20   Number of Visits 24   Date for PT Re-Evaluation 03/25/15   Authorization - Visit Number 42   Authorization - Number of Visits 24   PT Start Time 1020   PT Stop Time 1115   PT Time Calculation (min) 55 min   Equipment Utilized During Treatment Other (comment)  L knee hinge brace set at 30 deg flexion and extension   Activity Tolerance Patient tolerated treatment well;Patient limited by pain   Behavior During Therapy [facility] for tasks assessed/performed      Past Medical History  Diagnosis Date  . Hypertension   . Arthritis   . Vertigo     hx post op  . Complication of anesthesia     terrible vertigo  . PONV (postoperative nausea and vomiting)     related to vertigo  . Shortness of breath     WITH EXERTION    Past Surgical History  Procedure Laterality Date  . Back surgery  2010    cervical fusion  . Foot surgery Left     heel---STATES METAL IN THE HEEL - FOOT TURNS OUTWARD  . Carpal tunnel release Right   . Breast surgery Left     biopsy  . Total knee revision Left 12/07/2012    Procedure:  TOTAL KNEE ARTHROPLASTY REVISION;  Surgeon: Gearlean Alf, MD;  [facility];  Service: Orthopedics;  Laterality: Left;  . Knee closed reduction Left 02/15/2013    Procedure: CLOSED MANIPULATION LEFT KNEE;  Surgeon: Gearlean Alf, MD;  [facility]: [facility];  Service: Orthopedics;  Laterality: Left;  . Heel spur surgery    . Joint replacement Left 2013    knee  . Knee arthrotomy Left 05/29/2013    Procedure: LEFT KNEE ARTHROTOMY WITH SCAR EXCISION;  Surgeon: Gearlean Alf, MD;  [facility];  Service: Orthopedics;   Laterality: Left;  . Knee arthrotomy Left 03/05/2014    Procedure: LEFT KNEE ARTHROTOMY/SCAR EXCISION/POLYETHYLENE REVISION;  Surgeon: Gearlean Alf, MD;  [facility];  Service: Orthopedics;  Laterality: Left;    There were no vitals filed for this visit.  Visit Diagnosis:  Pain in left knee  Joint stiffness of knee, left  Muscle weakness  Difficulty walking      Subjective Assessment - 03/18/15 1248    Subjective Pt reports no new complaints today. Pt sates she moved furniture around over the weekend and had to take rest breaks throughout due to R hip pain.   Limitations Walking;Standing;House hold activities   Patient Stated Goals Increase L knee flexion/ ext./ ambulate with more normalized gait with least assistive device.      Currently in Pain? No/denies      OBJECTIVE: There ex: NuStep L7 15 mins (no charge). 6" step downs for eccentric L quad control x 15. Poor form noted and unable to correct with verbal cues. Pt unable to push up without use of UE. Resisted side walking with 50# on [place] 5 each direction. Side stepping to the R pt had difficulty maintaining stance on L LE and had one LOB that was corrected with min A  of PT. Gait training: Gait with hinge L knee brace locked at 30 degrees for flexion and extension with cues for heel strike and no R hip hike. Pt able to correct but unable to maintain normalized gait pattern. Pt ambulates with antalgic gait and increased R hip hike around gym. Pt ambulated 41' with no AD but unequal step pattern and increased R hip hike. Manual therapy: STM to L gastroc and hamstring. Tenderness noted with gastroc palpation. Patella mobilizations grade III all 4 planes 4 x 30 seconds. Stretching of L hamstring and gastroc. Subtalar AP mobilizations grade III for increased DF 3 x 30 seconds.   Pt response for medical necessity: Pt benefits from manual therapy to decrease tightness and tenderness to palpation. Pt benefits from manual,  strengthening and the use of her knee brace to improve gait pattern with SPC.      PT Long Term Goals - 03/18/15 1327    PT LONG TERM GOAL #1   Title Pt. I with HEP to increase L knee AROM (-5 to 90 deg.) to improve standing/ functional mobility.     Time 6   Period Weeks   Status On-going   PT LONG TERM GOAL #2   Title Pt. will increase LEFS to >55 out of 80 to improve pain-free mobility.     Baseline LEFS: 40 out of 80 (02/11/15); LEFS: 24/80 on 03/18/15   Time 6   Period Weeks   Status Not Met   PT LONG TERM GOAL #3   Title Pt. able to ambulate community distances with no assistive device to improve functional mobility.    Baseline pt. using SPC primarily but ambulates short distances with no assistive device.   Time 6   Period Weeks   Status Not Met   PT LONG TERM GOAL #4   Title Pt. able to complete household chores on a daily basis with no increase c/o L knee pain or assistive device safely.     Time 6   Period Weeks   Status On-going               Plan - 03/18/15 1319    Clinical Impression Statement Pt is limited with gait with proper heel strike while ambulating in her knee brace locked at 30 degrees of flexion and extension. Pt reports knee feeling tight with ambulation in the brace. As pt fatigues, pt demonstrates an antalgic gait pattern with increased R hip hike with L circumduction during swing. Pt demonstrates decreased eccentric L quad control with 6" step downs. Pt is able to maintain upright posture with side stepping and 50# resistance but has diffficulty with stance on L LE for extended time. Palpable tenderness noted to L gastroc. Good mobility with grade III subtablar mobilizations noted.    Pt will benefit from skilled therapeutic intervention in order to improve on the following deficits Abnormal gait;Decreased endurance;Hypomobility;Increased edema;Decreased scar mobility;Decreased activity tolerance;Decreased strength;Increased fascial  restricitons;Pain;Difficulty walking;Decreased mobility;Decreased balance;Decreased range of motion;Improper body mechanics;Postural dysfunction;Impaired flexibility   Rehab Potential Good   PT Frequency 2x / week   PT Duration 6 weeks   PT Treatment/Interventions ADLs/Self Care Home Management;Neuromuscular re-education;Scar mobilization;Aquatic Therapy;Cryotherapy;Electrical Stimulation;Moist Heat;Balance training;Therapeutic exercise;Therapeutic activities;Functional mobility training;Stair training;Gait training;Patient/family education;Manual techniques;Energy conservation;Passive range of motion   PT Next Visit Plan Gastroc stretching/increase DF with subtalar mobs/gait with SPC over grass/total gym/stairs/lunges   PT Home Exercise Plan continue current progression   Recommended Other Services wellness program   Consulted and Agree with Plan of  Care Patient        Problem List Patient Active Problem List   Diagnosis Date Noted  . Arthrofibrosis of knee joint 03/05/2014  . Hypokalemia 05/30/2013  . OA (osteoarthritis) of knee 05/29/2013  . Postoperative stiffness of total knee replacement ([place]) 02/15/2013  . Postoperative anemia due to acute blood loss 12/09/2012  . 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 [place], [place], [address] Phone: [phone]   Fax:  [phone]  Name: [name] [name] MRN: [id number] Date of Birth: December 02, 1950

## 2015-03-18 NOTE — Addendum Note (Signed)
Addended by: Dorcas Carrow C on: 03/18/2015 01:45 PM   Modules accepted: Orders

## 2015-03-20 ENCOUNTER — Ambulatory Visit: Payer: Medicare Other | Admitting: Physical Therapy

## 2015-03-20 DIAGNOSIS — M25562 Pain in left knee: Secondary | ICD-10-CM

## 2015-03-20 DIAGNOSIS — M25662 Stiffness of left knee, not elsewhere classified: Secondary | ICD-10-CM | POA: Diagnosis not present

## 2015-03-20 DIAGNOSIS — M6281 Muscle weakness (generalized): Secondary | ICD-10-CM

## 2015-03-20 DIAGNOSIS — R262 Difficulty in walking, not elsewhere classified: Secondary | ICD-10-CM

## 2015-03-21 ENCOUNTER — Encounter: Payer: Self-pay | Admitting: Physical Therapy

## 2015-03-21 NOTE — Therapy (Signed)
[facility] [address] [place], [place], [address] Phone: [phone]   Fax:  [phone]  Physical Therapy Treatment  Patient Details  Name: [name] [name] MRN: [id number] Date of Birth: June 06, 1950 No Data Recorded  Encounter Date: 03/20/2015      PT End of Session - 03/21/15 0832    Visit Number 21   Number of Visits 28   Date for PT Re-Evaluation 04/15/15   Authorization - Visit Number 21   Authorization - Number of Visits 30   PT Start Time 1856   PT Stop Time 1110   PT Time Calculation (min) 75 min   Equipment Utilized During Treatment --   Activity Tolerance Patient tolerated treatment well;Patient limited by pain   Behavior During Therapy [facility] for tasks assessed/performed      Past Medical History  Diagnosis Date  . Hypertension   . Arthritis   . Vertigo     hx post op  . Complication of anesthesia     terrible vertigo  . PONV (postoperative nausea and vomiting)     related to vertigo  . Shortness of breath     WITH EXERTION    Past Surgical History  Procedure Laterality Date  . Back surgery  2010    cervical fusion  . Foot surgery Left     heel---STATES METAL IN THE HEEL - FOOT TURNS OUTWARD  . Carpal tunnel release Right   . Breast surgery Left     biopsy  . Total knee revision Left 12/07/2012    Procedure:  TOTAL KNEE ARTHROPLASTY REVISION;  Surgeon: Gearlean Alf, MD;  Location: [facility];  Service: Orthopedics;  Laterality: Left;  . Knee closed reduction Left 02/15/2013    Procedure: CLOSED MANIPULATION LEFT KNEE;  Surgeon: Gearlean Alf, MD;  Location: [facility];  Service: Orthopedics;  Laterality: Left;  . Heel spur surgery    . Joint replacement Left 2013    knee  . Knee arthrotomy Left 05/29/2013    Procedure: LEFT KNEE ARTHROTOMY WITH SCAR EXCISION;  Surgeon: Gearlean Alf, MD;  Location: [facility];  Service: Orthopedics;  Laterality: Left;  . Knee arthrotomy Left 03/05/2014    Procedure:  LEFT KNEE ARTHROTOMY/SCAR EXCISION/POLYETHYLENE REVISION;  Surgeon: Gearlean Alf, MD;  [facility]: [facility];  Service: Orthopedics;  Laterality: Left;    There were no vitals filed for this visit.  Visit Diagnosis:  Pain in left knee  Joint stiffness of knee, left  Muscle weakness  Difficulty walking      Subjective Assessment - 03/21/15 0721    Subjective Pt reports no pain and wearing her brace at home for walking. Pt states she is sleeping in the brace. Pt reports stiffness in the front of her knee.   Limitations Walking;Standing;House hold activities   Patient Stated Goals Increase L knee flexion/ ext./ ambulate with more normalized gait with least assistive device/ambulate without her cane   Currently in Pain? No/denies        OBJECTIVE: L knee extension AROM in supine: lacking 18 degrees from neutral. L knee extension PROM in long sitting lacking 8 degrees from neutral. There ex: Nustep L7 15 mins (no charge). Total gym knee flexion x 30 with proper form. Verbal cues for set up but pt able to self-correct and maintain corrections. Total Gym calf raises x 30 with good form. Increased complaints of tightness with plantarflexion. Prostretch 30 seconds x 3 focusing on decreasing tightness and improving  gastroc flexibility. Contract/relax in prone for hamstring activation 5 x 5 second holds, 3 sets. Pt reports increased anterior knee tightness. Manual: In supine, patellar mobilizations grade III all 4 planes with good tracking noted, 4 x 30 seconds each direction. STM to distal quad with no complaints of pain. In prone, STM to proximal gastroc with tenderness in the popliteal fossa. Palpation to distal hamstring with no tenderness noted. Gait: Ambulation with SPC on unlevel terrain with no LOB noted. Pt had decreased L knee flexion with ambulation. Pt took 10 steps without use of SPC with decreased cadence and increased R hip hike.  Pt response to tx for medical necessity: Pt benefits from  manual to increase L knee extension and normalize her gait pattern. Pt benefits from gait training with SPC to improve functional mobility.           PT Long Term Goals - 03/18/15 1327    PT LONG TERM GOAL #1   Title Pt. I with HEP to increase L knee AROM (-5 to 90 deg.) to improve standing/ functional mobility.     Time 6   Period Weeks   Status Not Met   PT LONG TERM GOAL #2   Title Pt. will increase LEFS to >55 out of 80 to improve pain-free mobility.     Baseline LEFS: 40 out of 80 (02/11/15); LEFS: 24/80 on 03/18/15   Time 6   Period Weeks   Status Not Met   PT LONG TERM GOAL #3   Title Pt. able to ambulate community distances with no assistive device to improve functional mobility.    Baseline pt. using SPC primarily but ambulates short distances with no assistive device.   Time 6   Period Weeks   Status Not Met   PT LONG TERM GOAL #4   Title Pt. able to complete household chores on a daily basis with no increase c/o L knee pain or assistive device safely.     Time 6   Period Weeks   Status Not Met               Plan - 03/21/15 0835    Clinical Impression Statement Pt ambulates with decreased heel strike without verbal cues with SPC. Pt is able to ambulate over unlevel ground with SPC while maintaining cadence of level ground ambulation. Pt ambulates a short distance without use of SPC with decreased step length on the R LE and decreased stance time on the L. Pt is able to tolerate PROM to L knee extension to -7 in long sitting. Pt AROM L knee extension is lacking 18 degrees. Pt finds relief from prostretch for her gastroc tightness that is tender to palpation. Pt's medial hamstring is palpably tight but reports no pain with palpation.     Pt will benefit from skilled therapeutic intervention in order to improve on the following deficits Abnormal gait;Decreased endurance;Hypomobility;Increased edema;Decreased scar mobility;Decreased activity tolerance;Decreased  strength;Increased fascial restricitons;Pain;Difficulty walking;Decreased mobility;Decreased balance;Decreased range of motion;Improper body mechanics;Postural dysfunction;Impaired flexibility   Rehab Potential Good   PT Frequency 2x / week   PT Duration 6 weeks   PT Treatment/Interventions ADLs/Self Care Home Management;Neuromuscular re-education;Scar mobilization;Aquatic Therapy;Cryotherapy;Electrical Stimulation;Moist Heat;Balance training;Therapeutic exercise;Therapeutic activities;Functional mobility training;Stair training;Gait training;Patient/family education;Manual techniques;Energy conservation;Passive range of motion   PT Next Visit Plan Gastroc stretching/increase DF with subtalar mobs/gait with SPC over grass/total gym/stairs/lunges/reassess goals and POC   PT Home Exercise Plan continue current progression   Recommended Other Services [facility] or other wellness program  Consulted and Agree with Plan of Care Patient        Problem List Patient Active Problem List   Diagnosis Date Noted  . Arthrofibrosis of knee joint 03/05/2014  . Hypokalemia 05/30/2013  . OA (osteoarthritis) of knee 05/29/2013  . Postoperative stiffness of total knee replacement ([place]) 02/15/2013  . Postoperative anemia due to acute blood loss 12/09/2012  . 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 [place], [place], [address] Phone: [phone]   Fax:  [phone]  Name: [name] [name] MRN: [id number] Date of Birth: Jan 07, 1951

## 2015-03-25 ENCOUNTER — Ambulatory Visit: Payer: Medicare Other | Admitting: Physical Therapy

## 2015-03-25 DIAGNOSIS — M25662 Stiffness of left knee, not elsewhere classified: Secondary | ICD-10-CM

## 2015-03-25 DIAGNOSIS — M25562 Pain in left knee: Secondary | ICD-10-CM

## 2015-03-25 DIAGNOSIS — M6281 Muscle weakness (generalized): Secondary | ICD-10-CM

## 2015-03-25 DIAGNOSIS — R262 Difficulty in walking, not elsewhere classified: Secondary | ICD-10-CM

## 2015-03-25 NOTE — Therapy (Signed)
[facility] [address] [place], [place], [address] Phone: [phone]   Fax:  [phone]  Physical Therapy Treatment  Patient Details  Name: [name] [name] MRN: [id number] Date of Birth: 09/17/1950 No Data Recorded  Encounter Date: 03/25/2015      PT End of Session - 03/25/15 1304    Visit Number 22   Number of Visits 28   Date for PT Re-Evaluation 04/15/15   Authorization - Visit Number 53   Authorization - Number of Visits 30   PT Start Time 1013   PT Stop Time 1102   PT Time Calculation (min) 49 min   Activity Tolerance Patient tolerated treatment well;Patient limited by pain   Behavior During Therapy [facility] for tasks assessed/performed      Past Medical History  Diagnosis Date  . Hypertension   . Arthritis   . Vertigo     hx post op  . Complication of anesthesia     terrible vertigo  . PONV (postoperative nausea and vomiting)     related to vertigo  . Shortness of breath     WITH EXERTION    Past Surgical History  Procedure Laterality Date  . Back surgery  2010    cervical fusion  . Foot surgery Left     heel---STATES METAL IN THE HEEL - FOOT TURNS OUTWARD  . Carpal tunnel release Right   . Breast surgery Left     biopsy  . Total knee revision Left 12/07/2012    Procedure:  TOTAL KNEE ARTHROPLASTY REVISION;  Surgeon: Gearlean Alf, MD;  [facility];  Service: Orthopedics;  Laterality: Left;  . Knee closed reduction Left 02/15/2013    Procedure: CLOSED MANIPULATION LEFT KNEE;  Surgeon: Gearlean Alf, MD;  [facility];  Service: Orthopedics;  Laterality: Left;  . Heel spur surgery    . Joint replacement Left 2013    knee  . Knee arthrotomy Left 05/29/2013    Procedure: LEFT KNEE ARTHROTOMY WITH SCAR EXCISION;  Surgeon: Gearlean Alf, MD;  [facility];  Service: Orthopedics;  Laterality: Left;  . Knee arthrotomy Left 03/05/2014    Procedure: LEFT KNEE ARTHROTOMY/SCAR  EXCISION/POLYETHYLENE REVISION;  Surgeon: Gearlean Alf, MD;  [facility];  Service: Orthopedics;  Laterality: Left;    There were no vitals filed for this visit.  Visit Diagnosis:  Pain in left knee  Joint stiffness of knee, left  Muscle weakness  Difficulty walking      Subjective Assessment - 03/25/15 1302    Subjective No c/o knee pain but "knee joint tightness" reported numerous times during seated knee stretching.  Pt. wore knee ext. brace into PT clinic.     Limitations Walking;Standing;House hold activities   Patient Stated Goals Increase L knee flexion/ ext./ ambulate with more normalized gait with least assistive device/ambulate without her cane   Currently in Pain? No/denies           OBJECTIVE:   There ex:  Nustep L7 20 mins (no charge/ warm-up). Total gym knee flexion x 30 with proper form.  Seated B hip flexion/ L LAQ 20x each.  Sit to stands from low blue mat working on symmetrical B foot placement/ L knee flexion, esp. With eccentric control during sitting.  Manual: seated L patellar mobilizations grade III all 4 planes during knee ext./flexion, 4 x 30 seconds each direction. STM to distal quad/ hamstring with no complaints of pain.  Seated L  knee contract-relax technique for flexion and ext.  PROM L knee ext. 5x (max overpressure) as tolerated. Pt response to tx for medical necessity: Pt benefits from manual to increase L knee extension and normalize her gait pattern/ improve heel strike. Pain limited/ difficulty with descending final step due to knee flexion/ distal quad pain.          PT Long Term Goals - 03/18/15 1327    PT LONG TERM GOAL #1   Title Pt. I with HEP to increase L knee AROM (-5 to 90 deg.) to improve standing/ functional mobility.     Time 6   Period Weeks   Status Not Met   PT LONG TERM GOAL #2   Title Pt. will increase LEFS to >55 out of 80 to improve pain-free mobility.     Baseline LEFS: 40 out of 80 (02/11/15);  LEFS: 24/80 on 03/18/15   Time 6   Period Weeks   Status Not Met   PT LONG TERM GOAL #3   Title Pt. able to ambulate community distances with no assistive device to improve functional mobility.    Baseline pt. using SPC primarily but ambulates short distances with no assistive device.   Time 6   Period Weeks   Status Not Met   PT LONG TERM GOAL #4   Title Pt. able to complete household chores on a daily basis with no increase c/o L knee pain or assistive device safely.     Time 6   Period Weeks   Status Not Met            Plan - 03/25/15 1305    Clinical Impression Statement L knee AA/PROM in seated posture:  -7 to 95 deg.  Pain limited/ increase pressure in L knee joint during manul stretches/treatment.  Pt. continues to benefit from cuing for proper heel strike/ step pattern while ambulating in clinic.  Pt. able to ascend/ descend steps with recip. pattern but moderate difficulty/ extra time with descending final step (pain limited L knee flexion/ eccentric muscle control).  Pt. will continue with skilled PT services to maximize L knee ROM, esp. ext. to promte carryover.     Pt will benefit from skilled therapeutic intervention in order to improve on the following deficits Abnormal gait;Decreased endurance;Hypomobility;Increased edema;Decreased scar mobility;Decreased activity tolerance;Decreased strength;Increased fascial restricitons;Pain;Difficulty walking;Decreased mobility;Decreased balance;Decreased range of motion;Improper body mechanics;Postural dysfunction;Impaired flexibility   Rehab Potential Good   PT Frequency 2x / week   PT Duration 6 weeks   PT Treatment/Interventions ADLs/Self Care Home Management;Neuromuscular re-education;Scar mobilization;Aquatic Therapy;Cryotherapy;Electrical Stimulation;Moist Heat;Balance training;Therapeutic exercise;Therapeutic activities;Functional mobility training;Stair training;Gait training;Patient/family education;Manual techniques;Energy  conservation;Passive range of motion   PT Next Visit Plan Gastroc stretching/increase DF with subtalar mobs/gait with SPC over grass/total gym/stairs/lunges.   PT Home Exercise Plan continue current progression   Consulted and Agree with Plan of Care Patient        Problem List Patient Active Problem List   Diagnosis Date Noted  . Arthrofibrosis of knee joint 03/05/2014  . Hypokalemia 05/30/2013  . OA (osteoarthritis) of knee 05/29/2013  . Postoperative stiffness of total knee replacement ([place]) 02/15/2013  . Postoperative anemia due to acute blood loss 12/09/2012  . 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

## 2015-03-27 ENCOUNTER — Ambulatory Visit: Payer: Medicare Other | Admitting: Physical Therapy

## 2015-03-27 DIAGNOSIS — R262 Difficulty in walking, not elsewhere classified: Secondary | ICD-10-CM

## 2015-03-27 DIAGNOSIS — M25662 Stiffness of left knee, not elsewhere classified: Secondary | ICD-10-CM | POA: Diagnosis not present

## 2015-03-27 DIAGNOSIS — M25562 Pain in left knee: Secondary | ICD-10-CM

## 2015-03-27 DIAGNOSIS — M6281 Muscle weakness (generalized): Secondary | ICD-10-CM

## 2015-03-27 NOTE — Therapy (Signed)
[facility] [address] [place], [place], [address] Phone: [phone]   Fax:  [phone]  Physical Therapy Treatment  Patient Details  Name: [name] [name] MRN: [id number] Date of Birth: 07-19-1950 No Data Recorded  Encounter Date: 03/27/2015      PT End of Session - 03/27/15 1738    Visit Number 23   Number of Visits 28   Date for PT Re-Evaluation 04/15/15   Authorization - Visit Number 23   Authorization - Number of Visits 30   PT Start Time 9678   PT Stop Time 1117   PT Time Calculation (min) 54 min   Activity Tolerance Patient tolerated treatment well;Patient limited by pain   Behavior During Therapy [facility] for tasks assessed/performed      Past Medical History  Diagnosis Date  . Hypertension   . Arthritis   . Vertigo     hx post op  . Complication of anesthesia     terrible vertigo  . PONV (postoperative nausea and vomiting)     related to vertigo  . Shortness of breath     WITH EXERTION    Past Surgical History  Procedure Laterality Date  . Back surgery  2010    cervical fusion  . Foot surgery Left     heel---STATES METAL IN THE HEEL - FOOT TURNS OUTWARD  . Carpal tunnel release Right   . Breast surgery Left     biopsy  . Total knee revision Left 12/07/2012    Procedure:  TOTAL KNEE ARTHROPLASTY REVISION;  Surgeon: Gearlean Alf, MD;  [facility];  Service: Orthopedics;  Laterality: Left;  . Knee closed reduction Left 02/15/2013    Procedure: CLOSED MANIPULATION LEFT KNEE;  Surgeon: Gearlean Alf, MD;  [facility];  Service: Orthopedics;  Laterality: Left;  . Heel spur surgery    . Joint replacement Left 2013    knee  . Knee arthrotomy Left 05/29/2013    Procedure: LEFT KNEE ARTHROTOMY WITH SCAR EXCISION;  Surgeon: Gearlean Alf, MD;  [facility];  Service: Orthopedics;  Laterality: Left;  . Knee arthrotomy Left 03/05/2014    Procedure: LEFT KNEE ARTHROTOMY/SCAR  EXCISION/POLYETHYLENE REVISION;  Surgeon: Gearlean Alf, MD;  [facility];  Service: Orthopedics;  Laterality: Left;    There were no vitals filed for this visit.  Visit Diagnosis:  Pain in left knee  Joint stiffness of knee, left  Difficulty walking  Muscle weakness      Subjective Assessment - 03/27/15 1039    Subjective Pt. c/o "joint stiffness" in knee and B hips, no c/o pain at this time.  Pt. staying active with daily tasks and compliant with HEP.     Limitations Walking;Standing;House hold activities   Patient Stated Goals Increase L knee flexion/ ext./ ambulate with more normalized gait with least assistive device/ambulate without her cane   Currently in Pain? No/denies        OBJECTIVE:  There ex: Nustep L7 15 mins (no charge/ warm-up). Total gym knee flexion x 30 with increase static holds. Seated B hip flexion/ L LAQ 20x each. Sit to stands from low blue mat working on symmetrical B foot placement/ L knee flexion, esp. With eccentric control during sitting. Manual: seated L patellar mobilizations grade III all 4 planes during knee ext./flexion, 4 x 30 seconds each direction. STM to distal quad/ hamstring with no complaints of pain. Seated L knee contract-relax technique for flexion and  ext. PROM L knee ext. 5x (max overpressure) as tolerated. Pt response to tx for medical necessity: Pt benefits from manual to increase L knee extension and normalize her gait pattern/ improve heel strike. Pain limited/ difficulty with descending final step due to knee flexion/ distal quad pain.          PT Long Term Goals - 03/18/15 1327    PT LONG TERM GOAL #1   Title Pt. I with HEP to increase L knee AROM (-5 to 90 deg.) to improve standing/ functional mobility.     Time 6   Period Weeks   Status Not Met   PT LONG TERM GOAL #2   Title Pt. will increase LEFS to >55 out of 80 to improve pain-free mobility.     Baseline LEFS: 40 out of 80 (02/11/15); LEFS:  24/80 on 03/18/15   Time 6   Period Weeks   Status Not Met   PT LONG TERM GOAL #3   Title Pt. able to ambulate community distances with no assistive device to improve functional mobility.    Baseline pt. using SPC primarily but ambulates short distances with no assistive device.   Time 6   Period Weeks   Status Not Met   PT LONG TERM GOAL #4   Title Pt. able to complete household chores on a daily basis with no increase c/o L knee pain or assistive device safely.     Time 6   Period Weeks   Status Not Met           Plan - 03/27/15 1738    Clinical Impression Statement L knee AAROM in seated: -7 to 95 deg.  Pt. remains very pain limited with aggressive manual stretches/ PROM.  Moderate cuing to ascend/ descend stairs with recip. pattern and use of handrails.  Pt. will benefit from continued focus on L quad strengthening and knee ROM.   Pt will benefit from skilled therapeutic intervention in order to improve on the following deficits Abnormal gait;Decreased endurance;Hypomobility;Increased edema;Decreased scar mobility;Decreased activity tolerance;Decreased strength;Increased fascial restricitons;Pain;Difficulty walking;Decreased mobility;Decreased balance;Decreased range of motion;Improper body mechanics;Postural dysfunction;Impaired flexibility   Rehab Potential Good   PT Frequency 2x / week   PT Duration 6 weeks   PT Treatment/Interventions ADLs/Self Care Home Management;Neuromuscular re-education;Scar mobilization;Aquatic Therapy;Cryotherapy;Electrical Stimulation;Moist Heat;Balance training;Therapeutic exercise;Therapeutic activities;Functional mobility training;Stair training;Gait training;Patient/family education;Manual techniques;Energy conservation;Passive range of motion   PT Home Exercise Plan continue current progression   Consulted and Agree with Plan of Care Patient        Problem List Patient Active Problem List   Diagnosis Date Noted  . Arthrofibrosis of knee  joint 03/05/2014  . Hypokalemia 05/30/2013  . OA (osteoarthritis) of knee 05/29/2013  . Postoperative stiffness of total knee replacement ([place]) 02/15/2013  . Postoperative anemia due to acute blood loss 12/09/2012  . 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 [place], [place], [address] Phone: [phone]   Fax:  [phone]  Name: [name] [name] MRN: [id number] Date of Birth: Nov 07, 1950

## 2015-04-02 ENCOUNTER — Ambulatory Visit: Payer: Medicare Other | Admitting: Physical Therapy

## 2015-04-02 ENCOUNTER — Encounter: Payer: Self-pay | Admitting: Physical Therapy

## 2015-04-02 DIAGNOSIS — M25662 Stiffness of left knee, not elsewhere classified: Secondary | ICD-10-CM

## 2015-04-02 DIAGNOSIS — R262 Difficulty in walking, not elsewhere classified: Secondary | ICD-10-CM

## 2015-04-02 DIAGNOSIS — M6281 Muscle weakness (generalized): Secondary | ICD-10-CM

## 2015-04-02 DIAGNOSIS — M25562 Pain in left knee: Secondary | ICD-10-CM

## 2015-04-02 NOTE — Therapy (Signed)
[place] [facility] [address] [place], [place], [address] Phone: [phone]   Fax:  [phone]  Physical Therapy Treatment  Patient Details  Name: [name] [name] MRN: [id number] Date of Birth: 05/20/1950 No Data Recorded  Encounter Date: 04/02/2015      PT End of Session - 04/02/15 1101    Visit Number 24   Number of Visits 28   Date for PT Re-Evaluation 04/15/15   Authorization - Visit Number 24   Authorization - Number of Visits 30   PT Start Time 1031   PT Stop Time 1120   PT Time Calculation (min) 48 min   Activity Tolerance Patient tolerated treatment well;Patient limited by pain   Behavior During Therapy [facility] for tasks assessed/performed      Past Medical History  Diagnosis Date  . Hypertension   . Arthritis   . Vertigo     hx post op  . Complication of anesthesia     terrible vertigo  . PONV (postoperative nausea and vomiting)     related to vertigo  . Shortness of breath     WITH EXERTION    Past Surgical History  Procedure Laterality Date  . Back surgery  2010    cervical fusion  . Foot surgery Left     heel---STATES METAL IN THE HEEL - FOOT TURNS OUTWARD  . Carpal tunnel release Right   . Breast surgery Left     biopsy  . Total knee revision Left 12/07/2012    Procedure:  TOTAL KNEE ARTHROPLASTY REVISION;  Surgeon: Gearlean Alf, MD;  Location: [facility];  Service: Orthopedics;  Laterality: Left;  . Knee closed reduction Left 02/15/2013    Procedure: CLOSED MANIPULATION LEFT KNEE;  Surgeon: Gearlean Alf, MD;  Location: [facility];  Service: Orthopedics;  Laterality: Left;  . Heel spur surgery    . Joint replacement Left 2013    knee  . Knee arthrotomy Left 05/29/2013    Procedure: LEFT KNEE ARTHROTOMY WITH SCAR EXCISION;  Surgeon: Gearlean Alf, MD;  Location: [facility];  Service: Orthopedics;  Laterality: Left;  . Knee arthrotomy Left 03/05/2014    Procedure: LEFT KNEE ARTHROTOMY/SCAR  EXCISION/POLYETHYLENE REVISION;  Surgeon: Gearlean Alf, MD;  Location: [facility];  Service: Orthopedics;  Laterality: Left;    There were no vitals filed for this visit.  Visit Diagnosis:  Pain in left knee  Joint stiffness of knee, left  Difficulty walking  Muscle weakness      Subjective Assessment - 04/02/15 1101    Subjective Pt reports feeling "stiff" today. Pt reports soreness in R hip due to compensations with walking.   Limitations Walking;Standing;House hold activities   Patient Stated Goals Increase L knee flexion/ ext./ ambulate with more normalized gait with least assistive device/ambulate without her cane   Currently in Pain? No/denies      OBJECTIVE: There ex: Nustep L7 seat 8 warm up 15 mins (no charge). Total gym squats with static holds in extension for 5 seconds x 25. Gait around the clinic with cues for heel strike.  Discussed HEP and Nustep for home use of increase carryover once PT is completed.  Manual: long sitting extension stretching with overpressure from Mobilization belt. Pt able to tolerate 30 second holds x 5 without increased hip rotation. Supine, grade III mobilizations to increase L knee extension x 30 seconds/5. Good patellar mobility noted. Distal quad and hamstring not tender to palpation.  Pt response to  tx for medical necessity: Pt benefits from manual therapy to maintain L knee extension and strengthening for L quad.       PT Long Term Goals - 03/18/15 1327    PT LONG TERM GOAL #1   Title Pt. I with HEP to increase L knee AROM (-5 to 90 deg.) to improve standing/ functional mobility.     Time 6   Period Weeks   Status Not Met   PT LONG TERM GOAL #2   Title Pt. will increase LEFS to >55 out of 80 to improve pain-free mobility.     Baseline LEFS: 40 out of 80 (02/11/15); LEFS: 24/80 on 03/18/15   Time 6   Period Weeks   Status Not Met   PT LONG TERM GOAL #3   Title Pt. able to ambulate community distances with no assistive device to  improve functional mobility.    Baseline pt. using SPC primarily but ambulates short distances with no assistive device.   Time 6   Period Weeks   Status Not Met   PT LONG TERM GOAL #4   Title Pt. able to complete household chores on a daily basis with no increase c/o L knee pain or assistive device safely.     Time 6   Period Weeks   Status Not Met               Plan - 04/02/15 1138    Clinical Impression Statement Pt demonstrates increased L knee stiffness as compared to earlier treatment sessions. Pt is able to demonstrate proper extension stretching and educated on how to perform this at home. Pt tolerated manual stretching and joint mobilizations without any increased  symptoms. Pt ambulates with her SPC and inconsitent heel strike on the L. With verbal cues, pt increases L heel strike.    Pt will benefit from skilled therapeutic intervention in order to improve on the following deficits Abnormal gait;Decreased endurance;Hypomobility;Increased edema;Decreased scar mobility;Decreased activity tolerance;Decreased strength;Increased fascial restricitons;Pain;Difficulty walking;Decreased mobility;Decreased balance;Decreased range of motion;Improper body mechanics;Postural dysfunction;Impaired flexibility   Rehab Potential Good   PT Frequency 2x / week   PT Duration 6 weeks   PT Treatment/Interventions ADLs/Self Care Home Management;Neuromuscular re-education;Scar mobilization;Aquatic Therapy;Cryotherapy;Electrical Stimulation;Moist Heat;Balance training;Therapeutic exercise;Therapeutic activities;Functional mobility training;Stair training;Gait training;Patient/family education;Manual techniques;Energy conservation;Passive range of motion   PT Next Visit Plan extension/strengthening in standing/gait training   PT Home Exercise Plan continue current progression   Consulted and Agree with Plan of Care Patient        Problem List Patient Active Problem List   Diagnosis Date Noted   . Arthrofibrosis of knee joint 03/05/2014  . Hypokalemia 05/30/2013  . OA (osteoarthritis) of knee 05/29/2013  . Postoperative stiffness of total knee replacement ([place]) 02/15/2013  . Postoperative anemia due to acute blood loss 12/09/2012  . Failed total knee arthroplasty [facility]) 12/07/2012    Lavone Neri, SPT 04/02/2015, 11:43 AM  [place] [address] [place], [place], [address] Phone: [phone]   Fax:  [phone]  Name: [name] [name] MRN: [id number] Date of Birth: 27-Dec-1950

## 2015-04-04 ENCOUNTER — Ambulatory Visit: Payer: Medicare Other | Attending: Orthopedic Surgery | Admitting: Physical Therapy

## 2015-04-04 ENCOUNTER — Encounter: Payer: Self-pay | Admitting: Physical Therapy

## 2015-04-04 DIAGNOSIS — M6281 Muscle weakness (generalized): Secondary | ICD-10-CM | POA: Diagnosis present

## 2015-04-04 DIAGNOSIS — M25562 Pain in left knee: Secondary | ICD-10-CM

## 2015-04-04 DIAGNOSIS — R262 Difficulty in walking, not elsewhere classified: Secondary | ICD-10-CM | POA: Diagnosis present

## 2015-04-04 DIAGNOSIS — M25662 Stiffness of left knee, not elsewhere classified: Secondary | ICD-10-CM

## 2015-04-04 NOTE — Therapy (Signed)
[place] [facility] [address] [place], [place], [address] Phone: [phone]   Fax:  [phone]  Physical Therapy Treatment  Patient Details  Name: [name] [name] MRN: [id number] Date of Birth: [birth year] No Data Recorded  Encounter Date: 04/04/2015      PT End of Session - 04/04/15 1247    Visit Number 25   Number of Visits 28   Date for PT Re-Evaluation 04/15/15   Authorization - Visit Number 25   Authorization - Number of Visits 30   PT Start Time 6812   PT Stop Time 7517   PT Time Calculation (min) 50 min   Activity Tolerance Patient tolerated treatment well;No increased pain   Behavior During Therapy [facility] for tasks assessed/performed      Past Medical History  Diagnosis Date  . Hypertension   . Arthritis   . Vertigo     hx post op  . Complication of anesthesia     terrible vertigo  . PONV (postoperative nausea and vomiting)     related to vertigo  . Shortness of breath     WITH EXERTION    Past Surgical History  Procedure Laterality Date  . Back surgery  2010    cervical fusion  . Foot surgery Left     heel---STATES METAL IN THE HEEL - FOOT TURNS OUTWARD  . Carpal tunnel release Right   . Breast surgery Left     biopsy  . Total knee revision Left 12/07/2012    Procedure:  TOTAL KNEE ARTHROPLASTY REVISION;  Surgeon: Gearlean Alf, MD;  Location: [facility];  Service: Orthopedics;  Laterality: Left;  . Knee closed reduction Left 02/15/2013    Procedure: CLOSED MANIPULATION LEFT KNEE;  Surgeon: Gearlean Alf, MD;  Location: [facility];  Service: Orthopedics;  Laterality: Left;  . Heel spur surgery    . Joint replacement Left 2013    knee  . Knee arthrotomy Left 05/29/2013    Procedure: LEFT KNEE ARTHROTOMY WITH SCAR EXCISION;  Surgeon: Gearlean Alf, MD;  [facility];  Service: Orthopedics;  Laterality: Left;  . Knee arthrotomy Left 03/05/2014    Procedure: LEFT KNEE ARTHROTOMY/SCAR EXCISION/POLYETHYLENE  REVISION;  Surgeon: Gearlean Alf, MD;  [facility];  Service: Orthopedics;  Laterality: Left;    There were no vitals filed for this visit.  Visit Diagnosis:  Pain in left knee  Joint stiffness of knee, left  Difficulty walking  Muscle weakness      Subjective Assessment - 04/04/15 1246    Subjective Pt reports no pain today. Pt states she has not been wearing her extension brace at night.    Limitations Walking;Standing;House hold activities   Patient Stated Goals Increase L knee flexion/ ext./ ambulate with more normalized gait with least assistive device/ambulate without her cane   Currently in Pain? No/denies      OBJECTIVE: There ex: Nustep L7 warmup (no charge). Wall squats with green therapy ball x 30. Increased muscle fasciculations noted and complaints of increased R knee pain noted. Balance on Airex with Eyes open/eyes closed 30 seconds x 4 each. Pt demonstrates increased anterior sway with eyes closed. Single leg stance on foam 10 seconds x 5 each. Pt had difficulty with single leg balance on L and required use of B UE support. Tandem walking and tandem stance with L LE behind (pt unable to keep L heel on the ground when L LE behind). Manual: STM to distal L  quad. No complaints of increased tenderness. Patellar mobilizations grade III all 4 planes 30 seconds x 4. Increased complaints of tightness with superior mobilization. Manual PROM to increase knee extension with long axis distraction.  Pt response to tx for medical necessity: Pt benefits from manual therapy to maintain knee extension. Pt benefits from strengthening and gait training to decrease toe walking habits.       PT Long Term Goals - 03/18/15 1327    PT LONG TERM GOAL #1   Title Pt. I with HEP to increase L knee AROM (-5 to 90 deg.) to improve standing/ functional mobility.     Time 6   Period Weeks   Status Not Met   PT LONG TERM GOAL #2   Title Pt. will increase LEFS to >55 out of 80 to improve  pain-free mobility.     Baseline LEFS: 40 out of 80 (02/11/15); LEFS: 24/80 on 03/18/15   Time 6   Period Weeks   Status Not Met   PT LONG TERM GOAL #3   Title Pt. able to ambulate community distances with no assistive device to improve functional mobility.    Baseline pt. using SPC primarily but ambulates short distances with no assistive device.   Time 6   Period Weeks   Status Not Met   PT LONG TERM GOAL #4   Title Pt. able to complete household chores on a daily basis with no increase c/o L knee pain or assistive device safely.     Time 6   Period Weeks   Status Not Met               Plan - 04/04/15 1247    Clinical Impression Statement Pt ambulates with old habit of toe walking on L side. Pt is able to correct toe walking habit with verbal cues but does not maintain corrections for any length of ambulation. Pt ambulate with increased L knee flexion and is unable to reach knee extension with ambulation. Pt has difficulty with squats due to weakness.    Pt will benefit from skilled therapeutic intervention in order to improve on the following deficits Abnormal gait;Decreased endurance;Hypomobility;Increased edema;Decreased scar mobility;Decreased activity tolerance;Decreased strength;Increased fascial restricitons;Pain;Difficulty walking;Decreased mobility;Decreased balance;Decreased range of motion;Improper body mechanics;Postural dysfunction;Impaired flexibility   Rehab Potential Good   PT Frequency 2x / week   PT Duration 6 weeks   PT Treatment/Interventions ADLs/Self Care Home Management;Neuromuscular re-education;Scar mobilization;Aquatic Therapy;Cryotherapy;Electrical Stimulation;Moist Heat;Balance training;Therapeutic exercise;Therapeutic activities;Functional mobility training;Stair training;Gait training;Patient/family education;Manual techniques;Energy conservation;Passive range of motion   PT Next Visit Plan extension/strengthening in standing/gait training   PT Home  Exercise Plan continue current progression   Consulted and Agree with Plan of Care Patient        Problem List Patient Active Problem List   Diagnosis Date Noted  . Arthrofibrosis of knee joint 03/05/2014  . Hypokalemia 05/30/2013  . OA (osteoarthritis) of knee 05/29/2013  . Postoperative stiffness of total knee replacement ([place]) 02/15/2013  . Postoperative anemia due to acute blood loss 12/09/2012  . 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 [place], [place], [address] Phone: [phone]   Fax:  [phone]  Name: [name] [name] MRN: [id number] Date of Birth: [birth year]

## 2015-04-09 ENCOUNTER — Ambulatory Visit: Payer: Medicare Other | Admitting: Physical Therapy

## 2015-04-09 DIAGNOSIS — M25562 Pain in left knee: Secondary | ICD-10-CM

## 2015-04-09 DIAGNOSIS — M25662 Stiffness of left knee, not elsewhere classified: Secondary | ICD-10-CM

## 2015-04-09 DIAGNOSIS — R262 Difficulty in walking, not elsewhere classified: Secondary | ICD-10-CM

## 2015-04-09 DIAGNOSIS — M6281 Muscle weakness (generalized): Secondary | ICD-10-CM

## 2015-04-10 ENCOUNTER — Encounter: Payer: Self-pay | Admitting: Physical Therapy

## 2015-04-10 NOTE — Therapy (Signed)
[place] [facility] [address] [place], [place], [address] Phone: [phone]   Fax:  [phone]  Physical Therapy Treatment  Patient Details  Name: [name] [name] MRN: [id number] Date of Birth: [birth year] No Data Recorded  Encounter Date: 04/09/2015      PT End of Session - 04/10/15 1327    Visit Number 26   Number of Visits 28   Date for PT Re-Evaluation 04/15/15   Authorization - Visit Number 26   Authorization - Number of Visits 30   PT Start Time 1025   PT Stop Time 1120   PT Time Calculation (min) 55 min   Activity Tolerance Patient tolerated treatment well;Patient limited by pain   Behavior During Therapy [facility] for tasks assessed/performed      Past Medical History  Diagnosis Date  . Hypertension   . Arthritis   . Vertigo     hx post op  . Complication of anesthesia     terrible vertigo  . PONV (postoperative nausea and vomiting)     related to vertigo  . Shortness of breath     WITH EXERTION    Past Surgical History  Procedure Laterality Date  . Back surgery  2010    cervical fusion  . Foot surgery Left     heel---STATES METAL IN THE HEEL - FOOT TURNS OUTWARD  . Carpal tunnel release Right   . Breast surgery Left     biopsy  . Total knee revision Left 12/07/2012    Procedure:  TOTAL KNEE ARTHROPLASTY REVISION;  Surgeon: Gearlean Alf, MD;  [facility];  Service: Orthopedics;  Laterality: Left;  . Knee closed reduction Left 02/15/2013    Procedure: CLOSED MANIPULATION LEFT KNEE;  Surgeon: Gearlean Alf, MD;  [facility]: [facility];  Service: Orthopedics;  Laterality: Left;  . Heel spur surgery    . Joint replacement Left 2013    knee  . Knee arthrotomy Left 05/29/2013    Procedure: LEFT KNEE ARTHROTOMY WITH SCAR EXCISION;  Surgeon: Gearlean Alf, MD;  [facility];  Service: Orthopedics;  Laterality: Left;  . Knee arthrotomy Left 03/05/2014    Procedure: LEFT KNEE ARTHROTOMY/SCAR  EXCISION/POLYETHYLENE REVISION;  Surgeon: Gearlean Alf, MD;  [facility]: [facility];  Service: Orthopedics;  Laterality: Left;    There were no vitals filed for this visit.  Visit Diagnosis:  Pain in left knee  Joint stiffness of knee, left  Difficulty walking  Muscle weakness      Subjective Assessment - 04/10/15 1326    Subjective No new complaints reported.     Limitations Walking;Standing;House hold activities   Patient Stated Goals Increase L knee flexion/ ext./ ambulate with more normalized gait with least assistive device/ambulate without her cane   Currently in Pain? No/denies      OBJECTIVE: There ex: Nustep L7 warmup (no charge)- 15 min. 2nd step lunges progressing into recip. Step ups (focusing on preventing hip circumduction) with difficulty descending with R LE due to L knee joint stiffness/ poor eccentric muscle control.  Reviewed knee ex. At home/ extension positioning and brace use.  Manual: STM to distal L quad/ incision.  Patellar mobilizations grade III all 4-planes 30 seconds x 4.  Manual PROM to increase knee extension with long axis distraction. Pt response to tx for medical necessity: Pt benefits from manual therapy to maintain knee extension. Pt benefits from strengthening and gait training to decrease toe walking habits.  PT Long Term Goals - 03/18/15 1327    PT LONG TERM GOAL #1   Title Pt. I with HEP to increase L knee AROM (-5 to 90 deg.) to improve standing/ functional mobility.     Time 6   Period Weeks   Status Not Met   PT LONG TERM GOAL #2   Title Pt. will increase LEFS to >55 out of 80 to improve pain-free mobility.     Baseline LEFS: 40 out of 80 (02/11/15); LEFS: 24/80 on 03/18/15   Time 6   Period Weeks   Status Not Met   PT LONG TERM GOAL #3   Title Pt. able to ambulate community distances with no assistive device to improve functional mobility.    Baseline pt. using SPC primarily but ambulates short distances with no  assistive device.   Time 6   Period Weeks   Status Not Met   PT LONG TERM GOAL #4   Title Pt. able to complete household chores on a daily basis with no increase c/o L knee pain or assistive device safely.     Time 6   Period Weeks   Status Not Met            Plan - 04/10/15 1327    Clinical Impression Statement Pt. limited by significant L knee joint stiffness and pain with aggressive stretching.  No regression in progress made with PT but limited progression over past 2 weeks wtih L knee extension and flexion.  Pt. has not been wearing knee brace to encourage knee extension.  Probable discharge next visit with HEP focus.     Pt will benefit from skilled therapeutic intervention in order to improve on the following deficits Abnormal gait;Decreased endurance;Hypomobility;Increased edema;Decreased scar mobility;Decreased activity tolerance;Decreased strength;Increased fascial restricitons;Pain;Difficulty walking;Decreased mobility;Decreased balance;Decreased range of motion;Improper body mechanics;Postural dysfunction;Impaired flexibility   Rehab Potential Good   PT Frequency 2x / week   PT Duration 6 weeks   PT Treatment/Interventions ADLs/Self Care Home Management;Neuromuscular re-education;Scar mobilization;Aquatic Therapy;Cryotherapy;Electrical Stimulation;Moist Heat;Balance training;Therapeutic exercise;Therapeutic activities;Functional mobility training;Stair training;Gait training;Patient/family education;Manual techniques;Energy conservation;Passive range of motion   PT Next Visit Plan extension/strengthening in standing/gait training   PT Home Exercise Plan continue current progression   Consulted and Agree with Plan of Care Patient        Problem List Patient Active Problem List   Diagnosis Date Noted  . Arthrofibrosis of knee joint 03/05/2014  . Hypokalemia 05/30/2013  . OA (osteoarthritis) of knee 05/29/2013  . Postoperative stiffness of total knee replacement ([place])  02/15/2013  . Postoperative anemia due to acute blood loss 12/09/2012  . Failed total knee arthroplasty ([place]) 12/07/2012   Pura Spice, PT, DPT # [phone]   04/10/2015, 1:34 PM  [place] [facility] [address] [place], [place], [address] Phone: [phone]   Fax:  [phone]  Name: [name] [name] MRN: [id number] Date of Birth: [birth year]

## 2015-04-11 ENCOUNTER — Ambulatory Visit: Payer: Medicare Other | Admitting: Physical Therapy

## 2015-04-11 ENCOUNTER — Encounter: Payer: Self-pay | Admitting: Physical Therapy

## 2015-04-11 DIAGNOSIS — M25562 Pain in left knee: Secondary | ICD-10-CM | POA: Diagnosis not present

## 2015-04-11 DIAGNOSIS — M25662 Stiffness of left knee, not elsewhere classified: Secondary | ICD-10-CM

## 2015-04-11 DIAGNOSIS — R262 Difficulty in walking, not elsewhere classified: Secondary | ICD-10-CM

## 2015-04-11 DIAGNOSIS — M6281 Muscle weakness (generalized): Secondary | ICD-10-CM

## 2015-04-12 NOTE — Therapy (Signed)
[facility] [address]. [place], [place], [address] Phone: [phone]   Fax:  [phone]  Physical Therapy Treatment  Patient Details  Name: [name] [name] MRN: [id number] Date of Birth: July 11, 1950 No Data Recorded  Encounter Date: 04/11/2015      PT End of Session - 04/12/15 1446    Visit Number 27   Number of Visits 28   Date for PT Re-Evaluation 04/15/15   Authorization - Visit Number 55   Authorization - Number of Visits 30   PT Start Time 0263   PT Stop Time 1119   PT Time Calculation (min) 51 min   Activity Tolerance Patient tolerated treatment well;Patient limited by pain   Behavior During Therapy [facility] for tasks assessed/performed      Past Medical History  Diagnosis Date  . Hypertension   . Arthritis   . Vertigo     hx post op  . Complication of anesthesia     terrible vertigo  . PONV (postoperative nausea and vomiting)     related to vertigo  . Shortness of breath     WITH EXERTION    Past Surgical History  Procedure Laterality Date  . Back surgery  2010    cervical fusion  . Foot surgery Left     heel---STATES METAL IN THE HEEL - FOOT TURNS OUTWARD  . Carpal tunnel release Right   . Breast surgery Left     biopsy  . Total knee revision Left 12/07/2012    Procedure:  TOTAL KNEE ARTHROPLASTY REVISION;  Surgeon: Gearlean Alf, MD;  Location: [facility];  Service: Orthopedics;  Laterality: Left;  . Knee closed reduction Left 02/15/2013    Procedure: CLOSED MANIPULATION LEFT KNEE;  Surgeon: Gearlean Alf, MD;  Location: [facility];  Service: Orthopedics;  Laterality: Left;  . Heel spur surgery    . Joint replacement Left 2013    knee  . Knee arthrotomy Left 05/29/2013    Procedure: LEFT KNEE ARTHROTOMY WITH SCAR EXCISION;  Surgeon: Gearlean Alf, MD;  Location: [facility];  Service: Orthopedics;  Laterality: Left;  . Knee arthrotomy Left 03/05/2014    Procedure: LEFT KNEE ARTHROTOMY/SCAR  EXCISION/POLYETHYLENE REVISION;  Surgeon: Gearlean Alf, MD;  Location: [facility];  Service: Orthopedics;  Laterality: Left;    There were no vitals filed for this visit.  Visit Diagnosis:  Pain in left knee  Joint stiffness of knee, left  Difficulty walking  Muscle weakness      Subjective Assessment - 04/12/15 1436    Subjective Pt. states she continues to have persistent L knee joint stiffness, not pain.  Pt. not wearing knee brace and entered PT clinic with poor heel strike.     Limitations Walking;Standing;House hold activities   Patient Stated Goals Increase L knee flexion/ ext./ ambulate with more normalized gait with least assistive device/ambulate without her cane   Currently in Pain? No/denies        OBJECTIVE: There ex: Nustep L7 warmup (no charge)- 15 min. 2nd step lunges progressing into recip. Step ups (focusing on preventing hip circumduction) with difficulty descending with R LE due to L knee joint stiffness/ poor eccentric muscle control. TG knee flexion (static holds)/ attempted heel raises with L knee ext. Focus.  Discussed gym based ex. Manual: STM to distal L quad/ incision. Patellar mobilizations grade III all 4-planes 30 seconds x 4. Manual PROM to increase knee extension and flexion as tolerated (max potential).  Pt response to tx for medical necessity: Max potential with skilled PT services at this time.  Discharge to gym based ex.          PT Long Term Goals - 04/12/15 1451    PT LONG TERM GOAL #1   Title Pt. I with HEP to increase L knee AROM (-5 to 90 deg.) to improve standing/ functional mobility.     Baseline -14 to 90 deg.   Time 6   Period Weeks   Status Partially Met   PT LONG TERM GOAL #2   Title Pt. will increase LEFS to >55 out of 80 to improve pain-free mobility.     Baseline LEFS: 40 out of 80 (02/11/15); LEFS: 24/80 on 03/18/15   Time 6   Period Weeks   Status Not Met   PT LONG TERM GOAL #3   Title Pt. able to  ambulate community distances with no assistive device to improve functional mobility.    Baseline pt. using SPC primarily but ambulates short distances with no assistive device.   Time 6   Period Weeks   Status Not Met   PT LONG TERM GOAL #4   Title Pt. able to complete household chores on a daily basis with no increase c/o L knee pain or assistive device safely.     Time 6   Period Weeks   Status Partially Met               Plan - 04/12/15 1447    Clinical Impression Statement Progress with L knee ext./flexion remains limited by significant joint stiffness.  L knee PROM limited to -10 deg. ext. in supine position.  Max. potential with skilled PT services at this time and PT recommends discharge with focus on an independent gym based/ HEP.  Discharge from PT.     Pt will benefit from skilled therapeutic intervention in order to improve on the following deficits Abnormal gait;Decreased endurance;Hypomobility;Increased edema;Decreased scar mobility;Decreased activity tolerance;Decreased strength;Increased fascial restricitons;Pain;Difficulty walking;Decreased mobility;Decreased balance;Decreased range of motion;Improper body mechanics;Postural dysfunction;Impaired flexibility   Rehab Potential Good   PT Frequency 2x / week   PT Duration 6 weeks   PT Treatment/Interventions ADLs/Self Care Home Management;Neuromuscular re-education;Scar mobilization;Aquatic Therapy;Cryotherapy;Electrical Stimulation;Moist Heat;Balance training;Therapeutic exercise;Therapeutic activities;Functional mobility training;Stair training;Gait training;Patient/family education;Manual techniques;Energy conservation;Passive range of motion   PT Next Visit Plan Discharge from PT at this time.     PT Home Exercise Plan continue current progression   Consulted and Agree with Plan of Care Patient          G-Codes - May 02, 2015 1453    Functional Assessment Tool Used LEFS/ clinical judgement/ pain/ gait   Functional  Limitation Mobility: Walking and moving around   Mobility: Walking and Moving Around Current Status [phone]) At least 20 percent but less than 40 percent impaired, limited or restricted   Mobility: Walking and Moving Around Goal Status [phone]) At least 20 percent but less than 40 percent impaired, limited or restricted   Mobility: Walking and Moving Around Discharge Status [phone]) At least 20 percent but less than 40 percent impaired, limited or restricted      Problem List Patient Active Problem List   Diagnosis Date Noted  . Arthrofibrosis of knee joint 03/05/2014  . Hypokalemia 05/30/2013  . OA (osteoarthritis) of knee 05/29/2013  . Postoperative stiffness of total knee replacement ([place]) 02/15/2013  . Postoperative anemia due to acute blood loss 12/09/2012  . Failed total knee arthroplasty ([place]) 12/07/2012   Rebeca Alert  Clementeen Hoof, PT, DPT # [id number]   04/12/2015, 2:55 PM  [facility] [address] [place], [place], [address] Phone: [phone]   Fax:  [phone]  Name: ETIENNE MILLWARD MRN: [id number] Date of Birth: Feb 28, 1951

## 2015-04-15 ENCOUNTER — Encounter: Payer: Medicare Other | Admitting: Physical Therapy

## 2015-04-17 ENCOUNTER — Encounter: Payer: Medicare Other | Admitting: Physical Therapy

## 2015-04-30 ENCOUNTER — Emergency Department
Admission: EM | Admit: 2015-04-30 | Discharge: 2015-04-30 | Disposition: A | Discharge status: Home / Self Care | Payer: Medicare Other | Attending: Emergency Medicine | Admitting: Emergency Medicine

## 2015-04-30 ENCOUNTER — Encounter: Payer: Self-pay | Admitting: Emergency Medicine

## 2015-04-30 DIAGNOSIS — H5500 Unspecified nystagmus: Secondary | ICD-10-CM | POA: Diagnosis not present

## 2015-04-30 DIAGNOSIS — I10 Essential (primary) hypertension: Secondary | ICD-10-CM | POA: Insufficient documentation

## 2015-04-30 DIAGNOSIS — R42 Dizziness and giddiness: Secondary | ICD-10-CM | POA: Insufficient documentation

## 2015-04-30 DIAGNOSIS — R1110 Vomiting, unspecified: Secondary | ICD-10-CM | POA: Diagnosis not present

## 2015-04-30 DIAGNOSIS — Z79899 Other long term (current) drug therapy: Secondary | ICD-10-CM | POA: Diagnosis not present

## 2015-04-30 LAB — COMPREHENSIVE METABOLIC PANEL
ALBUMIN: 4 g/dL (ref 3.5–5.0)
ALK PHOS: 116 U/L (ref 38–126)
ALT: 18 U/L (ref 14–54)
AST: 24 U/L (ref 15–41)
Anion gap: 8 (ref 5–15)
BILIRUBIN TOTAL: 0.6 mg/dL (ref 0.3–1.2)
BUN: 16 mg/dL (ref 6–20)
CALCIUM: 9.4 mg/dL (ref 8.9–10.3)
CO2: 30 mmol/L (ref 22–32)
Chloride: 103 mmol/L (ref 101–111)
Creatinine, Ser: 0.55 mg/dL (ref 0.44–1.00)
GFR calc Af Amer: >60 mL/min (ref >60)
GFR calc non Af Amer: >60 mL/min (ref >60)
GLUCOSE: 107 mg/dL — AB (ref 65–99)
Potassium: 4.1 mmol/L (ref 3.5–5.1)
Sodium: 141 mmol/L (ref 135–145)
TOTAL PROTEIN: 7.7 g/dL (ref 6.5–8.1)

## 2015-04-30 LAB — CBC
HEMATOCRIT: 40.2 % (ref 35.0–47.0)
HEMOGLOBIN: 13.1 g/dL (ref 12.0–16.0)
MCH: 26.8 pg (ref 26.0–34.0)
MCHC: 32.7 g/dL (ref 32.0–36.0)
MCV: 82.1 fL (ref 80.0–100.0)
Platelets: 344 10*3/uL (ref 150–440)
RBC: 4.9 MIL/uL (ref 3.80–5.20)
RDW: 15.2 % — ABNORMAL HIGH (ref 11.5–14.5)
WBC: 9.4 10*3/uL (ref 3.6–11.0)

## 2015-04-30 MED ORDER — DIAZEPAM 5 MG/ML IJ SOLN
5.0000 mg | Freq: Once | INTRAMUSCULAR | Status: AC
  Administered 2015-04-30: 5 mg via INTRAVENOUS

## 2015-04-30 MED ORDER — MECLIZINE HCL 25 MG PO TABS
ORAL_TABLET | ORAL | Status: AC
  Administered 2015-04-30: 50 mg via ORAL
  Filled 2015-04-30: qty 2

## 2015-04-30 MED ORDER — MECLIZINE HCL 25 MG PO TABS: 25.0000 mg | ORAL_TABLET | Freq: Three times a day (TID) | ORAL | Status: AC | PRN

## 2015-04-30 MED ORDER — ONDANSETRON 4 MG PO TBDP
4.0000 mg | ORAL_TABLET | Freq: Once | ORAL | Status: AC
  Administered 2015-04-30: 4 mg via ORAL

## 2015-04-30 MED ORDER — ONDANSETRON 4 MG PO TBDP
ORAL_TABLET | ORAL | Status: AC
  Filled 2015-04-30: qty 1

## 2015-04-30 MED ORDER — DIAZEPAM 5 MG/ML IJ SOLN
INTRAMUSCULAR | Status: AC
  Administered 2015-04-30: 5 mg via INTRAVENOUS
  Filled 2015-04-30: qty 2

## 2015-04-30 MED ORDER — ONDANSETRON HCL 4 MG PO TABS: 4.0000 mg | ORAL_TABLET | Freq: Every day | ORAL | Status: DC | PRN

## 2015-04-30 MED ORDER — MECLIZINE HCL 25 MG PO TABS
50.0000 mg | ORAL_TABLET | Freq: Once | ORAL | Status: AC
  Administered 2015-04-30: 50 mg via ORAL

## 2015-04-30 MED ORDER — SODIUM CHLORIDE 0.9 % IV BOLUS (SEPSIS)
1000.0000 mL | Freq: Once | INTRAVENOUS | Status: AC
  Administered 2015-04-30: 1000 mL via INTRAVENOUS
  Filled 2015-04-30: qty 1000

## 2015-04-30 NOTE — ED Notes (Signed)
Pt states around 2am she woke with dizziness, N/V  Pt has a hx of vertigo and this feels the same as in the past..

## 2015-04-30 NOTE — ED Notes (Signed)
Patient medicated for dizziness. Resting comfortably. Call bell within reach. Family at bedside.

## 2015-04-30 NOTE — ED Notes (Signed)
Brought over from [facility] with  Dizziness and headacheN/v times 1

## 2015-04-30 NOTE — ED Notes (Signed)
Pt vomiting in triage within 42min of taking meclizine.

## 2015-04-30 NOTE — ED Provider Notes (Addendum)
[facility] Emergency Department Provider Note  ____________________________________________   I have reviewed the triage vital signs and the nursing notes.   HISTORY  Chief Complaint No chief complaint on file.    HPI [name] [name] is a 64 y.o. female with a long history she states of vertigo. She has had multiple episodes of vertigo in her life. She states she has had CTs and MRIs as an outpatient for her vertigo. She states that she started having her normal vertigo again this morning. This is the second time this year. She normally takes meclizine and it goes away however she was out of meclizine. She also has used repositioning exercises at home with good success. She was given meclizine in the waiting room and is starting to feel better. She has had some vomiting and true vertigo. She denies any focal numbness or weakness. No head trauma. To me she denies headache.  Past Medical History  Diagnosis Date  . Hypertension   . Arthritis   . Vertigo     hx post op  . Complication of anesthesia     terrible vertigo  . PONV (postoperative nausea and vomiting)     related to vertigo  . Shortness of breath     WITH EXERTION    Patient Active Problem List   Diagnosis Date Noted  . Arthrofibrosis of knee joint 03/05/2014  . Hypokalemia 05/30/2013  . OA (osteoarthritis) of knee 05/29/2013  . Postoperative stiffness of total knee replacement ([place]) 02/15/2013  . Postoperative anemia due to acute blood loss 12/09/2012  . 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 Back surgery  2010    cervical fusion  . Foot surgery Left     heel---STATES METAL IN THE HEEL - FOOT TURNS OUTWARD  . Carpal tunnel release Right   . Breast surgery Left     biopsy  . Total knee revision Left 12/07/2012    Procedure:  TOTAL KNEE ARTHROPLASTY REVISION;  Surgeon: Gearlean Alf, MD;  Location: [facility];  Service:  Orthopedics;  Laterality: Left;  . Knee closed reduction Left 02/15/2013    Procedure: CLOSED MANIPULATION LEFT KNEE;  Surgeon: Gearlean Alf, MD;  Location: [facility];  Service: Orthopedics;  Laterality: Left;  . Heel spur surgery    . Joint replacement Left 2013    knee  . Knee arthrotomy Left 05/29/2013    Procedure: LEFT KNEE ARTHROTOMY WITH SCAR EXCISION;  Surgeon: Gearlean Alf, MD;  Location: [facility];  Service: Orthopedics;  Laterality: Left;  . Knee arthrotomy Left 03/05/2014    Procedure: LEFT KNEE ARTHROTOMY/SCAR EXCISION/POLYETHYLENE REVISION;  Surgeon: Gearlean Alf, MD;  Location: [facility];  Service: Orthopedics;  Laterality: Left;    Current Outpatient Rx  Name  Route  Sig  Dispense  Refill  . acetaminophen (TYLENOL) 500 MG tablet   Oral   Take 500 mg by mouth every 6 (six) hours as needed for pain.         Marland Kitchen albuterol (VENTOLIN HFA) 108 (90 BASE) MCG/ACT inhaler   Inhalation   Inhale 2 puffs into the lungs every 6 (six) hours as needed for wheezing or shortness of breath.         . bisacodyl (DULCOLAX) 10 MG suppository   Rectal   Place 1 suppository (10 mg total) rectally daily as needed for moderate constipation.   12 suppository   0   .  docusate sodium 100 MG CAPS   Oral   Take 100 mg by mouth 2 (two) times daily.   10 capsule   0   . hydrochlorothiazide (HYDRODIURIL) 25 MG tablet   Oral   Take 25 mg by mouth every morning.         Marland Kitchen HYDROmorphone (DILAUDID) 2 MG tablet   Oral   Take 1-2 tablets (2-4 mg total) by mouth every 4 (four) hours as needed for moderate pain or severe pain.   90 tablet   0   . meclizine (ANTIVERT) 25 MG tablet   Oral   Take 25 mg by mouth 3 (three) times daily as needed for dizziness.          . methocarbamol (ROBAXIN) 500 MG tablet   Oral   Take 1 tablet (500 mg total) by mouth every 6 (six) hours as needed for muscle spasms.   80 tablet   0   . metoCLOPramide (REGLAN) 5 MG tablet   Oral   Take 1-2 tablets  (5-10 mg total) by mouth every 8 (eight) hours as needed for nausea (if ondansetron (ZOFRAN) ineffective.).   40 tablet   0   . ondansetron (ZOFRAN) 4 MG tablet   Oral   Take 1 tablet (4 mg total) by mouth every 6 (six) hours as needed for nausea.   40 tablet   0   . polyethylene glycol (MIRALAX / GLYCOLAX) packet   Oral   Take 17 g by mouth daily as needed for mild constipation.   14 each   0   . traMADol (ULTRAM) 50 MG tablet   Oral   Take 1-2 tablets (50-100 mg total) by mouth every 6 (six) hours as needed (mild pain).   60 tablet   1     Allergies Oxycodone  No family history on file.  Social History Social History  Substance Use Topics  . Smoking status: Never Smoker   . Smokeless tobacco: Never Used  . Alcohol Use: No    Review of Systems Constitutional: No fever/chills Eyes: No visual changes. ENT: No sore throat. No stiff neck no neck pain Cardiovascular: Denies chest pain. Respiratory: Denies shortness of breath. Gastrointestinal:   Positive vomiting.  No diarrhea.  No constipation. Genitourinary: Negative for dysuria. Musculoskeletal: Negative lower extremity swelling Skin: Negative for rash. Neurological: Negative for headaches, focal weakness or numbness. Positive for spinning sensation 10-point ROS otherwise negative.  ____________________________________________   PHYSICAL EXAM:  VITAL SIGNS: ED Triage Vitals  Enc Vitals Group     BP 04/30/15 1300 158/74 mmHg     Pulse Rate 04/30/15 1300 55     Resp 04/30/15 1300 16     Temp 04/30/15 1300 97.6 F (36.4 C)     Temp Source 04/30/15 1300 Oral     SpO2 04/30/15 1300 100 %     Weight 04/30/15 1300 213 lb (96.616 kg)     Height 04/30/15 1300 5\' 10"  (1.778 m)     Head Cir --      Peak Flow --      Pain Score 04/30/15 1220 8     Pain Loc --      Pain Edu? --      Excl. in [place]? --     Constitutional: Alert and oriented. Well appearing and in no acute distress. Eyes: Conjunctivae are  normal. PERRL. EOMI. Head: Atraumatic. Nose: No congestion/rhinnorhea. Mouth/Throat: Mucous membranes are moist.  Oropharynx non-erythematous. Neck: No stridor.  Nontender with no meningismus Cardiovascular: Normal rate, regular rhythm. Grossly normal heart sounds.  Good peripheral circulation. Respiratory: Normal respiratory effort.  No retractions. Lungs CTAB. Abdominal: Soft and nontender. No distention. No guarding no rebound Back:  There is no focal tenderness or step off there is no midline tenderness there are no lesions noted. there is no CVA tenderness Musculoskeletal: No lower extremity tenderness. No joint effusions, no DVT signs strong distal pulses no edema Neurologic:  Normal speech and language. No gross focal neurologic deficits are appreciated. Positive for nystagmus cranial nerves otherwise intact with no focal neurologic deficit noted Skin:  Skin is warm, dry and intact. No rash noted. Psychiatric: Mood and affect are normal. Speech and behavior are normal.  ____________________________________________   LABS (all labs ordered are listed, but only abnormal results are displayed)  Labs Reviewed  CBC - Abnormal; Notable for the following:    RDW 15.2 (*)    All other components within normal limits  COMPREHENSIVE METABOLIC PANEL - Abnormal; Notable for the following:    Glucose, Bld 107 (*)    All other components within normal limits   ____________________________________________  EKG  I personally interpreted any EKGs ordered by me or triage Sinus pericardia rate 54 bpm no acute ST elevation or acute ST depression is visiting ST changes noted. ____________________________________________  [id number]  I reviewed any imaging ordered by me or triage that were performed during my shift ____________________________________________   PROCEDURES  Procedure(s) performed: None  Critical Care performed:  None  ____________________________________________   INITIAL IMPRESSION / ASSESSMENT AND PLAN / ED COURSE  Pertinent labs & imaging results that were available during my care of the patient were reviewed by me and considered in my medical decision making (see chart for details).  Patient with true vertigo, given meclizine and the waiting room and is starting to feel better. She is not actively vomiting at this time we'll give her Zofran. After she feels somewhat better try repositioning. No evidence of CVA and this is a chronic recurrent problem for the patient.  ----------------------------------------- 3:14 PM on 04/30/2015 -----------------------------------------  Patient requested Epley maneuver is that usually works for her. We did try it after assuring that there was no carotid bruit. Patient stated that she is feeling better but she wants to try IV Valium because she does not want to go home and feel worse. She still has some vertigo and nausea. remains neurologically intact.  ----------------------------------------- 4:24 PM on 04/30/2015 -----------------------------------------  Patient remains neurologically intact feels much better after Valium is eager to go home return precautions and follow-up given and understood ----------------------------------------- 5:43 PM on 04/30/2015 -----------------------------------------  Patient feeling much better, ambulatory, still with a slight amount of dizziness every once in a while she states. I have offered her admission and she declines. Would prefer also not to have a CT scan she states. I think that is not unreasonable but she understands that any time she can return to the emergency room for admission or further evaluation as needed. I have sent her family home to get the meclizine prescription which usually works for her and they have already filled the prescription and they are eager to  leave. ____________________________________________   FINAL CLINICAL IMPRESSION(S) / ED DIAGNOSES  Final diagnoses:  None     Schuyler Amor, MD 04/30/15 [place], MD 04/30/15 [place], MD 04/30/15 [place], MD 04/30/15 1743

## 2015-04-30 NOTE — ED Notes (Signed)
Called pt in waiting room, no answer

## 2015-05-14 ENCOUNTER — Ambulatory Visit: Payer: Medicare HMO | Attending: Orthopedic Surgery | Admitting: Physical Therapy

## 2015-05-14 ENCOUNTER — Ambulatory Visit: Payer: Medicare HMO | Admitting: Physical Therapy

## 2015-05-14 ENCOUNTER — Encounter: Payer: Self-pay | Admitting: Physical Therapy

## 2015-05-14 DIAGNOSIS — M25562 Pain in left knee: Secondary | ICD-10-CM | POA: Insufficient documentation

## 2015-05-14 DIAGNOSIS — M25551 Pain in right hip: Secondary | ICD-10-CM | POA: Insufficient documentation

## 2015-05-14 DIAGNOSIS — R269 Unspecified abnormalities of gait and mobility: Secondary | ICD-10-CM

## 2015-05-14 DIAGNOSIS — M6281 Muscle weakness (generalized): Secondary | ICD-10-CM | POA: Insufficient documentation

## 2015-05-14 DIAGNOSIS — R262 Difficulty in walking, not elsewhere classified: Secondary | ICD-10-CM | POA: Insufficient documentation

## 2015-05-14 DIAGNOSIS — M25662 Stiffness of left knee, not elsewhere classified: Secondary | ICD-10-CM | POA: Insufficient documentation

## 2015-05-14 DIAGNOSIS — M25651 Stiffness of right hip, not elsewhere classified: Secondary | ICD-10-CM | POA: Insufficient documentation

## 2015-05-14 NOTE — Therapy (Signed)
[facility] [address] [place], [place], [address] Phone: [phone]   Fax:  [phone]  Physical Therapy Evaluation  Patient Details  Name: [name] [name] MRN: [id number] Date of Birth: [birth year] No Data Recorded  Encounter Date: 05/14/2015      PT End of Session - 05/14/15 1703    Visit Number 1   Number of Visits 12   Date for PT Re-Evaluation 06/13/15   Authorization - Visit Number 1   Authorization - Number of Visits 10   PT Start Time [id number]   PT Stop Time 1540   PT Time Calculation (min) 42 min   Activity Tolerance Patient limited by pain   Behavior During Therapy [facility] for tasks assessed/performed      Past Medical History  Diagnosis Date  . Hypertension   . Arthritis   . Vertigo     hx post op  . Complication of anesthesia     terrible vertigo  . PONV (postoperative nausea and vomiting)     related to vertigo  . Shortness of breath     WITH EXERTION    Past Surgical History  Procedure Laterality Date  . Back surgery  2010    cervical fusion  . Foot surgery Left     heel---STATES METAL IN THE HEEL - FOOT TURNS OUTWARD  . Carpal tunnel release Right   . Breast surgery Left     biopsy  . Total knee revision Left 12/07/2012    Procedure:  TOTAL KNEE ARTHROPLASTY REVISION;  Surgeon: Gearlean Alf, MD;  Location: [facility];  Service: Orthopedics;  Laterality: Left;  . Knee closed reduction Left 02/15/2013    Procedure: CLOSED MANIPULATION LEFT KNEE;  Surgeon: Gearlean Alf, MD;  Location: [facility];  Service: Orthopedics;  Laterality: Left;  . Heel spur surgery    . Joint replacement Left 2013    knee  . Knee arthrotomy Left 05/29/2013    Procedure: LEFT KNEE ARTHROTOMY WITH SCAR EXCISION;  Surgeon: Gearlean Alf, MD;  Location: [facility];  Service: Orthopedics;  Laterality: Left;  . Knee arthrotomy Left 03/05/2014    Procedure: LEFT KNEE ARTHROTOMY/SCAR EXCISION/POLYETHYLENE REVISION;  Surgeon: Gearlean Alf, MD;  Location: [facility];  Service: Orthopedics;  Laterality: Left;    There were no vitals filed for this visit.  Visit Diagnosis:  Difficulty walking  Pain in right hip  Stiffness of hip joint, right  Abnormality of gait      Subjective Assessment - 05/14/15 1504    Subjective Pt reports R hip buckling while walking. Pt reports pain in R hip/groin/pelvis/butt. Pt reports the buckling and pain starting two weeks ago.    Limitations Walking;House hold activities;Lifting   How long can you stand comfortably? as soon as standing up   How long can you walk comfortably? as soon as starts    Diagnostic tests XRAYS   Patient Stated Goals return to golf/walk without pain/sleep without R hip waking up/"normal" stage of activity/prior level of function.    Currently in Pain? Yes   Pain Score 6    Pain Location Hip   Pain Orientation Right   Pain Descriptors / Indicators Constant   Pain Type Acute pain   Pain Onset 1 to 4 weeks ago   Pain Frequency Constant       OBJECTIVE:  MMT: L hip flexion 3/5, R hip flexion 4+/5; knee extension L 5/5 in available range, R: 5/5  knee flexion L 5/5 in available range, R: 5/5. R hip abduction: 3/5. L hip abduction 3/5.  R hip pain with bridging  R leg length: 96 cm L leg length: 96 cm with bent knee  R umbilical [id number] cm , L umbilical 98 cm  Palpation: pain with R hip AP mobilization, R lateral hip pain with motion. Grabbing with piriformis, hip abduction stretching  LEFS:  AROM:  L hip flexion: 115 deg in supine  R hip flexion:  85 deg in supine with pain at end range   L hip abduction: 29 deg in supine with pain in L knee at end range  R hip abduction:  23 deg in supine with groin pain at end range IR/ER: L: 27 deg in sitting/25 deg in sitting; R 28 deg in sitting/29 in sitting          PT Education - 05/14/15 1701    Education provided Yes   Education Details TFL stretch in supine, Hip flexor stretch, Clam shells, piriformis  stretch   Person(s) Educated Patient   Methods Explanation;Demonstration;Handout   Comprehension Verbalized understanding             PT Long Term Goals - 05/14/15 1728    PT LONG TERM GOAL #1   Title Pt will increase R hip abduction by 1/2 MMT grade in order to return to golf.   Baseline R hip abduction is 3/5 and currently unable to golf   Time 4   Period Weeks   Status New   PT LONG TERM GOAL #2   Title Pt will increase R hip flexion AROM to WNL of L hip flexion in order increase functional mobility   Baseline R hip flexion: 85 deg in supine with pain at end range   Time 4   Period Weeks   Status New   PT LONG TERM GOAL #3   Title Pt will improve LEFS score to > 40/80 in order to report decreased pain with sleeping on R side   Baseline LEFS: 11/80 (05/14/15)   Time 4   Period Weeks   Status New   PT LONG TERM GOAL #4   Title Pt will demonstrate an increase in activity tolerance to ambulate with LRD for 20 minutes with no reports of pain >2/10 in order to return to prior level of function   Baseline Pain: 7/10 at rest and pain with standing and walking immeidiately   Time 4   Period Weeks   Status New               Plan - 05/14/15 1707    Clinical Impression Statement Pt is a 65 y/o female referred to PT secondary to R hip pain. Pt rates pain at a 7/10. Pt scored an 11/80 on LEFS. MMT: L hip flexion 3/5, R hip flexion 4+/5; knee extension L 5/5 in available range, R: 5/5  knee flexion L 5/5 in available range, R: 5/5. R hip abduction: 3/5. L hip abduction 3/5. AROM: L hip flexion: 115 deg in supine. R hip flexion:  85 deg in supine with pain at end range. L hip abduction: 29 deg in supine with pain in L knee at end range.  R hip abduction:  23 deg in supine with groin pain at end range. IR/ER: L: 27 deg in sitting/25 deg in sitting; R 28 deg in sitting/29 in sitting.  Pt benefits from short term skilled PT to address functional mobility impairments and return to prior  level of function.   Pt will benefit from skilled therapeutic intervention in order to improve on the following deficits Abnormal gait;Decreased activity tolerance;Decreased balance;Decreased endurance;Decreased range of motion;Difficulty walking;Hypomobility;Decreased strength;Pain;Improper body mechanics;Decreased mobility   Rehab Potential Fair   PT Frequency 2x / week   PT Duration 4 weeks   PT Treatment/Interventions ADLs/Self Care Home Management;Gait training;Functional mobility training;Therapeutic exercise;Balance training;Therapeutic activities;Neuromuscular re-education;Patient/family education;Manual techniques;Passive range of motion;Cryotherapy;Electrical Stimulation;Moist Heat   PT Next Visit Plan strengthening and gait training. Discuss shoe build up.   PT Home Exercise Plan see above   Consulted and Agree with Plan of Care Patient         Problem List Patient Active Problem List   Diagnosis Date Noted  . Arthrofibrosis of knee joint 03/05/2014  . Hypokalemia 05/30/2013  . OA (osteoarthritis) of knee 05/29/2013  . Postoperative stiffness of total knee replacement ([place]) 02/15/2013  . Postoperative anemia due to acute blood loss 12/09/2012  . 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 [place], [place], [address] Phone: [phone]   Fax:  [phone]  Name: [name] [name] MRN: [id number] Date of Birth: October 16, 1950

## 2015-05-16 ENCOUNTER — Ambulatory Visit: Payer: Medicare HMO | Admitting: Physical Therapy

## 2015-05-16 ENCOUNTER — Encounter: Payer: Self-pay | Admitting: Physical Therapy

## 2015-05-16 DIAGNOSIS — R262 Difficulty in walking, not elsewhere classified: Secondary | ICD-10-CM

## 2015-05-16 DIAGNOSIS — M25651 Stiffness of right hip, not elsewhere classified: Secondary | ICD-10-CM

## 2015-05-16 DIAGNOSIS — M25551 Pain in right hip: Secondary | ICD-10-CM

## 2015-05-16 DIAGNOSIS — M25562 Pain in left knee: Secondary | ICD-10-CM

## 2015-05-16 DIAGNOSIS — M25662 Stiffness of left knee, not elsewhere classified: Secondary | ICD-10-CM

## 2015-05-16 DIAGNOSIS — R269 Unspecified abnormalities of gait and mobility: Secondary | ICD-10-CM

## 2015-05-16 DIAGNOSIS — M6281 Muscle weakness (generalized): Secondary | ICD-10-CM

## 2015-05-17 NOTE — Therapy (Addendum)
[facility] [address] [place], [place], [address] Phone: [phone]   Fax:  [phone]  Physical Therapy Treatment  Patient Details  Name: [name] [name] MRN: [id number] Date of Birth: 28-Jun-1950 No Data Recorded  Encounter Date: 05/16/2015    Past Medical History  Diagnosis Date  . Hypertension   . Arthritis   . Vertigo     hx post op  . Complication of anesthesia     terrible vertigo  . PONV (postoperative nausea and vomiting)     related to vertigo  . Shortness of breath     WITH EXERTION    Past Surgical History  Procedure Laterality Date  . Back surgery  2010    cervical fusion  . Foot surgery Left     heel---STATES METAL IN THE HEEL - FOOT TURNS OUTWARD  . Carpal tunnel release Right   . Breast surgery Left     biopsy  . Total knee revision Left 12/07/2012    Procedure:  TOTAL KNEE ARTHROPLASTY REVISION;  Surgeon: Gearlean Alf, MD;  [facility];  Service: Orthopedics;  Laterality: Left;  . Knee closed reduction Left 02/15/2013    Procedure: CLOSED MANIPULATION LEFT KNEE;  Surgeon: Gearlean Alf, MD;  [facility]: [facility];  Service: Orthopedics;  Laterality: Left;  . Heel spur surgery    . Joint replacement Left 2013    knee  . Knee arthrotomy Left 05/29/2013    Procedure: LEFT KNEE ARTHROTOMY WITH SCAR EXCISION;  Surgeon: Gearlean Alf, MD;  [facility];  Service: Orthopedics;  Laterality: Left;  . Knee arthrotomy Left 03/05/2014    Procedure: LEFT KNEE ARTHROTOMY/SCAR EXCISION/POLYETHYLENE REVISION;  Surgeon: Gearlean Alf, MD;  [facility];  Service: Orthopedics;  Laterality: Left;    There were no vitals filed for this visit.  Visit Diagnosis:  Pain in right hip  Stiffness of hip joint, right  Abnormality of gait  Difficulty walking  Pain in left knee  Muscle weakness  Joint stiffness of knee, left      Subjective Assessment - 05/17/15 1615    Subjective Pt. reports  being really stiff in knee and hip pain persists.     Limitations Walking;House hold activities;Lifting   How long can you stand comfortably? as soon as standing up   How long can you walk comfortably? as soon as starts    Diagnostic tests XRAYS   Patient Stated Goals return to golf/walk without pain/sleep without R hip waking up/"normal" stage of activity/prior level of function.    Currently in Pain? Yes   Pain Score 6    Pain Location Hip   Pain Orientation Right        OBJECTIVE:  There.ex.: Reviewed HEP/ stretching program (see handouts).  Supine clamshells with assist and light manual resistance 20x.  Nustep L7 10 min. B UE/LE.  Manual stretches: supine R hip stretches (piriformis/ rotn./ hamstring/ hip flexor)- 5x each.  L knee flexion/ext. AA/PROM 5x.     Pt response for medical necessity:  Pain limited R hip stretches with increase c/o groin pain/ pinching.  Significant LLD in standing due to L knee ext. Limitations.  (+) R hip tenderness with palpation during manual tx.     R hip tenderness with light palpation/ manual stretches to hip/ bursae region in L sidelying position.  R hip flexor/ groin "pinching" and discomfort with piriformis/ glut./ ITB stretches in supine and sidelying positions.  PT Long Term Goals - 05/14/15 1728    PT LONG TERM GOAL #1   Title Pt will increase R hip abduction by 1/2 MMT grade in order to return to golf.   Baseline R hip abduction is 3/5 and currently unable to golf   Time 4   Period Weeks   Status New   PT LONG TERM GOAL #2   Title Pt will increase R hip flexion AROM to WNL of L hip flexion in order increase functional mobility   Baseline R hip flexion: 85 deg in supine with pain at end range   Time 4   Period Weeks   Status New   PT LONG TERM GOAL #3   Title Pt will improve LEFS score to > 40/80 in order to report decreased pain with sleeping on R side   Baseline LEFS: 11/80 (05/14/15)   Time 4   Period Weeks   Status  New   PT LONG TERM GOAL #4   Title Pt will demonstrate an increase in activity tolerance to ambulate with LRD for 20 minutes with no reports of pain >2/10 in order to return to prior level of function   Baseline Pain: 7/10 at rest and pain with standing and walking immeidiately   Time 4   Period Weeks   Status New       Problem List Patient Active Problem List   Diagnosis Date Noted  . Arthrofibrosis of knee joint 03/05/2014  . Hypokalemia 05/30/2013  . OA (osteoarthritis) of knee 05/29/2013  . Postoperative stiffness of total knee replacement ([place]) 02/15/2013  . Postoperative anemia due to acute blood loss 12/09/2012  . 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

## 2015-05-20 ENCOUNTER — Ambulatory Visit: Payer: Medicare HMO | Admitting: Physical Therapy

## 2015-05-22 ENCOUNTER — Ambulatory Visit: Payer: Medicare HMO | Admitting: Physical Therapy

## 2015-05-22 DIAGNOSIS — M6281 Muscle weakness (generalized): Secondary | ICD-10-CM

## 2015-05-22 DIAGNOSIS — R262 Difficulty in walking, not elsewhere classified: Secondary | ICD-10-CM | POA: Diagnosis not present

## 2015-05-22 DIAGNOSIS — R269 Unspecified abnormalities of gait and mobility: Secondary | ICD-10-CM

## 2015-05-22 DIAGNOSIS — M25551 Pain in right hip: Secondary | ICD-10-CM

## 2015-05-22 DIAGNOSIS — M25651 Stiffness of right hip, not elsewhere classified: Secondary | ICD-10-CM

## 2015-05-22 DIAGNOSIS — M25562 Pain in left knee: Secondary | ICD-10-CM

## 2015-05-23 ENCOUNTER — Encounter: Payer: Self-pay | Admitting: Physical Therapy

## 2015-05-23 NOTE — Therapy (Signed)
[place] [facility] [address] [place], [place], [address] Phone: [phone]   Fax:  [phone]  Physical Therapy Treatment  Patient Details  Name: [name] [name] MRN: [id number] Date of Birth: [birth year] No Data Recorded  Encounter Date: 05/22/2015      PT End of Session - 05/23/15 1149    Visit Number 3   Number of Visits 12   Date for PT Re-Evaluation 06/11/15   Authorization - Visit Number 3   Authorization - Number of Visits 10   PT Start Time [id number]   PT Stop Time [id number]   PT Time Calculation (min) 53 min   Activity Tolerance Patient limited by pain;Patient tolerated treatment well   Behavior During Therapy [facility] for tasks assessed/performed      Past Medical History  Diagnosis Date  . Hypertension   . Arthritis   . Vertigo     hx post op  . Complication of anesthesia     terrible vertigo  . PONV (postoperative nausea and vomiting)     related to vertigo  . Shortness of breath     WITH EXERTION    Past Surgical History  Procedure Laterality Date  . Back surgery  2010    cervical fusion  . Foot surgery Left     heel---STATES METAL IN THE HEEL - FOOT TURNS OUTWARD  . Carpal tunnel release Right   . Breast surgery Left     biopsy  . Total knee revision Left 12/07/2012    Procedure:  TOTAL KNEE ARTHROPLASTY REVISION;  Surgeon: Gearlean Alf, MD;  [facility];  Service: Orthopedics;  Laterality: Left;  . Knee closed reduction Left 02/15/2013    Procedure: CLOSED MANIPULATION LEFT KNEE;  Surgeon: Gearlean Alf, MD;  Location: [facility];  Service: Orthopedics;  Laterality: Left;  . Heel spur surgery    . Joint replacement Left 2013    knee  . Knee arthrotomy Left 05/29/2013    Procedure: LEFT KNEE ARTHROTOMY WITH SCAR EXCISION;  Surgeon: Gearlean Alf, MD;  [facility]: [facility];  Service: Orthopedics;  Laterality: Left;  . Knee arthrotomy Left 03/05/2014    Procedure: LEFT KNEE ARTHROTOMY/SCAR EXCISION/POLYETHYLENE  REVISION;  Surgeon: Gearlean Alf, MD;  [facility]: [facility];  Service: Orthopedics;  Laterality: Left;    There were no vitals filed for this visit.  Visit Diagnosis:  Pain in right hip  Stiffness of hip joint, right  Abnormality of gait  Difficulty walking  Pain in left knee  Muscle weakness      Subjective Assessment - 05/23/15 1141    Subjective Pt. states she is staying active and trying to move R hip more.  Pt. entered PT with use of SPC and moderate antalgic gait pattern due to hip pain/ LLD.     Limitations Walking;House hold activities;Lifting   How long can you stand comfortably? as soon as standing up   How long can you walk comfortably? as soon as starts    Diagnostic tests XRAYS   Patient Stated Goals return to golf/walk without pain/sleep without R hip waking up/"normal" stage of activity/prior level of function.    Currently in Pain? Yes   Pain Score 5    Pain Location Hip   Pain Orientation Right   Pain Descriptors / Indicators Constant   Pain Type Chronic pain   Pain Onset 1 to 4 weeks ago       OBJECTIVE:  There.ex.:  Supine hip  clamshells with GTB 20x (assist for L knee flexion/ maintaining position).  Supine hamstring/ ITB self-stretches with verbal and tactile cuing 3x each.  Reviewed HEP and issued GTB.  Nustep L7 15 min. B LE (no charge/ warm-up).  Manual tx.: Supine B LE/ hip stretches (focusing on piriformis/ rotn./ ITB/ hip flexor/ glut.) 3x each (pain tolerable esp. On R hip).  Supine L hamstring/ PROM to knee to maintain knee ROM.  Ice to R hip in L sidelying after tx./ Nustep for 10 min.      Pt response for medical necessity:  Pt. Benefits from LE stretching/ flexibility to increase R hip mobility/ pain-free walking.  L knee ROM limitations/ LLD limits upright posture with standing/ gait.            PT Long Term Goals - 05/14/15 1728    PT LONG TERM GOAL #1   Title Pt will increase R hip abduction by 1/2 MMT grade in order to return to  golf.   Baseline R hip abduction is 3/5 and currently unable to golf   Time 4   Period Weeks   Status New   PT LONG TERM GOAL #2   Title Pt will increase R hip flexion AROM to WNL of L hip flexion in order increase functional mobility   Baseline R hip flexion: 85 deg in supine with pain at end range   Time 4   Period Weeks   Status New   PT LONG TERM GOAL #3   Title Pt will improve LEFS score to > 40/80 in order to report decreased pain with sleeping on R side   Baseline LEFS: 11/80 (05/14/15)   Time 4   Period Weeks   Status New   PT LONG TERM GOAL #4   Title Pt will demonstrate an increase in activity tolerance to ambulate with LRD for 20 minutes with no reports of pain >2/10 in order to return to prior level of function   Baseline Pain: 7/10 at rest and pain with standing and walking immeidiately   Time 4   Period Weeks   Status New            Plan - 05/23/15 1149    Clinical Impression Statement Increase R hip flexion noted today with decrease groin/hip pain.  Pt. able to go from sitting to standing from green chair/mat table with decrease c/o R hip pain with more efficient movement pattern.  Pt. remains limited by significant antalgic gait pattern due to LLD/ hip pain.  PT recommends continued use of SPC wtih all walking and advised pt. on heel lift to offset LLD.     Pt will benefit from skilled therapeutic intervention in order to improve on the following deficits Abnormal gait;Decreased activity tolerance;Decreased balance;Decreased endurance;Decreased range of motion;Difficulty walking;Hypomobility;Decreased strength;Pain;Improper body mechanics;Decreased mobility   Rehab Potential Fair   PT Frequency 2x / week   PT Duration 4 weeks   PT Treatment/Interventions ADLs/Self Care Home Management;Gait training;Functional mobility training;Therapeutic exercise;Balance training;Therapeutic activities;Neuromuscular re-education;Patient/family education;Manual techniques;Passive  range of motion;Cryotherapy;Electrical Stimulation;Moist Heat   PT Next Visit Plan strengthening and gait training.  Pain mgmt/ icing to R hip    PT Home Exercise Plan reviewed HEP   Consulted and Agree with Plan of Care Patient        Problem List Patient Active Problem List   Diagnosis Date Noted  . Arthrofibrosis of knee joint 03/05/2014  . Hypokalemia 05/30/2013  . OA (osteoarthritis) of knee 05/29/2013  . Postoperative  stiffness of total knee replacement ([place]) 02/15/2013  . Postoperative anemia due to acute blood loss 12/09/2012  . 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 [place], [place], [address] Phone: [phone]   Fax:  [phone]  Name: [name] [name] MRN: [id number] Date of Birth: 04-Jul-1950

## 2015-05-28 ENCOUNTER — Ambulatory Visit: Payer: Medicare HMO | Admitting: Physical Therapy

## 2015-05-28 DIAGNOSIS — R269 Unspecified abnormalities of gait and mobility: Secondary | ICD-10-CM

## 2015-05-28 DIAGNOSIS — R262 Difficulty in walking, not elsewhere classified: Secondary | ICD-10-CM

## 2015-05-28 DIAGNOSIS — M25562 Pain in left knee: Secondary | ICD-10-CM

## 2015-05-28 DIAGNOSIS — M6281 Muscle weakness (generalized): Secondary | ICD-10-CM

## 2015-05-28 DIAGNOSIS — M25551 Pain in right hip: Secondary | ICD-10-CM

## 2015-05-28 DIAGNOSIS — M25651 Stiffness of right hip, not elsewhere classified: Secondary | ICD-10-CM

## 2015-05-29 ENCOUNTER — Encounter: Payer: Self-pay | Admitting: Physical Therapy

## 2015-05-29 NOTE — Therapy (Signed)
[place] [facility] [address] [place], [place], [address] Phone: [phone]   Fax:  [phone]  Physical Therapy Treatment  Patient Details  Name: [name] [name] MRN: [id number] Date of Birth: Sep 10, 1950 No Data Recorded  Encounter Date: 05/28/2015      PT End of Session - 05/29/15 1340    Visit Number 4   Number of Visits 12   Date for PT Re-Evaluation 06/11/15   Authorization - Visit Number 4   Authorization - Number of Visits 10   PT Start Time [id number]   PT Stop Time 1600   PT Time Calculation (min) 54 min   Equipment Utilized During Treatment Other (comment)   Activity Tolerance Patient limited by pain;Patient tolerated treatment well   Behavior During Therapy [facility] for tasks assessed/performed      Past Medical History  Diagnosis Date  . Hypertension   . Arthritis   . Vertigo     hx post op  . Complication of anesthesia     terrible vertigo  . PONV (postoperative nausea and vomiting)     related to vertigo  . Shortness of breath     WITH EXERTION    Past Surgical History  Procedure Laterality Date  . Back surgery  2010    cervical fusion  . Foot surgery Left     heel---STATES METAL IN THE HEEL - FOOT TURNS OUTWARD  . Carpal tunnel release Right   . Breast surgery Left     biopsy  . Total knee revision Left 12/07/2012    Procedure:  TOTAL KNEE ARTHROPLASTY REVISION;  Surgeon: Gearlean Alf, MD;  Location: [facility];  Service: Orthopedics;  Laterality: Left;  . Knee closed reduction Left 02/15/2013    Procedure: CLOSED MANIPULATION LEFT KNEE;  Surgeon: Gearlean Alf, MD;  Location: [facility];  Service: Orthopedics;  Laterality: Left;  . Heel spur surgery    . Joint replacement Left 2013    knee  . Knee arthrotomy Left 05/29/2013    Procedure: LEFT KNEE ARTHROTOMY WITH SCAR EXCISION;  Surgeon: Gearlean Alf, MD;  Location: [facility];  Service: Orthopedics;  Laterality: Left;  . Knee arthrotomy Left 03/05/2014   Procedure: LEFT KNEE ARTHROTOMY/SCAR EXCISION/POLYETHYLENE REVISION;  Surgeon: Gearlean Alf, MD;  [facility]: [facility];  Service: Orthopedics;  Laterality: Left;    There were no vitals filed for this visit.  Visit Diagnosis:  Pain in right hip  Stiffness of hip joint, right  Abnormality of gait  Difficulty walking  Pain in left knee  Muscle weakness      Subjective Assessment - 05/29/15 1339    Subjective Pt. states she is doing a little better but continues to be limited by R hip/ L knee stiffness.    Limitations Walking;House hold activities;Lifting   How long can you stand comfortably? as soon as standing up   How long can you walk comfortably? as soon as starts    Diagnostic tests XRAYS   Patient Stated Goals return to golf/walk without pain/sleep without R hip waking up/"normal" stage of activity/prior level of function.    Currently in Pain? Yes   Pain Score 4    Pain Location Hip   Pain Orientation Right   Pain Descriptors / Indicators Constant   Pain Type Chronic pain   Pain Onset 1 to 4 weeks ago         OBJECTIVE: There.ex.: Supine hamstring/ ITB self-stretches with verbal and tactile cuing  3x each.  Sit to stands from mat table 5x.  Walking in clinic with cuing on hip/knee flexion to promote consistent step pattern with use of SPC.  Standing hip ex. In //-bars (6 min.). Nustep L7 15 min. B LE (no charge/ warm-up). Manual tx.: Supine B LE/ hip stretches (focusing on piriformis/ rotn./ ITB/ hip flexor/ glut.) 3x each (pain tolerable esp. On R hip).  Prone grade II-III PA mobs. To R hip 3x30 sec. STM to R hip in supine/ L sidelying position.  Sidelying hip flexor/ ITB stretches 2x each. Ice to R hip in supine position after tx.  Issued 2 more heel lifts from pt. To use on L shoes at home.     Pt response for medical necessity: Pt. Benefits from LE stretching/ flexibility to increase R hip mobility/ pain-free walking. L knee ROM limitations/ LLD  limits upright posture with standing/ gait.          PT Long Term Goals - 05/14/15 1728    PT LONG TERM GOAL #1   Title Pt will increase R hip abduction by 1/2 MMT grade in order to return to golf.   Baseline R hip abduction is 3/5 and currently unable to golf   Time 4   Period Weeks   Status New   PT LONG TERM GOAL #2   Title Pt will increase R hip flexion AROM to WNL of L hip flexion in order increase functional mobility   Baseline R hip flexion: 85 deg in supine with pain at end range   Time 4   Period Weeks   Status New   PT LONG TERM GOAL #3   Title Pt will improve LEFS score to > 40/80 in order to report decreased pain with sleeping on R side   Baseline LEFS: 11/80 (05/14/15)   Time 4   Period Weeks   Status New   PT LONG TERM GOAL #4   Title Pt will demonstrate an increase in activity tolerance to ambulate with LRD for 20 minutes with no reports of pain >2/10 in order to return to prior level of function   Baseline Pain: 7/10 at rest and pain with standing and walking immeidiately   Time 4   Period Weeks   Status New               Plan - 05/29/15 1341    Clinical Impression Statement R hip/ groin stiffness and discomfort noted during supine stretches.  Pt. has difficulty with initial steps after going from supine or sitting to standing posture due to L knee/R hip limitations.  Overall, pt. has c/o less R hip tenderness/ pain with manual tx./ functional tasks.     Pt will benefit from skilled therapeutic intervention in order to improve on the following deficits Abnormal gait;Decreased activity tolerance;Decreased balance;Decreased endurance;Decreased range of motion;Difficulty walking;Hypomobility;Decreased strength;Pain;Improper body mechanics;Decreased mobility   Rehab Potential Fair   PT Frequency 2x / week   PT Duration 4 weeks   PT Treatment/Interventions ADLs/Self Care Home Management;Gait training;Functional mobility training;Therapeutic exercise;Balance  training;Therapeutic activities;Neuromuscular re-education;Patient/family education;Manual techniques;Passive range of motion;Cryotherapy;Electrical Stimulation;Moist Heat   PT Next Visit Plan strengthening and gait training.  Pain mgmt/ icing to R hip    PT Home Exercise Plan reviewed HEP   Consulted and Agree with Plan of Care Patient        Problem List Patient Active Problem List   Diagnosis Date Noted  . Arthrofibrosis of knee joint 03/05/2014  . Hypokalemia 05/30/2013  .  OA (osteoarthritis) of knee 05/29/2013  . Postoperative stiffness of total knee replacement ([place]) 02/15/2013  . Postoperative anemia due to acute blood loss 12/09/2012  . 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 [place], [place], [address] Phone: [phone]   Fax:  [phone]  Name: [name] [name] MRN: [id number] Date of Birth: March 16, 1951

## 2015-05-30 ENCOUNTER — Ambulatory Visit: Payer: Medicare HMO | Admitting: Physical Therapy

## 2015-05-30 DIAGNOSIS — M25651 Stiffness of right hip, not elsewhere classified: Secondary | ICD-10-CM

## 2015-05-30 DIAGNOSIS — R262 Difficulty in walking, not elsewhere classified: Secondary | ICD-10-CM | POA: Diagnosis not present

## 2015-05-30 DIAGNOSIS — M25562 Pain in left knee: Secondary | ICD-10-CM

## 2015-05-30 DIAGNOSIS — M25551 Pain in right hip: Secondary | ICD-10-CM

## 2015-05-30 DIAGNOSIS — M6281 Muscle weakness (generalized): Secondary | ICD-10-CM

## 2015-05-30 DIAGNOSIS — R269 Unspecified abnormalities of gait and mobility: Secondary | ICD-10-CM

## 2015-05-31 NOTE — Therapy (Signed)
[place] [facility] [address] [place], [place], [address] Phone: [phone]   Fax:  [phone]  Physical Therapy Treatment  Patient Details  Name: [name] [name] MRN: [id number] Date of Birth: [birth year] No Data Recorded  Encounter Date: 05/30/2015      PT End of Session - 05/31/15 1609    Visit Number 5   Number of Visits 12   Date for PT Re-Evaluation 06/11/15   Authorization - Visit Number 5   Authorization - Number of Visits 10   PT Start Time [id number]   PT Stop Time 1526   PT Time Calculation (min) 51 min   Activity Tolerance Patient limited by pain;Patient tolerated treatment well   Behavior During Therapy [facility] for tasks assessed/performed      Past Medical History  Diagnosis Date  . Hypertension   . Arthritis   . Vertigo     hx post op  . Complication of anesthesia     terrible vertigo  . PONV (postoperative nausea and vomiting)     related to vertigo  . Shortness of breath     WITH EXERTION    Past Surgical History  Procedure Laterality Date  . Back surgery  2010    cervical fusion  . Foot surgery Left     heel---STATES METAL IN THE HEEL - FOOT TURNS OUTWARD  . Carpal tunnel release Right   . Breast surgery Left     biopsy  . Total knee revision Left 12/07/2012    Procedure:  TOTAL KNEE ARTHROPLASTY REVISION;  Surgeon: Gearlean Alf, MD;  [facility];  Service: Orthopedics;  Laterality: Left;  . Knee closed reduction Left 02/15/2013    Procedure: CLOSED MANIPULATION LEFT KNEE;  Surgeon: Gearlean Alf, MD;  [facility]: [facility];  Service: Orthopedics;  Laterality: Left;  . Heel spur surgery    . Joint replacement Left 2013    knee  . Knee arthrotomy Left 05/29/2013    Procedure: LEFT KNEE ARTHROTOMY WITH SCAR EXCISION;  Surgeon: Gearlean Alf, MD;  [facility];  Service: Orthopedics;  Laterality: Left;  . Knee arthrotomy Left 03/05/2014    Procedure: LEFT KNEE ARTHROTOMY/SCAR EXCISION/POLYETHYLENE  REVISION;  Surgeon: Gearlean Alf, MD;  [facility];  Service: Orthopedics;  Laterality: Left;    There were no vitals filed for this visit.  Visit Diagnosis:  Pain in right hip  Stiffness of hip joint, right  Abnormality of gait  Difficulty walking  Pain in left knee  Muscle weakness      Subjective Assessment - 05/31/15 1603    Subjective Pt. states she continues to have R hip stiffness and chronic L knee joint stiffness/ weakness.  No new complaints.     Limitations Walking;House hold activities;Lifting   How long can you stand comfortably? as soon as standing up   How long can you walk comfortably? as soon as starts    Diagnostic tests XRAYS   Patient Stated Goals return to golf/walk without pain/sleep without R hip waking up/"normal" stage of activity/prior level of function.    Currently in Pain? Yes   Pain Score 5    Pain Location Hip   Pain Orientation Right   Pain Descriptors / Indicators Constant   Pain Onset 1 to 4 weeks ago      OBJECTIVE: There.ex.:   Nustep L7 15 min. B LE (no charge/ warm-up). Supine hamstring/ ITB self-stretches with verbal and tactile cuing 3x each.  Standing hip abd./ ext. 10x2 with focus on posture. Walking in clinic with cuing on hip/knee flexion to promote consistent step pattern with use of SPC. Reviewed HEP. Manual tx.: Supine B LE/ hip stretches (focusing on piriformis/ rotn./ ITB/ hip flexor/ glut.) 3x each (pain tolerable esp. On R hip). Prone grade II-III PA mobs. To R hip 3x30 sec. STM to R hip in supine/ L sidelying position. Sidelying hip flexor/ ITB stretches 2x each. Tx. Consistent of alternating between supine stretches/ there.ex. To avoid joint stiffness t/o tx.  Pt. Ambulated with improved gait pattern as compared to end of last tx. Session.      Pt response for medical necessity: Pt. Benefits from LE stretching/ flexibility to increase R hip mobility/ pain-free walking. L knee ROM limitations/  LLD limits upright posture with standing/ gait.        PT Long Term Goals - 05/14/15 1728    PT LONG TERM GOAL #1   Title Pt will increase R hip abduction by 1/2 MMT grade in order to return to golf.   Baseline R hip abduction is 3/5 and currently unable to golf   Time 4   Period Weeks   Status New   PT LONG TERM GOAL #2   Title Pt will increase R hip flexion AROM to WNL of L hip flexion in order increase functional mobility   Baseline R hip flexion: 85 deg in supine with pain at end range   Time 4   Period Weeks   Status New   PT LONG TERM GOAL #3   Title Pt will improve LEFS score to > 40/80 in order to report decreased pain with sleeping on R side   Baseline LEFS: 11/80 (05/14/15)   Time 4   Period Weeks   Status New   PT LONG TERM GOAL #4   Title Pt will demonstrate an increase in activity tolerance to ambulate with LRD for 20 minutes with no reports of pain >2/10 in order to return to prior level of function   Baseline Pain: 7/10 at rest and pain with standing and walking immeidiately   Time 4   Period Weeks   Status New            Plan - 05/31/15 1610    Clinical Impression Statement Increaes R hip/piriformis stretch in supine today but remains limited with capsule hypomobility/ significant L knee joint limitations.  Pt. requires extra time to stand from chair and ambulate intial steps due to stiffness/ discomfort.  No LOB but requires use of SPC with all standing/walking tasks.  Pt. has been using heel lifts to slight offset LLD with upright tasks.     Pt will benefit from skilled therapeutic intervention in order to improve on the following deficits Abnormal gait;Decreased activity tolerance;Decreased balance;Decreased endurance;Decreased range of motion;Difficulty walking;Hypomobility;Decreased strength;Pain;Improper body mechanics;Decreased mobility   Rehab Potential Fair   PT Frequency 2x / week   PT Duration 4 weeks   PT Treatment/Interventions ADLs/Self Care  Home Management;Gait training;Functional mobility training;Therapeutic exercise;Balance training;Therapeutic activities;Neuromuscular re-education;Patient/family education;Manual techniques;Passive range of motion;Cryotherapy;Electrical Stimulation;Moist Heat   PT Next Visit Plan strengthening and gait training.  Pain mgmt/ icing to R hip    PT Home Exercise Plan reviewed HEP   Consulted and Agree with Plan of Care Patient        Problem List Patient Active Problem List   Diagnosis Date Noted  . Arthrofibrosis of knee joint 03/05/2014  . Hypokalemia 05/30/2013  . OA (osteoarthritis)  of knee 05/29/2013  . Postoperative stiffness of total knee replacement ([place]) 02/15/2013  . Postoperative anemia due to acute blood loss 12/09/2012  . 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 [place], [place], [address] Phone: [phone]   Fax:  [phone]  Name: [name] [name] MRN: [id number] Date of Birth: 10-29-1950

## 2015-06-04 ENCOUNTER — Encounter: Payer: Self-pay | Admitting: Physical Therapy

## 2015-06-04 ENCOUNTER — Ambulatory Visit: Payer: Medicare HMO | Admitting: Physical Therapy

## 2015-06-04 DIAGNOSIS — M25662 Stiffness of left knee, not elsewhere classified: Secondary | ICD-10-CM

## 2015-06-04 DIAGNOSIS — M25651 Stiffness of right hip, not elsewhere classified: Secondary | ICD-10-CM

## 2015-06-04 DIAGNOSIS — M6281 Muscle weakness (generalized): Secondary | ICD-10-CM

## 2015-06-04 DIAGNOSIS — R269 Unspecified abnormalities of gait and mobility: Secondary | ICD-10-CM

## 2015-06-04 DIAGNOSIS — R262 Difficulty in walking, not elsewhere classified: Secondary | ICD-10-CM | POA: Diagnosis not present

## 2015-06-04 DIAGNOSIS — M25551 Pain in right hip: Secondary | ICD-10-CM

## 2015-06-04 DIAGNOSIS — M25562 Pain in left knee: Secondary | ICD-10-CM

## 2015-06-04 NOTE — Therapy (Signed)
[facility] [address] [place], [place], [address] Phone: [phone]   Fax:  [phone]  Physical Therapy Treatment  Patient Details  Name: [name] [name] MRN: [id number] Date of Birth: November 20, 1950 No Data Recorded  Encounter Date: 06/04/2015      PT End of Session - 06/04/15 1655    Visit Number 6   Number of Visits 12   Date for PT Re-Evaluation 06/11/15   Authorization - Visit Number 6   Authorization - Number of Visits 10   PT Start Time [id number]   PT Stop Time [id number]   PT Time Calculation (min) 48 min   Activity Tolerance Patient tolerated treatment well;No increased pain   Behavior During Therapy [facility] for tasks assessed/performed      Past Medical History  Diagnosis Date  . Hypertension   . Arthritis   . Vertigo     hx post op  . Complication of anesthesia     terrible vertigo  . PONV (postoperative nausea and vomiting)     related to vertigo  . Shortness of breath     WITH EXERTION    Past Surgical History  Procedure Laterality Date  . Back surgery  2010    cervical fusion  . Foot surgery Left     heel---STATES METAL IN THE HEEL - FOOT TURNS OUTWARD  . Carpal tunnel release Right   . Breast surgery Left     biopsy  . Total knee revision Left 12/07/2012    Procedure:  TOTAL KNEE ARTHROPLASTY REVISION;  Surgeon: Gearlean Alf, MD;  [facility];  Service: Orthopedics;  Laterality: Left;  . Knee closed reduction Left 02/15/2013    Procedure: CLOSED MANIPULATION LEFT KNEE;  Surgeon: Gearlean Alf, MD;  [facility]: [facility];  Service: Orthopedics;  Laterality: Left;  . Heel spur surgery    . Joint replacement Left 2013    knee  . Knee arthrotomy Left 05/29/2013    Procedure: LEFT KNEE ARTHROTOMY WITH SCAR EXCISION;  Surgeon: Gearlean Alf, MD;  [facility];  Service: Orthopedics;  Laterality: Left;  . Knee arthrotomy Left 03/05/2014    Procedure: LEFT KNEE ARTHROTOMY/SCAR EXCISION/POLYETHYLENE  REVISION;  Surgeon: Gearlean Alf, MD;  [facility];  Service: Orthopedics;  Laterality: Left;    There were no vitals filed for this visit.  Visit Diagnosis:  Pain in right hip  Stiffness of hip joint, right  Abnormality of gait  Difficulty walking  Pain in left knee  Muscle weakness  Joint stiffness of knee, left      Subjective Assessment - 06/04/15 1650    Subjective Pt reports less R hip pain after receiving injection on Friday 1/27.  Pt continues to compain of R groin pain. Pt reports she is having  L foot pain that comes on after sitting for long periods of time.    Limitations Walking;House hold activities;Lifting   How long can you stand comfortably? as soon as standing up   How long can you walk comfortably? as soon as starts    Diagnostic tests XRAYS   Patient Stated Goals return to golf/walk without pain/sleep without R hip waking up/"normal" stage of activity/prior level of function.    Currently in Pain? No/denies   Pain Onset 1 to 4 weeks ago       Objectives: Nustep: 15 mins (no charge/warm up)- no increase c/o pain in R hip.  Discussed HEP.   Manual: Piriformis stretch  x 4 for 30 seconds. Proximal and distal hamstring stretch x 2 for 30 seconds. TFL stretch x 4 for 30 seconds.  Seated L knee PROM flexion/ extension stretches 4x each. Seated L/R gastroc manual stretches 3x.  Seated/supine Thomas stretches (R hip flexor tightness noted as compared to R).    Pt response to treatment for medical necessity: Pt will benefit from skilled PT to increase flexibility and mobility in order to improve pain free ambulation in community. Pt will benefit from strengthening in order to improve functional mobility.          PT Long Term Goals - 05/14/15 1728    PT LONG TERM GOAL #1   Title Pt will increase R hip abduction by 1/2 MMT grade in order to return to golf.   Baseline R hip abduction is 3/5 and currently unable to golf   Time 4   Period Weeks   Status  New   PT LONG TERM GOAL #2   Title Pt will increase R hip flexion AROM to WNL of L hip flexion in order increase functional mobility   Baseline R hip flexion: 85 deg in supine with pain at end range   Time 4   Period Weeks   Status New   PT LONG TERM GOAL #3   Title Pt will improve LEFS score to > 40/80 in order to report decreased pain with sleeping on R side   Baseline LEFS: 11/80 (05/14/15)   Time 4   Period Weeks   Status New   PT LONG TERM GOAL #4   Title Pt will demonstrate an increase in activity tolerance to ambulate with LRD for 20 minutes with no reports of pain >2/10 in order to return to prior level of function   Baseline Pain: 7/10 at rest and pain with standing and walking immeidiately   Time 4   Period Weeks   Status New             Plan - 06/04/15 1656    Clinical Impression Statement Pt demonstrates R piriformis tightness with complaints of slight R groin pain. Pt shows progress in flexbility with R TFL stretch in supine. Pt continues to present hip stiffness and poor posture with ambulation.  Decrease overall R hip/ ITB tenderness noted during tx. since having cortisone injection last week.  Pt. presents with improved gait pattern as compared to previous tx. session.     Pt will benefit from skilled therapeutic intervention in order to improve on the following deficits Abnormal gait;Decreased activity tolerance;Decreased balance;Decreased endurance;Decreased range of motion;Difficulty walking;Hypomobility;Decreased strength;Pain;Improper body mechanics;Decreased mobility   Rehab Potential Fair   PT Frequency 2x / week   PT Duration 4 weeks   PT Treatment/Interventions ADLs/Self Care Home Management;Gait training;Functional mobility training;Therapeutic exercise;Balance training;Therapeutic activities;Neuromuscular re-education;Patient/family education;Manual techniques;Passive range of motion;Cryotherapy;Electrical Stimulation;Moist Heat   PT Next Visit Plan  strengthening and gait training.  Pain mgmt/ icing to R hip.  Reassess goals (discharge vs. recert).     PT Home Exercise Plan reviewed HEP   Consulted and Agree with Plan of Care Patient        Problem List Patient Active Problem List   Diagnosis Date Noted  . Arthrofibrosis of knee joint 03/05/2014  . Hypokalemia 05/30/2013  . OA (osteoarthritis) of knee 05/29/2013  . Postoperative stiffness of total knee replacement ([place]) 02/15/2013  . Postoperative anemia due to acute blood loss 12/09/2012  . 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 [place], [place], [address] Phone: [phone]   Fax:  [phone]  Name: NAWAAL REINO MRN: [id number] Date of Birth: Apr 14, 1951

## 2015-06-06 ENCOUNTER — Ambulatory Visit: Payer: Medicare HMO | Attending: Orthopedic Surgery | Admitting: Physical Therapy

## 2015-06-06 DIAGNOSIS — R262 Difficulty in walking, not elsewhere classified: Secondary | ICD-10-CM | POA: Diagnosis present

## 2015-06-06 DIAGNOSIS — M6281 Muscle weakness (generalized): Secondary | ICD-10-CM

## 2015-06-06 DIAGNOSIS — R269 Unspecified abnormalities of gait and mobility: Secondary | ICD-10-CM | POA: Insufficient documentation

## 2015-06-06 DIAGNOSIS — M25551 Pain in right hip: Secondary | ICD-10-CM | POA: Diagnosis not present

## 2015-06-06 DIAGNOSIS — M25651 Stiffness of right hip, not elsewhere classified: Secondary | ICD-10-CM

## 2015-06-06 DIAGNOSIS — M25562 Pain in left knee: Secondary | ICD-10-CM | POA: Diagnosis present

## 2015-06-07 ENCOUNTER — Encounter: Payer: Self-pay | Admitting: Physical Therapy

## 2015-06-07 NOTE — Therapy (Signed)
[facility] [address] [place], [place], [address] Phone: [phone]   Fax:  [phone]  Physical Therapy Treatment  Patient Details  Name: [name] [name] MRN: [id number] Date of Birth: 08-29-1950 No Data Recorded  Encounter Date: 06/06/2015      PT End of Session - 06/07/15 1632    Visit Number 7   Number of Visits 12   Date for PT Re-Evaluation 06/11/15   Authorization - Visit Number 7   Authorization - Number of Visits 10   PT Start Time 1450   PT Stop Time 1545   PT Time Calculation (min) 55 min   Equipment Utilized During Treatment Other (comment)   Activity Tolerance Patient tolerated treatment well;No increased pain   Behavior During Therapy [facility] for tasks assessed/performed      Past Medical History  Diagnosis Date  . Hypertension   . Arthritis   . Vertigo     hx post op  . Complication of anesthesia     terrible vertigo  . PONV (postoperative nausea and vomiting)     related to vertigo  . Shortness of breath     WITH EXERTION    Past Surgical History  Procedure Laterality Date  . Back surgery  2010    cervical fusion  . Foot surgery Left     heel---STATES METAL IN THE HEEL - FOOT TURNS OUTWARD  . Carpal tunnel release Right   . Breast surgery Left     biopsy  . Total knee revision Left 12/07/2012    Procedure:  TOTAL KNEE ARTHROPLASTY REVISION;  Surgeon: Gearlean Alf, MD;  Location: [facility];  Service: Orthopedics;  Laterality: Left;  . Knee closed reduction Left 02/15/2013    Procedure: CLOSED MANIPULATION LEFT KNEE;  Surgeon: Gearlean Alf, MD;  Location: [facility];  Service: Orthopedics;  Laterality: Left;  . Heel spur surgery    . Joint replacement Left 2013    knee  . Knee arthrotomy Left 05/29/2013    Procedure: LEFT KNEE ARTHROTOMY WITH SCAR EXCISION;  Surgeon: Gearlean Alf, MD;  Location: [facility];  Service: Orthopedics;  Laterality: Left;  . Knee arthrotomy Left 03/05/2014    Procedure:  LEFT KNEE ARTHROTOMY/SCAR EXCISION/POLYETHYLENE REVISION;  Surgeon: Gearlean Alf, MD;  Location: [facility];  Service: Orthopedics;  Laterality: Left;    There were no vitals filed for this visit.  Visit Diagnosis:  Pain in right hip  Stiffness of hip joint, right  Abnormality of gait  Difficulty walking  Pain in left knee  Muscle weakness      Subjective Assessment - 06/07/15 1622    Subjective Pt. states R hip pain is better and c/o no pain at rest but still has hip tenderness with palpation.  Pt. reports she is walking better but still limited by L knee issues.    Limitations Walking;House hold activities;Lifting   Patient Stated Goals return to golf/walk without pain/sleep without R hip waking up/"normal" stage of activity/prior level of function.    Currently in Pain? No/denies      Objectives: Nustep: 15 mins (no charge/warm up)- no increase c/o pain in R hip. Supine/ L sidelying hip abd. With manual resistance 20x.  Reviewed HEP in depth. Manual: Piriformis stretch x 4 for 30 seconds. Proximal and distal hamstring stretch x 2 for 30 seconds. TFL stretch x 4 for 30 seconds. Seated L knee PROM flexion/ extension stretches 4x each. Seated L/R gastroc manual stretches  3x. Seated/supine Thomas stretches (R hip flexor tightness noted as compared to R).  Pt response to treatment for medical necessity: Discharge to HEP at this time with focus on L knee ROM/strength and maintaining hip stability to improve independence with pain-free walking/ mobility.       PT Long Term Goals - June 18, 2015 1621    PT LONG TERM GOAL #1   Title Pt will increase R hip abduction by 1/2 MMT grade in order to return to golf.   Baseline Increase strength but unable to golf   Time 4   Period Weeks   Status Partially Met   PT LONG TERM GOAL #2   Title Pt will increase R hip flexion AROM to WNL of L hip flexion in order increase functional mobility   Time 4   Period Weeks   Status  Achieved   PT LONG TERM GOAL #3   Title Pt will improve LEFS score to > 40/80 in order to report decreased pain with sleeping on R side   Baseline 29/80 on 06/06/15 (Marked improvement).    Time 4   Period Weeks   Status Partially Met   PT LONG TERM GOAL #4   Title Pt will demonstrate an increase in activity tolerance to ambulate with LRD for 20 minutes with no reports of pain >2/10 in order to return to prior level of function   Time 4   Period Weeks   Status Partially Met           Plan - 06-18-15 1633    Clinical Impression Statement Discharge from PT at this time with focus on a more independent ex. program.  Pts. progress with gait/ return to golf limited by chronic L knee issues (ROM/ strength deficits).  Pt. continues to benefit from use of SPC with all aspects of gait indoor/outdoors.  Pt. instructed to contact PT if R hip pain returns.     Pt will benefit from skilled therapeutic intervention in order to improve on the following deficits Abnormal gait;Decreased activity tolerance;Decreased balance;Decreased endurance;Decreased range of motion;Difficulty walking;Hypomobility;Decreased strength;Pain;Improper body mechanics;Decreased mobility   Rehab Potential Fair   PT Frequency 2x / week   PT Duration 4 weeks   PT Treatment/Interventions ADLs/Self Care Home Management;Gait training;Functional mobility training;Therapeutic exercise;Balance training;Therapeutic activities;Neuromuscular re-education;Patient/family education;Manual techniques;Passive range of motion;Cryotherapy;Electrical Stimulation;Moist Heat   PT Next Visit Plan Discharge to HEP at this time.    PT Home Exercise Plan reviewed HEP   Consulted and Agree with Plan of Care Patient          G-Codes - 2015/06/18 1637    Functional Assessment Tool Used LEFS/ clinical judgement/ pain/ gait   Functional Limitation Mobility: Walking and moving around   Mobility: Walking and Moving Around Current Status [phone]) At least  20 percent but less than 40 percent impaired, limited or restricted   Mobility: Walking and Moving Around Goal Status [phone]) At least 20 percent but less than 40 percent impaired, limited or restricted   Mobility: Walking and Moving Around Discharge Status [phone]) At least 20 percent but less than 40 percent impaired, limited or restricted      Problem List Patient Active Problem List   Diagnosis Date Noted  . Arthrofibrosis of knee joint 03/05/2014  . Hypokalemia 05/30/2013  . OA (osteoarthritis) of knee 05/29/2013  . Postoperative stiffness of total knee replacement ([place]) 02/15/2013  . Postoperative anemia due to acute blood loss 12/09/2012  . 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

## 2015-07-18 ENCOUNTER — Encounter
Admission: RE | Admit: 2015-07-18 | Discharge: 2015-07-18 | Disposition: A | Discharge status: Home / Self Care | Payer: Medicare HMO | Source: Ambulatory Visit | Attending: Obstetrics and Gynecology | Admitting: Obstetrics and Gynecology

## 2015-07-18 DIAGNOSIS — Z01812 Encounter for preprocedural laboratory examination: Secondary | ICD-10-CM | POA: Diagnosis not present

## 2015-07-18 LAB — CBC
HEMATOCRIT: 40.6 % (ref 35.0–47.0)
HEMOGLOBIN: 13.2 g/dL (ref 12.0–16.0)
MCH: 27.3 pg (ref 26.0–34.0)
MCHC: 32.5 g/dL (ref 32.0–36.0)
MCV: 84.1 fL (ref 80.0–100.0)
Platelets: 336 10*3/uL (ref 150–440)
RBC: 4.83 MIL/uL (ref 3.80–5.20)
RDW: 15.3 % — ABNORMAL HIGH (ref 11.5–14.5)
WBC: 8.7 10*3/uL (ref 3.6–11.0)

## 2015-07-18 LAB — BASIC METABOLIC PANEL
ANION GAP: 6 (ref 5–15)
BUN: 15 mg/dL (ref 6–20)
CALCIUM: 9 mg/dL (ref 8.9–10.3)
CO2: 30 mmol/L (ref 22–32)
Chloride: 103 mmol/L (ref 101–111)
Creatinine, Ser: 0.68 mg/dL (ref 0.44–1.00)
GLUCOSE: 93 mg/dL (ref 65–99)
POTASSIUM: 3.4 mmol/L — AB (ref 3.5–5.1)
Sodium: 139 mmol/L (ref 135–145)

## 2015-07-18 LAB — ABO/RH: ABO/RH(D): O POS

## 2015-07-18 LAB — TYPE AND SCREEN
ABO/RH(D): O POS
ANTIBODY SCREEN: NEGATIVE

## 2015-07-18 NOTE — Pre-Procedure Instructions (Addendum)
Medical clearane note requested by Dr Zara Chess re ekg caleed ad faxed to DR Peralta. ALSO CALLED AND FAXED TO DR Steffanie Rainwater WITH ANGIE CLEARED LOW RISK BY DR Netty Starring

## 2015-07-18 NOTE — H&P (Signed)
HPI:  Pt presents for a preoperative visit to schedule a D&C and hysteroscopy for evaluation of postmenopausal bleeding with a thickened endometrial stripe. 2 weeks of heavy bleeding.  She has a hx of: 3 knee replacements, vertigo after anesthesia, HTN.  Workup has included:  EMBX 2/14/16ENDOMETRIUM, BIOPSY:  FRAGMENTED INACTIVE ENDOMETRIUM SHOWING EXTENSIVE BREAKDOWN.  NO ATYPICAL HYPERPLASIA OR CARCINOMA.   TVUS:  Ut wnl Endometrium=7.92 mm Abn postmenopause bil ovs wnl No adnexal masses seen  Pap smear 06/14/15 NEGATIVE FOR INTRAEPITHELIAL LESION AND MALIGNANCY.   Past Medical History:  has a past medical history of Allergic state; Arthritis; Borderline diabetes; Hypertension; and Shingles.  Past Surgical History:  has a past surgical history that includes Knee arthroscopy (Bilateral); Endoscopic Carpal Tunnel Release (09/16/2011); Heel spur removed on right (2006); Anterior cervical diskectomy and fusion C4-5. Anterior cervical fusion C5-6. Diskectomy, anterior with removal of posterior osteophytes, C4-5 and C5-6. Insertion of interbody device Peek spacer C4-5 and C5-6. Application of anterior cer; LEFT TOTAL KNEE ARTHROPLASTY (04/15/2012); MANIPULATION UNDER ANESTHESIA - LEFT KNEE (06/03/2012); LEFT TOTAL KNEE REVISION ARTHROPLASTY (12/07/2012); MANIPULATION UNDER ANESTHESIA - LEFT KNEE (02/15/2013); LEFT KNEE ARTHROTOMY WITH SCAR EXCISION (05/30/2013); and Left knee surgery (03/05/14). Family History: family history includes Alcohol abuse in her father; Cancer in her other; Colon cancer in her father; Heart disease in her mother and son; Sickle cell anemia in her sister; Sleep apnea in her son. Social History:  reports that she has never smoked. She has never used smokeless tobacco. She reports that she does not drink alcohol or use illicit drugs. OB/GYN History:     OB History    No data available      Allergies: is allergic to grass pollen-bermuda, standard and  oxycodone. Medications:  Current Outpatient Prescriptions:  . acetaminophen (TYLENOL) 500 MG tablet, Take 500 mg by mouth once daily as needed for Pain., Disp: , Rfl:  . hydrochlorothiazide (HYDRODIURIL) 25 MG tablet, Take 25 mg by mouth once daily. , Disp: , Rfl:  . meclizine (ANTIVERT) 25 mg tablet, Take 25 mg by mouth 3 (three) times daily as needed for Dizziness. Reported on 04/30/2015 , Disp: , Rfl:  . triamcinolone 0.1 % cream, Apply topically 2 (two) times daily as needed. Reported on 07/01/2015 , Disp: , Rfl:   Review of Systems: See HPI   Exam:      Vitals:   07/01/15 1633  BP: 134/76  Pulse: 76    WDWN black female in NAD Body mass index is 33.37 kg/(m^2). Lungs: CTA  CV : RRR without murmur  HEENT: Trachea midline Breast: deferred Neck: no thyromegaly Abdomen: soft , no mass, normal active bowel sounds, non-tender, no rebound tenderness  Pelvic: Deferred  Impression:   The primary encounter diagnosis was PMB (postmenopausal bleeding). A diagnosis of Thickened endometrium was also pertinent to this visit.    Plan:   - Preoperative visit: D&C hysteroscopy for treatment of postmenopausal bleeding with a thickened endometrial stripe. Consents signed today. Risks of surgery were discussed with the patient including but not limited to: bleeding which may require transfusion; infection which may require antibiotics; injury to uterus or surrounding organs; intrauterine scarring which may impair future fertility; need for additional procedures including laparotomy or laparoscopy; and other postoperative/anesthesia complications. Written informed consent was obtained.  This is a scheduled same-day surgery. She will have a postop visit in 2 weeks to review operative findings and pathology.

## 2015-07-18 NOTE — Patient Instructions (Signed)
  Your procedure is scheduled on: July 26, 2015 (Friday) Report to Day Surgery.[facility]) Second Floor To find out your arrival time please call [phone] between 1PM - 3PM on July 25, 2015 (Thursday).  Remember: Instructions that are not followed completely may result in serious medical risk, up to and including death, or upon the discretion of your surgeon and anesthesiologist your surgery may need to be rescheduled.    __x__ 1. Do not eat food or drink liquids after midnight. No gum chewing or hard candies.     ____ 2. No Alcohol for 24 hours before or after surgery.   ____ 3. Bring all medications with you on the day of surgery if instructed.    __x__ 4. Notify your doctor if there is any change in your medical condition     (cold, fever, infections).     Do not wear jewelry, make-up, hairpins, clips or nail polish.  Do not wear lotions, powders, or perfumes. You may wear deodorant.  Do not shave 48 hours prior to surgery. Men may shave face and neck.  Do not bring valuables to the hospital.    [facility] is not responsible for any belongings or valuables.               Contacts, dentures or bridgework may not be worn into surgery.  Leave your suitcase in the car. After surgery it may be brought to your room.  For patients admitted to the hospital, discharge time is determined by your                treatment team.   Patients discharged the day of surgery will not be allowed to drive home.   Please read over the following fact sheets that you were given:   Surgical Site Infection Prevention   ____ Take these medicines the morning of surgery with A SIP OF WATER:    1.   2.   3.   4.  5.  6.  ____ Fleet Enema (as directed)   __x__ Use CHG Soap as directed  ____ Use inhalers on the day of surgery  ____ Stop metformin 2 days prior to surgery    ____ Take 1/2 of usual insulin dose the night before surgery and none on the morning of surgery.   __x__ Stop  Coumadin/Plavix/aspirin on (N/A)  __x_ Stop Anti-inflammatories on (NO NSAIDS) TYLENOL OK TO TAKE FOR PAIN IF NEEDED   ____ Stop supplements until after surgery.    ____ Bring C-Pap to the hospital.

## 2015-07-26 ENCOUNTER — Encounter: Payer: Self-pay | Admitting: *Deleted

## 2015-07-26 ENCOUNTER — Ambulatory Visit: Payer: Medicare HMO | Admitting: Anesthesiology

## 2015-07-26 ENCOUNTER — Encounter
Admission: RE | Disposition: A | Discharge status: Home / Self Care | Payer: Self-pay | Source: Ambulatory Visit | Attending: Obstetrics and Gynecology

## 2015-07-26 ENCOUNTER — Ambulatory Visit
Admission: RE | Admit: 2015-07-26 | Discharge: 2015-07-26 | Disposition: A | Discharge status: Home / Self Care | Payer: Medicare HMO | Source: Ambulatory Visit | Attending: Obstetrics and Gynecology | Admitting: Obstetrics and Gynecology

## 2015-07-26 DIAGNOSIS — Z9109 Other allergy status, other than to drugs and biological substances: Secondary | ICD-10-CM | POA: Insufficient documentation

## 2015-07-26 DIAGNOSIS — Z8249 Family history of ischemic heart disease and other diseases of the circulatory system: Secondary | ICD-10-CM | POA: Insufficient documentation

## 2015-07-26 DIAGNOSIS — Z885 Allergy status to narcotic agent status: Secondary | ICD-10-CM | POA: Diagnosis not present

## 2015-07-26 DIAGNOSIS — Z981 Arthrodesis status: Secondary | ICD-10-CM | POA: Insufficient documentation

## 2015-07-26 DIAGNOSIS — N840 Polyp of corpus uteri: Secondary | ICD-10-CM | POA: Diagnosis not present

## 2015-07-26 DIAGNOSIS — Z8669 Personal history of other diseases of the nervous system and sense organs: Secondary | ICD-10-CM | POA: Insufficient documentation

## 2015-07-26 DIAGNOSIS — I10 Essential (primary) hypertension: Secondary | ICD-10-CM | POA: Insufficient documentation

## 2015-07-26 DIAGNOSIS — Z811 Family history of alcohol abuse and dependence: Secondary | ICD-10-CM | POA: Diagnosis not present

## 2015-07-26 DIAGNOSIS — R918 Other nonspecific abnormal finding of lung field: Secondary | ICD-10-CM | POA: Diagnosis not present

## 2015-07-26 DIAGNOSIS — Z800 Family history of malignant neoplasm of digestive organs: Secondary | ICD-10-CM | POA: Insufficient documentation

## 2015-07-26 DIAGNOSIS — R938 Abnormal findings on diagnostic imaging of other specified body structures: Secondary | ICD-10-CM | POA: Insufficient documentation

## 2015-07-26 DIAGNOSIS — R7303 Prediabetes: Secondary | ICD-10-CM | POA: Diagnosis not present

## 2015-07-26 DIAGNOSIS — Z809 Family history of malignant neoplasm, unspecified: Secondary | ICD-10-CM | POA: Insufficient documentation

## 2015-07-26 DIAGNOSIS — N950 Postmenopausal bleeding: Secondary | ICD-10-CM | POA: Insufficient documentation

## 2015-07-26 DIAGNOSIS — N72 Inflammatory disease of cervix uteri: Secondary | ICD-10-CM | POA: Diagnosis not present

## 2015-07-26 DIAGNOSIS — Z836 Family history of other diseases of the respiratory system: Secondary | ICD-10-CM | POA: Diagnosis not present

## 2015-07-26 DIAGNOSIS — Z96652 Presence of left artificial knee joint: Secondary | ICD-10-CM | POA: Insufficient documentation

## 2015-07-26 HISTORY — PX: HYSTEROSCOPY WITH D & C: SHX1775

## 2015-07-26 SURGERY — DILATATION AND CURETTAGE /HYSTEROSCOPY
Anesthesia: General | Wound class: Clean Contaminated

## 2015-07-26 MED ORDER — ONDANSETRON HCL 4 MG/2ML IJ SOLN
INTRAMUSCULAR | Status: DC | PRN
  Administered 2015-07-26: 4 mg via INTRAVENOUS

## 2015-07-26 MED ORDER — ONDANSETRON HCL 4 MG/2ML IJ SOLN: 4.0000 mg | Freq: Once | INTRAMUSCULAR | Status: DC | PRN

## 2015-07-26 MED ORDER — IBUPROFEN 800 MG PO TABS
800.0000 mg | ORAL_TABLET | Freq: Once | ORAL | Status: AC
  Administered 2015-07-26: 800 mg via ORAL

## 2015-07-26 MED ORDER — LACTATED RINGERS IV SOLN
INTRAVENOUS | Status: DC
Start: 2015-07-26 — End: 2015-07-26

## 2015-07-26 MED ORDER — IBUPROFEN 800 MG PO TABS: 800.0000 mg | ORAL_TABLET | Freq: Three times a day (TID) | ORAL | Status: DC | PRN

## 2015-07-26 MED ORDER — FENTANYL CITRATE (PF) 100 MCG/2ML IJ SOLN
INTRAMUSCULAR | Status: DC | PRN
  Administered 2015-07-26: 50 ug via INTRAVENOUS
  Administered 2015-07-26 (×2): 25 ug via INTRAVENOUS

## 2015-07-26 MED ORDER — LACTATED RINGERS IV SOLN
INTRAVENOUS | Status: DC
  Administered 2015-07-26 (×3): via INTRAVENOUS

## 2015-07-26 MED ORDER — IBUPROFEN 800 MG PO TABS
ORAL_TABLET | ORAL | Status: AC
  Filled 2015-07-26: qty 1

## 2015-07-26 MED ORDER — LIDOCAINE HCL (CARDIAC) 20 MG/ML IV SOLN
INTRAVENOUS | Status: DC | PRN
  Administered 2015-07-26: 100 mg via INTRAVENOUS

## 2015-07-26 MED ORDER — ONDANSETRON 4 MG PO TBDP: 4.0000 mg | ORAL_TABLET | Freq: Four times a day (QID) | ORAL | Status: DC | PRN

## 2015-07-26 MED ORDER — FAMOTIDINE 20 MG PO TABS
20.0000 mg | ORAL_TABLET | Freq: Once | ORAL | Status: AC
  Administered 2015-07-26: 20 mg via ORAL

## 2015-07-26 MED ORDER — KETOROLAC TROMETHAMINE 30 MG/ML IJ SOLN
INTRAMUSCULAR | Status: DC | PRN
  Administered 2015-07-26: 30 mg via INTRAVENOUS

## 2015-07-26 MED ORDER — FAMOTIDINE 20 MG PO TABS
ORAL_TABLET | ORAL | Status: AC
  Administered 2015-07-26: 20 mg via ORAL
  Filled 2015-07-26: qty 1

## 2015-07-26 MED ORDER — PHENYLEPHRINE HCL 10 MG/ML IJ SOLN
INTRAMUSCULAR | Status: DC | PRN
  Administered 2015-07-26 (×2): 100 ug via INTRAVENOUS

## 2015-07-26 MED ORDER — GLYCOPYRROLATE 0.2 MG/ML IJ SOLN
INTRAMUSCULAR | Status: DC | PRN
  Administered 2015-07-26: 0.2 mg via INTRAVENOUS

## 2015-07-26 MED ORDER — DEXAMETHASONE SODIUM PHOSPHATE 10 MG/ML IJ SOLN
INTRAMUSCULAR | Status: DC | PRN
Start: 2015-07-26 — End: 2015-07-26
  Administered 2015-07-26: 10 mg via INTRAVENOUS

## 2015-07-26 MED ORDER — FENTANYL CITRATE (PF) 100 MCG/2ML IJ SOLN: 25.0000 ug | INTRAMUSCULAR | Status: DC | PRN

## 2015-07-26 MED ORDER — PROPOFOL 10 MG/ML IV BOLUS
INTRAVENOUS | Status: DC | PRN
  Administered 2015-07-26: 150 mg via INTRAVENOUS

## 2015-07-26 SURGICAL SUPPLY — 19 items
CANISTER SUCT 3000ML (MISCELLANEOUS) ×3 IMPLANT
CATH ROBINSON RED A/P 16FR (CATHETERS) ×3 IMPLANT
CORD URO TURP 10FT (MISCELLANEOUS) IMPLANT
ELECT LOOP MED HF 24F 12D (CUTTING LOOP) IMPLANT
ELECT REM PT RETURN 9FT ADLT (ELECTROSURGICAL) ×3
ELECT RESECT POWERBALL 24F (MISCELLANEOUS) IMPLANT
ELECTRODE REM PT RTRN 9FT ADLT (ELECTROSURGICAL) ×1 IMPLANT
GLOVE BIO SURGEON STRL SZ 6.5 (GLOVE) ×2 IMPLANT
GLOVE BIO SURGEONS STRL SZ 6.5 (GLOVE) ×1
GLOVE INDICATOR 7.0 STRL GRN (GLOVE) ×3 IMPLANT
GOWN STRL REUS W/ TWL LRG LVL3 (GOWN DISPOSABLE) ×2 IMPLANT
GOWN STRL REUS W/TWL LRG LVL3 (GOWN DISPOSABLE) ×4
IV LACTATED RINGERS 1000ML (IV SOLUTION) ×3 IMPLANT
KIT RM TURNOVER CYSTO AR (KITS) ×3 IMPLANT
PACK DNC HYST (MISCELLANEOUS) ×3 IMPLANT
PAD OB MATERNITY 4.3X12.25 (PERSONAL CARE ITEMS) ×3 IMPLANT
PAD PREP 24X41 OB/GYN DISP (PERSONAL CARE ITEMS) ×3 IMPLANT
TUBING CONNECTING 10 (TUBING) ×2 IMPLANT
TUBING CONNECTING 10' (TUBING) ×1

## 2015-07-26 NOTE — Transfer of Care (Signed)
Immediate Anesthesia Transfer of Care Note  Patient: [name] [name]  Procedure(s) Performed: Procedure(s): DILATATION AND CURETTAGE /HYSTEROSCOPY with cervical biopsy (N/A)  Patient Location: PACU  Anesthesia Type:General  Level of Consciousness: awake, alert  and oriented  Airway & Oxygen Therapy: Patient Spontanous Breathing and Patient connected to face mask oxygen  Post-op Assessment: Report given to RN and Post -op Vital signs reviewed and stable  Post vital signs: Reviewed and stable  Last Vitals:  Filed Vitals:   07/26/15 0614  BP: 129/94  Pulse: 58  Temp: 36.7 C  Resp: 16    Complications: No apparent anesthesia complications

## 2015-07-26 NOTE — Discharge Instructions (Signed)
Discharge instructions after a hysteroscopy with dilation and curettage  Signs and Symptoms to Report  Call our office at [phone] if you have any of the following:   . Fever over 100.4 degrees or higher . Severe stomach pain not relieved with pain medications . Bright red bleeding that's heavier than a period that does not slow with rest after the first 24 hours . To go the bathroom a lot (frequency), you can't hold your urine (urgency), or it hurts when you empty your bladder (urinate) . Chest pain . Shortness of breath . Pain in the calves of your legs . Severe nausea and vomiting not relieved with anti-nausea medications . Any concerns  What You Can Expect after Surgery . You may see some pink tinged, bloody fluid. This is normal. You may also have cramping for several days.   Activities after Your Discharge Follow these guidelines to help speed your recovery at home: . Don't drive if you are in pain or taking narcotic pain medicine. You may drive when you can safely slam on the brakes, turn the wheel forcefully, and rotate your torso comfortably. This is typically 4-7 days. Practice in a parking lot or side street prior to attempting to drive regularly.  . Ask others to help with household chores for 4 weeks. . Don't do strenuous activities, exercises, or sports like vacuuming, tennis, squash, etc. until your doctor says it is safe to do so. . Walk as you feel able. Rest often since it may take a week or two for your energy level to return to normal.  . You may climb stairs . Avoid constipation:   -Eat fruits, vegetables, and whole grains. Eat small meals as your appetite will take time to return to normal.   -Drink 6 to 8 glasses of water each day unless your doctor has told you to limit your fluids.   -Use a laxative or stool softener as needed if constipation becomes a problem. You may take Miralax, metamucil, Citrucil, Colace, Senekot, FiberCon, etc. If this does not  relieve the constipation, try two tablespoons of Milk Of Magnesia every 8 hours until your bowels move.  . You may shower.  . Do not get in a hot tub, swimming pool, etc. until your doctor agrees. . Do not douche, use tampons, or have sex until your doctor says it is okay, usually about 2 weeks. . Take your pain medicine when you need it. The medicine may not work as well if the pain is bad.  Take the medicines you were taking before surgery. Other medications you might need are pain medications (ibuprofen), medications for constipation (Colace) and nausea medications (Zofran).        AMBULATORY SURGERY  DISCHARGE INSTRUCTIONS   1) The drugs that you were given will stay in your system until tomorrow so for the next 24 hours you should not:  A) Drive an automobile B) Make any legal decisions C) Drink any alcoholic beverage   2) You may resume regular meals tomorrow.  Today it is better to start with liquids and gradually work up to solid foods.  You may eat anything you prefer, but it is better to start with liquids, then soup and crackers, and gradually work up to solid foods.   3) Please notify your doctor immediately if you have any unusual bleeding, trouble breathing, redness and pain at the surgery site, drainage, fever, or pain not relieved by medication.    4) Additional Instructions:          Please contact your physician with any problems or Same Day Surgery at [phone], Monday through Friday 6 am to 4 pm, or [place] [facility] [place] Main number at [phone]. 

## 2015-07-26 NOTE — Op Note (Signed)
Operative Report Hysteroscopy with Dilation and Curettage   Indications: Postmenopausal bleeding   Pre-operative Diagnosis: Thickened postmenopausal endometrial stripe   Post-operative Diagnosis: Cervical lesion; endometrial polyp.  Procedure: 1. Exam under anesthesia 2. Cervical biopsy 3. Fractional D&C 4. Hysteroscopy 5. Polypectomy  Surgeon: Benjaman Kindler, MD  Assistant(s):  None  Anesthesia: General endotracheal anesthesia  Anesthesiologist: Gunnar Fusi, MD Anesthesiologist: Gunnar Fusi, MD CRNA: Demetrius Charity, CRNA; Marsh Dolly, CRNA  Estimated Blood Loss:  Minimal         Intraoperative medications: Toradol         Total IV Fluids: 1000 ml  Urine Output: 57ml  Total Fluid Deficit:  Not measured         Specimens: Endocervical curettings, endometrial curettings, cervical biopsy         Complications:  None; patient tolerated the procedure well.         Disposition: PACU - hemodynamically stable.         Condition: stable  Findings: Uterus measuring 9 cm by sound; cervix with normal shape, no masses in the rectovaginal septum, 14mm pigmented lesion at 1-2 o'clock, normal vagina, perineum.  There was a calcified-appearing white and irregular lesion at the fundus with a normal-appearing polyp behind it.   Indication for procedure/Consents: 65 y.o. here for scheduled surgery for the aforementioned diagnoses; endometrial biopsy was attempted in the office and was abandoned because of limited mobility in the left knee. Risks of surgery were discussed with the patient including but not limited to: bleeding which may require transfusion; infection which may require antibiotics; injury to uterus or surrounding organs; intrauterine scarring which may impair future fertility; need for additional procedures including laparotomy or laparoscopy; and other postoperative/anesthesia complications. Written informed consent was obtained.    Procedure Details:   Fractional D&C only  The patient was taken to the operating room where she was positioned in dorsal lithotomy while awake in deference to her painful knee. General anesthesia was administered and was found to be adequate. After a formal and adequate timeout was performed, she was examined with the above findings. She was then prepped and draped in the sterile manner. Her bladder was catheterized for an estimated amount of clear, yellow urine. A weighed speculum was then placed in the patient's vagina and a single tooth tenaculum was applied to the anterior lip of the cervix. A cervical biopsy was taken of the visible lesion.   Her cervix was serially dilated to 15 [place] using Hanks dilators. An ECC was performed. The hysteroscope was introduced to reveal the above findings. A hysteroscopic biopsy forcep was used to grasp the polyp and lesion under direct visualization. A sharp curettage was then performed until there was a gritty texture in all four quadrants. The tenaculum was removed from the anterior lip of the cervix and the vaginal speculum was removed after applying silver nitrate for good hemostasis.   The patient tolerated the procedure well and was taken to the recovery area awake and in stable condition. She received iv acetaminophen and Toradol prior to leaving the OR.  The patient will be discharged to home as per PACU criteria. Routine postoperative instructions given. She was prescribed Ibuprofen and Colace. She will follow up in the clinic in two weeks for postoperative evaluation.

## 2015-07-26 NOTE — Anesthesia Procedure Notes (Signed)
Procedure Name: LMA Insertion Date/Time: 07/26/2015 7:45 AM Performed by: Marsh Dolly Pre-anesthesia Checklist: Patient identified, Patient being monitored, Timeout performed, Emergency Drugs available and Suction available Patient Re-evaluated:Patient Re-evaluated prior to inductionOxygen Delivery Method: Circle system utilized Preoxygenation: Pre-oxygenation with 100% oxygen Intubation Type: IV induction Ventilation: Mask ventilation without difficulty LMA: LMA inserted LMA Size: 4.5 Tube type: Oral Number of attempts: 1 Placement Confirmation: positive ETCO2 and breath sounds checked- equal and bilateral Tube secured with: Tape Dental Injury: Teeth and Oropharynx as per pre-operative assessment

## 2015-07-26 NOTE — Anesthesia Preprocedure Evaluation (Signed)
Anesthesia Evaluation  Patient identified by MRN, date of birth, ID band Patient awake    Reviewed: Allergy & Precautions, NPO status , Patient's Chart, lab work & pertinent test results  History of Anesthesia Complications (+) PONV  Airway Mallampati: I       Dental  (+) Partial Lower, Upper Dentures   Pulmonary neg pulmonary ROS,           Cardiovascular hypertension, Pt. on medications      Neuro/Psych negative neurological ROS     GI/Hepatic negative GI ROS, Neg liver ROS,   Endo/Other  negative endocrine ROS  Renal/GU negative Renal ROS     Musculoskeletal   Abdominal   Peds  Hematology  (+) anemia ,   Anesthesia Other Findings   Reproductive/Obstetrics                             Anesthesia Physical Anesthesia Plan  ASA: II  Anesthesia Plan: General   Post-op Pain Management:    Induction: Intravenous  Airway Management Planned: LMA  Additional Equipment:   Intra-op Plan:   Post-operative Plan:   Informed Consent: I have reviewed the patients History and Physical, chart, labs and discussed the procedure including the risks, benefits and alternatives for the proposed anesthesia with the patient or authorized representative who has indicated his/her understanding and acceptance.     Plan Discussed with:   Anesthesia Plan Comments:         Anesthesia Quick Evaluation

## 2015-07-26 NOTE — Anesthesia Postprocedure Evaluation (Signed)
Anesthesia Post Note  Patient: [name] [name]  Procedure(s) Performed: Procedure(s) (LRB): DILATATION AND CURETTAGE /HYSTEROSCOPY with cervical biopsy (N/A)  Patient location during evaluation: PACU Anesthesia Type: General Level of consciousness: awake and alert Pain management: pain level controlled Vital Signs Assessment: post-procedure vital signs reviewed and stable Respiratory status: spontaneous breathing and respiratory function stable Cardiovascular status: stable Anesthetic complications: no    Last Vitals:  Filed Vitals:   07/26/15 0848 07/26/15 0903  BP: 130/80 122/75  Pulse: 55 61  Temp:    Resp: 16 15    Last Pain:  Filed Vitals:   07/26/15 0905  PainSc: 3                  KEPHART,WILLIAM K

## 2015-07-26 NOTE — Interval H&P Note (Signed)
History and Physical Interval Note:  07/26/2015 7:28 AM  [name] [name]  has presented today for surgery, with the diagnosis of Post Menopausal Bleeding  The various methods of treatment have been discussed with the patient and family. After consideration of risks, benefits and other options for treatment, the patient has consented to  Procedure(s): DILATATION AND CURETTAGE /HYSTEROSCOPY (N/A) as a surgical intervention .  The patient's history has been reviewed, patient examined, no change in status, stable for surgery.  I have reviewed the patient's chart and labs.  Questions were answered to the patient's satisfaction.     Benjaman Kindler

## 2015-07-29 LAB — SURGICAL PATHOLOGY

## 2015-12-18 ENCOUNTER — Encounter
Admission: RE | Admit: 2015-12-18 | Discharge: 2015-12-18 | Disposition: A | Discharge status: Home / Self Care | Payer: Medicare HMO | Source: Ambulatory Visit | Attending: Orthopedic Surgery | Admitting: Orthopedic Surgery

## 2015-12-18 DIAGNOSIS — Z01812 Encounter for preprocedural laboratory examination: Secondary | ICD-10-CM | POA: Insufficient documentation

## 2015-12-18 LAB — TYPE AND SCREEN
ABO/RH(D): O POS
Antibody Screen: NEGATIVE

## 2015-12-18 LAB — CBC
HEMATOCRIT: 41.3 % (ref 35.0–47.0)
Hemoglobin: 13.9 g/dL (ref 12.0–16.0)
MCH: 28.9 pg (ref 26.0–34.0)
MCHC: 33.5 g/dL (ref 32.0–36.0)
MCV: 86.3 fL (ref 80.0–100.0)
Platelets: 326 10*3/uL (ref 150–440)
RBC: 4.79 MIL/uL (ref 3.80–5.20)
RDW: 15 % — AB (ref 11.5–14.5)
WBC: 8.5 10*3/uL (ref 3.6–11.0)

## 2015-12-18 LAB — URINALYSIS COMPLETE WITH MICROSCOPIC (ARMC ONLY)
BILIRUBIN URINE: NEGATIVE
Glucose, UA: NEGATIVE mg/dL
KETONES UR: NEGATIVE mg/dL
NITRITE: POSITIVE — AB
PH: 5 (ref 5.0–8.0)
Protein, ur: NEGATIVE mg/dL
Specific Gravity, Urine: 1.023 (ref 1.005–1.030)

## 2015-12-18 LAB — PROTIME-INR
INR: 0.97
Prothrombin Time: 12.9 seconds (ref 11.4–15.2)

## 2015-12-18 LAB — BASIC METABOLIC PANEL
Anion gap: 10 (ref 5–15)
BUN: 11 mg/dL (ref 6–20)
CALCIUM: 9.5 mg/dL (ref 8.9–10.3)
CO2: 27 mmol/L (ref 22–32)
CREATININE: 0.66 mg/dL (ref 0.44–1.00)
Chloride: 104 mmol/L (ref 101–111)
GFR calc Af Amer: >60 mL/min (ref >60)
GFR calc non Af Amer: >60 mL/min (ref >60)
GLUCOSE: 80 mg/dL (ref 65–99)
Potassium: 3.5 mmol/L (ref 3.5–5.1)
Sodium: 141 mmol/L (ref 135–145)

## 2015-12-18 LAB — APTT: APTT: 29 s (ref 24–36)

## 2015-12-18 LAB — SEDIMENTATION RATE: Sed Rate: 16 mm/hr (ref 0–30)

## 2015-12-18 LAB — SURGICAL PCR SCREEN
MRSA, PCR: NEGATIVE
Staphylococcus aureus: NEGATIVE

## 2015-12-18 NOTE — Patient Instructions (Signed)
  Your procedure is scheduled on: December 24, 2015 (Tuesday) Report to Same Day Surgery 2nd floor Medical Mall To find out your arrival time please call [phone] between 1PM - 3PM on December 23, 2015 (Monday)  Remember: Instructions that are not followed completely may result in serious medical risk, up to and including death, or upon the discretion of your surgeon and anesthesiologist your surgery may need to be rescheduled.    _x___ 1. Do not eat food or drink liquids after midnight. No gum chewing or hard candies.     _x___ 2. No Alcohol for 24 hours before or after surgery.   _x__3. No Smoking for 24 prior to surgery.   ____  4. Bring all medications with you on the day of surgery if instructed.    __x__ 5. Notify your doctor if there is any change in your medical condition     (cold, fever, infections).     Do not wear jewelry, make-up, hairpins, clips or nail polish.  Do not wear lotions, powders, or perfumes. You may wear deodorant.  Do not shave 48 hours prior to surgery. Men may shave face and neck.  Do not bring valuables to the hospital.    [facility] is not responsible for any belongings or valuables.               Contacts, dentures or bridgework may not be worn into surgery.  Leave your suitcase in the car. After surgery it may be brought to your room.  For patients admitted to the hospital, discharge time is determined by your treatment team.   Patients discharged the day of surgery will not be allowed to drive home.    Please read over the following fact sheets that you were given:   [facility] Preparing for Surgery and or MRSA Information   ___ Take these medicines the morning of surgery with A SIP OF WATER:    1.   2.  3.  4.  5.  6.  ____ Fleet Enema (as directed)   _x___ Use CHG Soap or sage wipes as directed on instruction sheet   ____ Use inhalers on the day of surgery and bring to hospital day of surgery  ____ Stop metformin 2 days prior to  surgery    ____ Take 1/2 of usual insulin dose the night before surgery and none on the morning of  surgery           _x___ Stop aspirin or coumadin, or plavix (NO ASPIRIN)  _x__ Stop Anti-inflammatories such as Advil, Aleve, Ibuprofen, Motrin, Naproxen,          Naprosyn, Goodies powders or aspirin products. Ok to take Tylenol.   ____ Stop supplements until after surgery.    ____ Bring C-Pap to the hospital.

## 2015-12-20 LAB — URINE CULTURE: Culture: 80000 — AB

## 2015-12-20 NOTE — Pre-Procedure Instructions (Signed)
UA/UC CALLED TO CINDY AT DR [facility]

## 2015-12-24 ENCOUNTER — Inpatient Hospital Stay: Payer: Medicare HMO | Admitting: Certified Registered"

## 2015-12-24 ENCOUNTER — Encounter
Admission: RE | Disposition: A | Discharge status: Home Health | Payer: Self-pay | Source: Ambulatory Visit | Attending: Orthopedic Surgery

## 2015-12-24 ENCOUNTER — Inpatient Hospital Stay: Payer: Medicare HMO

## 2015-12-24 ENCOUNTER — Inpatient Hospital Stay
Admission: RE | Admit: 2015-12-24 | Discharge: 2015-12-27 | DRG: 470 | Disposition: A | Discharge status: Home Health | Payer: Medicare HMO | Source: Ambulatory Visit | Attending: Orthopedic Surgery | Admitting: Orthopedic Surgery

## 2015-12-24 ENCOUNTER — Encounter: Payer: Self-pay | Admitting: *Deleted

## 2015-12-24 DIAGNOSIS — E876 Hypokalemia: Secondary | ICD-10-CM | POA: Diagnosis not present

## 2015-12-24 DIAGNOSIS — I10 Essential (primary) hypertension: Secondary | ICD-10-CM | POA: Diagnosis present

## 2015-12-24 DIAGNOSIS — Z8249 Family history of ischemic heart disease and other diseases of the circulatory system: Secondary | ICD-10-CM

## 2015-12-24 DIAGNOSIS — R42 Dizziness and giddiness: Secondary | ICD-10-CM | POA: Diagnosis not present

## 2015-12-24 DIAGNOSIS — M1611 Unilateral primary osteoarthritis, right hip: Principal | ICD-10-CM | POA: Diagnosis present

## 2015-12-24 DIAGNOSIS — E119 Type 2 diabetes mellitus without complications: Secondary | ICD-10-CM | POA: Diagnosis present

## 2015-12-24 DIAGNOSIS — Z96652 Presence of left artificial knee joint: Secondary | ICD-10-CM | POA: Diagnosis present

## 2015-12-24 DIAGNOSIS — Z808 Family history of malignant neoplasm of other organs or systems: Secondary | ICD-10-CM

## 2015-12-24 DIAGNOSIS — G8918 Other acute postprocedural pain: Secondary | ICD-10-CM

## 2015-12-24 DIAGNOSIS — Z419 Encounter for procedure for purposes other than remedying health state, unspecified: Secondary | ICD-10-CM

## 2015-12-24 HISTORY — PX: TOTAL HIP ARTHROPLASTY: SHX124

## 2015-12-24 LAB — CBC
HEMATOCRIT: 35.7 % (ref 35.0–47.0)
HEMOGLOBIN: 11.9 g/dL — AB (ref 12.0–16.0)
MCH: 28.5 pg (ref 26.0–34.0)
MCHC: 33.4 g/dL (ref 32.0–36.0)
MCV: 85.5 fL (ref 80.0–100.0)
Platelets: 276 10*3/uL (ref 150–440)
RBC: 4.18 MIL/uL (ref 3.80–5.20)
RDW: 14.5 % (ref 11.5–14.5)
WBC: 14.6 10*3/uL — ABNORMAL HIGH (ref 3.6–11.0)

## 2015-12-24 LAB — CREATININE, SERUM
Creatinine, Ser: 0.68 mg/dL (ref 0.44–1.00)
GFR calc Af Amer: >60 mL/min (ref >60)

## 2015-12-24 SURGERY — ARTHROPLASTY, HIP, TOTAL, ANTERIOR APPROACH
Anesthesia: Spinal | Site: Hip | Laterality: Right | Wound class: Clean

## 2015-12-24 MED ORDER — PROPOFOL 500 MG/50ML IV EMUL
INTRAVENOUS | Status: DC | PRN
  Administered 2015-12-24: 75 ug/kg/min via INTRAVENOUS

## 2015-12-24 MED ORDER — ONDANSETRON HCL 4 MG/2ML IJ SOLN: 4.0000 mg | Freq: Once | INTRAMUSCULAR | Status: DC | PRN

## 2015-12-24 MED ORDER — BISACODYL 10 MG RE SUPP
10.0000 mg | Freq: Every day | RECTAL | Status: DC | PRN
  Administered 2015-12-27: 10 mg via RECTAL
  Filled 2015-12-24: qty 1

## 2015-12-24 MED ORDER — LORATADINE 10 MG PO TABS
10.0000 mg | ORAL_TABLET | Freq: Every day | ORAL | Status: DC
  Administered 2015-12-26 – 2015-12-27 (×2): 10 mg via ORAL
  Filled 2015-12-24 (×3): qty 1

## 2015-12-24 MED ORDER — DIPHENHYDRAMINE HCL 12.5 MG/5ML PO ELIX
12.5000 mg | ORAL_SOLUTION | ORAL | Status: DC | PRN
Start: 2015-12-24 — End: 2015-12-27

## 2015-12-24 MED ORDER — MIDAZOLAM HCL 5 MG/5ML IJ SOLN
INTRAMUSCULAR | Status: DC | PRN
  Administered 2015-12-24: 2 mg via INTRAVENOUS
  Administered 2015-12-24: 1 mg via INTRAVENOUS

## 2015-12-24 MED ORDER — MENTHOL 3 MG MT LOZG
1.0000 | LOZENGE | OROMUCOSAL | Status: DC | PRN
  Filled 2015-12-24: qty 9

## 2015-12-24 MED ORDER — TRANEXAMIC ACID 1000 MG/10ML IV SOLN
1500.0000 mg | INTRAVENOUS | Status: AC
  Administered 2015-12-24: 1500 mg via INTRAVENOUS
  Filled 2015-12-24: qty 15

## 2015-12-24 MED ORDER — FENTANYL CITRATE (PF) 100 MCG/2ML IJ SOLN
INTRAMUSCULAR | Status: DC | PRN
  Administered 2015-12-24 (×4): 50 ug via INTRAVENOUS

## 2015-12-24 MED ORDER — ZOLPIDEM TARTRATE 5 MG PO TABS: 5.0000 mg | ORAL_TABLET | Freq: Every evening | ORAL | Status: DC | PRN

## 2015-12-24 MED ORDER — FENTANYL CITRATE (PF) 100 MCG/2ML IJ SOLN
INTRAMUSCULAR | Status: AC
  Administered 2015-12-24: 25 ug via INTRAVENOUS
  Filled 2015-12-24: qty 2

## 2015-12-24 MED ORDER — ACETAMINOPHEN 650 MG RE SUPP: 650.0000 mg | Freq: Four times a day (QID) | RECTAL | Status: DC | PRN

## 2015-12-24 MED ORDER — SODIUM CHLORIDE 0.9 % IV SOLN
INTRAVENOUS | Status: DC
  Administered 2015-12-24 – 2015-12-25 (×3): via INTRAVENOUS

## 2015-12-24 MED ORDER — CEFAZOLIN SODIUM-DEXTROSE 2-4 GM/100ML-% IV SOLN
2.0000 g | Freq: Three times a day (TID) | INTRAVENOUS | Status: AC
  Administered 2015-12-24 – 2015-12-25 (×3): 2 g via INTRAVENOUS
  Filled 2015-12-24 (×3): qty 100

## 2015-12-24 MED ORDER — NEOMYCIN-POLYMYXIN B GU 40-200000 IR SOLN
Status: DC | PRN
  Administered 2015-12-24: 4 mL

## 2015-12-24 MED ORDER — CEFAZOLIN SODIUM-DEXTROSE 2-4 GM/100ML-% IV SOLN
INTRAVENOUS | Status: AC
  Filled 2015-12-24: qty 100

## 2015-12-24 MED ORDER — METHOCARBAMOL 1000 MG/10ML IJ SOLN
500.0000 mg | Freq: Four times a day (QID) | INTRAVENOUS | Status: DC | PRN
  Filled 2015-12-24: qty 5

## 2015-12-24 MED ORDER — BUPIVACAINE-EPINEPHRINE (PF) 0.25% -1:200000 IJ SOLN
INTRAMUSCULAR | Status: DC | PRN
  Administered 2015-12-24: 30 mL via PERINEURAL

## 2015-12-24 MED ORDER — ENOXAPARIN SODIUM 40 MG/0.4ML ~~LOC~~ SOLN
40.0000 mg | SUBCUTANEOUS | Status: DC
  Administered 2015-12-25 – 2015-12-27 (×3): 40 mg via SUBCUTANEOUS
  Filled 2015-12-24 (×3): qty 0.4

## 2015-12-24 MED ORDER — CEFAZOLIN SODIUM-DEXTROSE 2-4 GM/100ML-% IV SOLN
2.0000 g | Freq: Once | INTRAVENOUS | Status: AC
  Administered 2015-12-24: 2 g via INTRAVENOUS

## 2015-12-24 MED ORDER — MORPHINE SULFATE (PF) 2 MG/ML IV SOLN
2.0000 mg | INTRAVENOUS | Status: DC | PRN
  Administered 2015-12-24 (×2): 2 mg via INTRAVENOUS
  Filled 2015-12-24 (×2): qty 1

## 2015-12-24 MED ORDER — MAGNESIUM CITRATE PO SOLN
1.0000 | Freq: Once | ORAL | Status: DC | PRN
  Filled 2015-12-24: qty 296

## 2015-12-24 MED ORDER — HYDROCHLOROTHIAZIDE 25 MG PO TABS
25.0000 mg | ORAL_TABLET | Freq: Every morning | ORAL | Status: DC
  Administered 2015-12-26 – 2015-12-27 (×2): 25 mg via ORAL
  Filled 2015-12-24 (×3): qty 1

## 2015-12-24 MED ORDER — PHENOL 1.4 % MT LIQD
1.0000 | OROMUCOSAL | Status: DC | PRN
  Filled 2015-12-24: qty 177

## 2015-12-24 MED ORDER — ACETAMINOPHEN 325 MG PO TABS: 650.0000 mg | ORAL_TABLET | Freq: Four times a day (QID) | ORAL | Status: DC | PRN

## 2015-12-24 MED ORDER — FAMOTIDINE 20 MG PO TABS
20.0000 mg | ORAL_TABLET | Freq: Once | ORAL | Status: AC
  Administered 2015-12-24: 20 mg via ORAL

## 2015-12-24 MED ORDER — BUPIVACAINE-EPINEPHRINE (PF) 0.25% -1:200000 IJ SOLN
INTRAMUSCULAR | Status: AC
  Filled 2015-12-24: qty 30

## 2015-12-24 MED ORDER — TRIAMCINOLONE ACETONIDE 0.1 % EX CREA
1.0000 "application " | TOPICAL_CREAM | Freq: Every day | CUTANEOUS | Status: DC | PRN
  Filled 2015-12-24: qty 15

## 2015-12-24 MED ORDER — METHOCARBAMOL 500 MG PO TABS: 500.0000 mg | ORAL_TABLET | Freq: Four times a day (QID) | ORAL | Status: DC | PRN

## 2015-12-24 MED ORDER — METOCLOPRAMIDE HCL 5 MG/ML IJ SOLN: 5.0000 mg | Freq: Three times a day (TID) | INTRAMUSCULAR | Status: DC | PRN

## 2015-12-24 MED ORDER — MAGNESIUM HYDROXIDE 400 MG/5ML PO SUSP
30.0000 mL | Freq: Every day | ORAL | Status: DC | PRN
  Administered 2015-12-26 – 2015-12-27 (×2): 30 mL via ORAL
  Filled 2015-12-24 (×2): qty 30

## 2015-12-24 MED ORDER — HYDROCODONE-ACETAMINOPHEN 7.5-325 MG PO TABS
1.0000 | ORAL_TABLET | ORAL | Status: DC | PRN
  Administered 2015-12-24: 2 via ORAL
  Administered 2015-12-24 – 2015-12-26 (×11): 1 via ORAL
  Filled 2015-12-24: qty 2
  Filled 2015-12-24: qty 1
  Filled 2015-12-24: qty 2
  Filled 2015-12-24 (×8): qty 1
  Filled 2015-12-24: qty 2
  Filled 2015-12-24: qty 1

## 2015-12-24 MED ORDER — LACTATED RINGERS IV SOLN
INTRAVENOUS | Status: DC
Start: 2015-12-24 — End: 2015-12-24
  Administered 2015-12-24: 15:00:00 via INTRAVENOUS
  Administered 2015-12-24: 50 mL/h via INTRAVENOUS

## 2015-12-24 MED ORDER — METOCLOPRAMIDE HCL 10 MG PO TABS
5.0000 mg | ORAL_TABLET | Freq: Three times a day (TID) | ORAL | Status: DC | PRN
Start: 2015-12-24 — End: 2015-12-27

## 2015-12-24 MED ORDER — FAMOTIDINE 20 MG PO TABS
ORAL_TABLET | ORAL | Status: AC
  Administered 2015-12-24: 20 mg via ORAL
  Filled 2015-12-24: qty 1

## 2015-12-24 MED ORDER — CETYLPYRIDINIUM CHLORIDE 0.05 % MT LIQD
7.0000 mL | Freq: Two times a day (BID) | OROMUCOSAL | Status: DC
  Administered 2015-12-25 – 2015-12-26 (×3): 7 mL via OROMUCOSAL

## 2015-12-24 MED ORDER — CEFAZOLIN SODIUM-DEXTROSE 2-4 GM/100ML-% IV SOLN
2.0000 g | Freq: Four times a day (QID) | INTRAVENOUS | Status: DC
  Filled 2015-12-24 (×3): qty 100

## 2015-12-24 MED ORDER — NEOMYCIN-POLYMYXIN B GU 40-200000 IR SOLN
Status: AC
  Filled 2015-12-24: qty 4

## 2015-12-24 MED ORDER — ONDANSETRON HCL 4 MG PO TABS: 4.0000 mg | ORAL_TABLET | Freq: Four times a day (QID) | ORAL | Status: DC | PRN

## 2015-12-24 MED ORDER — MECLIZINE HCL 25 MG PO TABS
25.0000 mg | ORAL_TABLET | Freq: Three times a day (TID) | ORAL | Status: DC | PRN
  Administered 2015-12-25: 25 mg via ORAL
  Filled 2015-12-24: qty 1

## 2015-12-24 MED ORDER — DOCUSATE SODIUM 100 MG PO CAPS
100.0000 mg | ORAL_CAPSULE | Freq: Two times a day (BID) | ORAL | Status: DC
  Administered 2015-12-24 – 2015-12-27 (×6): 100 mg via ORAL
  Filled 2015-12-24 (×6): qty 1

## 2015-12-24 MED ORDER — ONDANSETRON HCL 4 MG/2ML IJ SOLN
4.0000 mg | Freq: Four times a day (QID) | INTRAMUSCULAR | Status: DC | PRN
  Administered 2015-12-24 – 2015-12-25 (×3): 4 mg via INTRAVENOUS
  Filled 2015-12-24 (×3): qty 2

## 2015-12-24 MED ORDER — FENTANYL CITRATE (PF) 100 MCG/2ML IJ SOLN
25.0000 ug | INTRAMUSCULAR | Status: AC | PRN
  Administered 2015-12-24 (×6): 25 ug via INTRAVENOUS

## 2015-12-24 SURGICAL SUPPLY — 47 items
BLADE SAW SAG 18.5X105 (BLADE) ×3 IMPLANT
BNDG COHESIVE 6X5 TAN STRL LF (GAUZE/BANDAGES/DRESSINGS) ×3 IMPLANT
CANISTER SUCT 1200ML W/VALVE (MISCELLANEOUS) ×3 IMPLANT
CAPT HIP TOTAL 3 ×3 IMPLANT
CATH FOL LEG HOLDER (MISCELLANEOUS) ×3 IMPLANT
CATH TRAY METER 16FR LF (MISCELLANEOUS) ×3 IMPLANT
CHLORAPREP W/TINT 26ML (MISCELLANEOUS) ×3 IMPLANT
DRAPE C-ARM XRAY 36X54 (DRAPES) ×3 IMPLANT
DRAPE INCISE IOBAN 66X60 STRL (DRAPES) IMPLANT
DRAPE POUCH INSTRU U-SHP 10X18 (DRAPES) ×3 IMPLANT
DRAPE SHEET LG 3/4 BI-LAMINATE (DRAPES) ×9 IMPLANT
DRAPE STERI IOBAN 125X83 (DRAPES) ×3 IMPLANT
DRAPE TABLE BACK 80X90 (DRAPES) ×3 IMPLANT
DRSG OPSITE POSTOP 4X8 (GAUZE/BANDAGES/DRESSINGS) IMPLANT
ELECT BLADE 6.5 EXT (BLADE) ×3 IMPLANT
GAUZE SPONGE 4X4 12PLY STRL (GAUZE/BANDAGES/DRESSINGS) ×3 IMPLANT
GLOVE BIO SURGEON STRL SZ7 (GLOVE) ×6 IMPLANT
GLOVE BIOGEL PI IND STRL 9 (GLOVE) ×1 IMPLANT
GLOVE BIOGEL PI INDICATOR 9 (GLOVE) ×2
GLOVE SURG ORTHO 9.0 STRL STRW (GLOVE) ×6 IMPLANT
GOWN SRG 2XL LVL 4 RGLN SLV (GOWNS) ×1 IMPLANT
GOWN STRL NON-REIN 2XL LVL4 (GOWNS) ×2
GOWN STRL REUS W/ TWL LRG LVL3 (GOWN DISPOSABLE) ×1 IMPLANT
GOWN STRL REUS W/TWL LRG LVL3 (GOWN DISPOSABLE) ×2
HEMOVAC 400CC 10FR (MISCELLANEOUS) ×3 IMPLANT
HOOD PEEL AWAY FLYTE STAYCOOL (MISCELLANEOUS) ×3 IMPLANT
KIT PREVENA INCISION MGT 13 (CANNISTER) ×3 IMPLANT
MAT BLUE FLOOR 46X72 FLO (MISCELLANEOUS) ×3 IMPLANT
NDL SAFETY 18GX1.5 (NEEDLE) ×3 IMPLANT
NEEDLE SPNL 18GX3.5 QUINCKE PK (NEEDLE) ×3 IMPLANT
NS IRRIG 1000ML POUR BTL (IV SOLUTION) ×3 IMPLANT
PACK HIP COMPR (MISCELLANEOUS) ×3 IMPLANT
SOL PREP PVP 2OZ (MISCELLANEOUS) ×3
SOLUTION PREP PVP 2OZ (MISCELLANEOUS) ×1 IMPLANT
STAPLER SKIN PROX 35W (STAPLE) ×3 IMPLANT
STRAP SAFETY BODY (MISCELLANEOUS) ×3 IMPLANT
SUT DVC 2 QUILL PDO  T11 36X36 (SUTURE) ×2
SUT DVC 2 QUILL PDO T11 36X36 (SUTURE) ×1 IMPLANT
SUT DVC QUILL MONODERM 30X30 (SUTURE) ×3 IMPLANT
SUT SILK 0 (SUTURE) ×2
SUT SILK 0 30XBRD TIE 6 (SUTURE) ×1 IMPLANT
SUT VIC AB 1 CT1 36 (SUTURE) ×3 IMPLANT
SYR 20CC LL (SYRINGE) ×3 IMPLANT
SYR 30ML LL (SYRINGE) ×3 IMPLANT
TAPE MICROFOAM 4IN (TAPE) ×3 IMPLANT
TOWEL OR 17X26 4PK STRL BLUE (TOWEL DISPOSABLE) IMPLANT
TUBE KAMVAC SUCTION (TUBING) ×3 IMPLANT

## 2015-12-24 NOTE — Op Note (Signed)
12/24/2015  3:18 PM  PATIENT:  [name] [name]  65 y.o. female  PRE-OPERATIVE DIAGNOSIS:  RIGHT HIP PAIN, PRIMARY OSTEOARTHRITIS OF RIGHT HIP  POST-OPERATIVE DIAGNOSIS:  RIGHT HIP PAIN, PRIMARY OSTEOARTHRITIS OF RIGHT HIP  PROCEDURE:  Procedure(s): TOTAL HIP ARTHROPLASTY ANTERIOR APPROACH (Right)  SURGEON: Laurene Footman, MD  ASSISTANTS: None  ANESTHESIA:   spinal  EBL:  Total I/O In: 1000 [I.V.:1000] Out: 400 [Urine:100; Blood:300]  BLOOD ADMINISTERED:none  DRAINS: (2) Hemovact drain(s) in the Subcutaneous layer with  Suction Open   LOCAL MEDICATIONS USED:  MARCAINE     SPECIMEN:  Source of Specimen:  Right femoral head  DISPOSITION OF SPECIMEN:  PATHOLOGY  COUNTS:  YES  TOURNIQUET:  * No tourniquets in log *  IMPLANTS: Medacta AMIS  2 stem with a 52 mm Mpact cup DM with liner and S 28 head   DICTATION: .Dragon Dictation   The patient was brought to the operating room and after spinal anesthesia was obtained patient was placed on the operative table with the ipsilateral foot into the Medacta attachment, contralateral leg on a well-padded table. C-arm was brought in and preop template x-ray taken. After prepping and draping in usual sterile fashion appropriate patient identification and timeout procedures were completed. Anterior approach to the hip was obtained and centered over the greater trochanter and TFL muscle. The subcutaneous tissue was incised hemostasis being achieved by electrocautery. TFL fascia was incised and the muscle retracted laterally deep retractor placed. The lateral femoral circumflex vessels were identified and ligated. The anterior capsule was exposed and a capsulotomy performed. The neck was identified and a femoral neck cut carried out with a saw. The head was removed without difficulty and showed sclerotic femoral head and acetabulum. Reaming was carried out to 52 mm and a 52 mm cup trial gave appropriate tightness to the acetabular component a 52  cup was impacted into position. The leg was then externally rotated and ischiofemoral and pubofemoral releases carried out. The femur was sequentially broached to a size 2, size 2 standard stem with S head trials were placed and the final components chosen. The 2 standard stem was inserted along with a S 28 mm head and 52 mm liner. The hip was reduced and was stable the wound was thoroughly irrigated. The deep fascia was closed using a heavy Quill after infiltration of 30 cc of quarter percent Sensorcaine with epinephrine. Subcutaneous drains were then inserted with 2-0 Quill to close the skin with skin staples Provena wound VAC applied along with dressing over drain site   PLAN OF CARE: Admit to inpatient

## 2015-12-24 NOTE — Anesthesia Procedure Notes (Signed)
Performed by: Lance Muss Pre-anesthesia Checklist: Patient identified, Emergency Drugs available, Suction available, Patient being monitored and Timeout performed Patient Re-evaluated:Patient Re-evaluated prior to inductionOxygen Delivery Method: Simple face mask

## 2015-12-24 NOTE — Transfer of Care (Signed)
Immediate Anesthesia Transfer of Care Note  Patient: [name] [name]  Procedure(s) Performed: Procedure(s): TOTAL HIP ARTHROPLASTY ANTERIOR APPROACH (Right)  Patient Location: PACU  Anesthesia Type:Spinal  Level of Consciousness: confused and responds to stimulation  Airway & Oxygen Therapy: Patient Spontanous Breathing and Patient connected to face mask oxygen  Post-op Assessment: Report given to RN and Post -op Vital signs reviewed and stable  Post vital signs: Reviewed and stable  Last Vitals:  Vitals:   12/24/15 1201 12/24/15 1513  BP: (!) 136/91 96/63  Pulse: 62 66  Resp: 17 18  Temp: 36.3 C     Last Pain:  Vitals:   12/24/15 1201  TempSrc: Tympanic         Complications: No apparent anesthesia complications

## 2015-12-24 NOTE — Anesthesia Preprocedure Evaluation (Signed)
Anesthesia Evaluation  Patient identified by MRN, date of birth, ID band Patient awake    Reviewed: Allergy & Precautions, NPO status , Patient's Chart, lab work & pertinent test results, reviewed documented beta blocker date and time   History of Anesthesia Complications (+) PONV and history of anesthetic complications  Airway Mallampati: II  TM Distance: >3 FB     Dental  (+) Chipped, Upper Dentures, Partial Lower   Pulmonary shortness of breath,           Cardiovascular hypertension, Pt. on medications      Neuro/Psych    GI/Hepatic   Endo/Other    Renal/GU      Musculoskeletal  (+) Arthritis ,   Abdominal   Peds  Hematology  (+) anemia ,   Anesthesia Other Findings   Reproductive/Obstetrics                             Anesthesia Physical Anesthesia Plan  ASA: III  Anesthesia Plan: Spinal   Post-op Pain Management:    Induction:   Airway Management Planned:   Additional Equipment:   Intra-op Plan:   Post-operative Plan:   Informed Consent: I have reviewed the patients History and Physical, chart, labs and discussed the procedure including the risks, benefits and alternatives for the proposed anesthesia with the patient or authorized representative who has indicated his/her understanding and acceptance.     Plan Discussed with: CRNA  Anesthesia Plan Comments:         Anesthesia Quick Evaluation

## 2015-12-24 NOTE — H&P (Signed)
Reviewed paper H+P, will be scanned into chart. No changes noted.  

## 2015-12-24 NOTE — OR Nursing (Signed)
Left knee swollen and hard and patient states that it has been this way through 3 total knee replacements.  Denies any pain at surgical site or d/t swelling.  Unable to bend knee more than 45 degrees.  Family will be arriving later.

## 2015-12-24 NOTE — Anesthesia Procedure Notes (Signed)
Spinal  Patient location during procedure: OR Staffing Anesthesiologist: Gunnar Bulla Performed: anesthesiologist  Preanesthetic Checklist Completed: patient identified, site marked, surgical consent, pre-op evaluation, timeout performed, IV checked and risks and benefits discussed Spinal Block Patient position: sitting Prep: Betadine Patient monitoring: heart rate, cardiac monitor, continuous pulse ox and blood pressure Approach: midline Location: L3-4 Injection technique: single-shot Needle Needle type: Pencil-Tip  Needle gauge: 25 G Needle length: 9 cm Assessment Sensory level: T10 Additional Notes 1316 marcaine 12.5mg .

## 2015-12-25 ENCOUNTER — Encounter: Payer: Self-pay | Admitting: Orthopedic Surgery

## 2015-12-25 LAB — CBC
HCT: 33.3 % — ABNORMAL LOW (ref 35.0–47.0)
HEMOGLOBIN: 11.2 g/dL — AB (ref 12.0–16.0)
MCH: 29 pg (ref 26.0–34.0)
MCHC: 33.5 g/dL (ref 32.0–36.0)
MCV: 86.4 fL (ref 80.0–100.0)
Platelets: 270 10*3/uL (ref 150–440)
RBC: 3.85 MIL/uL (ref 3.80–5.20)
RDW: 14.7 % — ABNORMAL HIGH (ref 11.5–14.5)
WBC: 9.5 10*3/uL (ref 3.6–11.0)

## 2015-12-25 LAB — BASIC METABOLIC PANEL
Anion gap: 5 (ref 5–15)
BUN: 10 mg/dL (ref 6–20)
CO2: 29 mmol/L (ref 22–32)
CREATININE: 0.61 mg/dL (ref 0.44–1.00)
Calcium: 7.9 mg/dL — ABNORMAL LOW (ref 8.9–10.3)
Chloride: 104 mmol/L (ref 101–111)
GLUCOSE: 123 mg/dL — AB (ref 65–99)
POTASSIUM: 3.4 mmol/L — AB (ref 3.5–5.1)
SODIUM: 138 mmol/L (ref 135–145)

## 2015-12-25 MED ORDER — POTASSIUM CHLORIDE 20 MEQ PO PACK
20.0000 meq | PACK | Freq: Three times a day (TID) | ORAL | Status: AC
  Administered 2015-12-25 (×2): 20 meq via ORAL
  Filled 2015-12-25 (×4): qty 1

## 2015-12-25 NOTE — Evaluation (Signed)
Occupational Therapy Evaluation Patient Details Name: [name] [name] MRN: [id number] DOB: 12/05/1950 Today's Date: 12/25/2015    History of Present Illness Pt is a 65 yr old female who underwent a right THA 8/22 secondary to hip OA. PMH includes: HTN, anemia, arthritis, post anesthesia vertigo, and h/o C4-5/5-6 fusion, L TKA with several revisions/manipulation under anesthesia, and R carpal tunnel release.   Clinical Impression   Pt. Is a 65 y.o. female who was admitted to [facility] for a right THR(anterior approach). Pt. Presents with pain, post-op vertigo, dizziness, limited ROM, weakness, limited activity tolerance, and impaired functional mobility for ADLs. Pt. Could benefit from skilled OT services for ADL and A/E training, UE there. Ex, functional mobility, and pt. Education about home modification in order to work towards returning to her PLOF.    Follow Up Recommendations  Home health OT    Equipment Recommendations       Recommendations for Other Services PT consult     Precautions / Restrictions Precautions Precautions: Fall;Anterior Hip Restrictions Weight Bearing Restrictions: Yes RLE Weight Bearing: Weight bearing as tolerated              ADL Overall ADL's : Needs assistance/impaired Eating/Feeding: Set up   Grooming: Set up               Lower Body Dressing: Maximal assistance               Functional mobility during ADLs:  (Deferred secondary to post-op vertigo, dizziness, and nausea.) General ADL Comments: Pt. education was provided about A/E use for LE dressing skills. Verbal and visual demonstration was provided.       Vision     Perception     Praxis      Pertinent Vitals/Pain Pain Assessment: 0-10 Pain Score: 4  Pain Location: Right hip Pain Descriptors / Indicators: Aching Pain Intervention(s): Limited activity within patient's tolerance;Monitored during session     Hand Dominance Right   Extremity/Trunk Assessment  Upper Extremity Assessment Upper Extremity Assessment: Overall WFL for tasks assessed   Cervical / Trunk Assessment Cervical / Trunk Assessment: Normal   Communication Communication Communication: No difficulties   Cognition Arousal/Alertness: Awake/alert Behavior During Therapy: WFL for tasks assessed/performed Overall Cognitive Status: Within Functional Limits for tasks assessed                     General Comments       Exercises     Shoulder Instructions      Home Living Family/patient expects to be discharged to:: Private residence Living Arrangements: Alone Available Help at Discharge: Family;Friend(s);Available 24 hours/day Type of Home: House Home Access: Ramped entrance     Home Layout: One level     Bathroom Shower/Tub: Tub/shower unit Shower/tub characteristics: Curtain Biochemist, clinical: Standard     Home Equipment: Environmental consultant - 2 wheels;Cane - single point;Toilet riser;Grab bars - tub/shower          Prior Functioning/Environment Level of Independence: Independent with assistive device(s)        Comments: Household and limited community mobility with single point cane. Fairly active; takes care of all ADLs and housework herself.    OT Diagnosis: Generalized weakness;Acute pain   OT Problem List: Decreased strength;Decreased range of motion;Decreased activity tolerance;Decreased safety awareness;Decreased knowledge of use of DME or AE   OT Treatment/Interventions: Self-care/ADL training;Therapeutic exercise;DME and/or AE instruction;Patient/family education;Therapeutic activities    OT Goals(Current goals can be found in the care plan section) Acute Rehab  OT Goals Patient Stated Goal: To go home. OT Goal Formulation: With patient  OT Frequency: Min 1X/week   Barriers to D/C:            Co-evaluation              End of Session    Activity Tolerance: Patient tolerated treatment well Patient left: in chair;with call bell/phone  within reach;with chair alarm set   Time: 1050-1110 OT Time Calculation (min): 20 min Charges:  OT General Charges $OT Visit: 1 Procedure OT Evaluation $OT Eval Low Complexity: 1 Procedure G-Codes:    Harrel Carina, MS, OTR/L 12/25/2015, 11:57 AM

## 2015-12-25 NOTE — Care Management Note (Addendum)
Case Management Note  Patient Details  Name: RAYCHELL KNAPKE MRN: [id number] Date of Birth: [birth year]  Subjective/Objective:        Spoke with patient for discharge plan.P atient is alert and oriented from home alone. Will ahave the assistance of her two sisters after discharge.Patient has a walker a BSC and a ramp at her home. Is familiar with Lovenox injections from past Knee Replacement. Pharmacy is [address]. Lovenox 40mg  injection daily for 14 days no refills called in. Patient stated that she feels comfortable going home and has support for transportation and help with getting her meds filled. Choice of HH provdiers given and patient chose [facility]. Referral was placed with Corliss Blacker.  [facility] will call me pack with drug co pay information. Action/Plan:Anticipated discharge to home with [facility].  Expected Discharge Date:                  Expected Discharge Plan:  [place]  In-House Referral:     Discharge planning Services  CM Consult  Post Acute Care Choice:  NA Choice offered to:  Patient  DME Arranged:  N/A DME Agency:  [facility] (now [facility])  [place] Arranged:  PT, OT [place] Agency:     Status of Service:  Completed, signed off  If discussed at [employer] of Stay Meetings, dates discussed:    Additional Comments: [place] for lovenox is $60  Alvie Heidelberg, RN 12/25/2015, 2:30 PM

## 2015-12-25 NOTE — Progress Notes (Signed)
   Subjective: 1 Day Post-Op Procedure(s) (LRB): TOTAL HIP ARTHROPLASTY ANTERIOR APPROACH (Right) Patient reports pain as mild.   Patient is well, and has had no acute complaints or problems Denies any CP, SOB, ABD pain. We will continue therapy today.  Plan is to go Home after hospital stay.  Objective: Vital signs in last 24 hours: Temp:  [97.1 F (36.2 C)-97.8 F (36.6 C)] 97.8 F (36.6 C) (08/23 0331) Pulse Rate:  [51-79] 60 (08/23 0331) Resp:  [14-19] 18 (08/23 0331) BP: (96-151)/(57-91) 112/66 (08/23 0331) SpO2:  [95 %-100 %] 100 % (08/23 0331) FiO2 (%):  [28 %] 28 % (08/22 1630) Weight:  [104.3 kg (230 lb)] 104.3 kg (230 lb) (08/22 1201)  Intake/Output from previous day: 08/22 0701 - 08/23 0700 In: 1701.7 [P.O.:480; I.V.:1221.7] Out: 750 [Urine:450; Blood:300] Intake/Output this shift: Total I/O In: -  Out: 325 [Urine:325]   Recent Labs  12/24/15 1833 12/25/15 0506  HGB 11.9* 11.2*    Recent Labs  12/24/15 1833 12/25/15 0506  WBC 14.6* 9.5  RBC 4.18 3.85  HCT 35.7 33.3*  PLT 276 270    Recent Labs  12/24/15 1833 12/25/15 0506  NA  --  138  K  --  3.4*  CL  --  104  CO2  --  29  BUN  --  10  CREATININE 0.68 0.61  GLUCOSE  --  123*  CALCIUM  --  7.9*   No results for input(s): LABPT, INR in the last 72 hours.  EXAM General - Patient is Alert, Appropriate and Oriented Extremity - Neurovascular intact Sensation intact distally Intact pulses distally Dorsiflexion/Plantar flexion intact No cellulitis present Compartment soft Dressing - dressing C/D/I and wound vac and hemovac intact Motor Function - intact, moving foot and toes well on exam.   Past Medical History:  Diagnosis Date  . Arthritis   . Complication of anesthesia    terrible vertigo  . Hypertension   . PONV (postoperative nausea and vomiting)    related to vertigo  . Shortness of breath    WITH EXERTION  . Vertigo    hx post op    Assessment/Plan:   1 Day Post-Op  Procedure(s) (LRB): TOTAL HIP ARTHROPLASTY ANTERIOR APPROACH (Right) Active Problems:   Primary localized osteoarthritis of right hip   hypokalemia Estimated body mass index is 33 kg/m as calculated from the following:   Height as of this encounter: 5\' 10"  (1.778 m).   Weight as of this encounter: 104.3 kg (230 lb). Advance diet Up with therapy  Needs BM CM to assist with discharge Recheck labs in the am Oral K+ today, recheck K in the am  DVT Prophylaxis - Lovenox, Foot Pumps and TED hose Weight-Bearing as tolerated to right leg   T. Rachelle Hora, PA-C [place] 12/25/2015, 6:48 AM

## 2015-12-25 NOTE — Progress Notes (Signed)
Clinical Social Worker (CSW) received SNF consult. PT is recommending home health. RN Case Manager is aware of above. Please reconsult if future social work needs arise. CSW signing off.   Verle Brillhart, LCSW [phone] 

## 2015-12-25 NOTE — Anesthesia Postprocedure Evaluation (Signed)
Anesthesia Post Note  Patient: Janeka A Coaxum  Procedure(s) Performed: Procedure(s) (LRB): TOTAL HIP ARTHROPLASTY ANTERIOR APPROACH (Right)  Patient location during evaluation: Nursing Unit Anesthesia Type: Spinal Level of consciousness: awake, awake and alert and oriented Pain management: pain level controlled Vital Signs Assessment: post-procedure vital signs reviewed and stable Respiratory status: spontaneous breathing, nonlabored ventilation and respiratory function stable Cardiovascular status: blood pressure returned to baseline and stable Postop Assessment: no headache and no backache Anesthetic complications: no    Last Vitals:  Vitals:   12/25/15 0002 12/25/15 0331  BP: (!) 103/57 112/66  Pulse: (!) 51 60  Resp: 16 18  Temp: 36.6 C 36.6 C    Last Pain:  Vitals:   12/25/15 0331  TempSrc: Oral  PainSc:                  Johnna Acosta

## 2015-12-25 NOTE — Progress Notes (Signed)
Physical Therapy Treatment Patient Details Name: [name] [name] MRN: [id number] DOB: November 14, 1950 Today's Date: 12/25/2015    History of Present Illness Pt is a 65 yr old female who underwent a right THA 8/22 secondary to hip OA. PMH includes: HTN, anemia, arthritis, post anesthesia vertigo, and h/o C4-5/5-6 fusion, L TKA with several revisions/manipulation under anesthesia, and R carpal tunnel release.    PT Comments    Pt continues up in chair. Pt with complains of severe lightheadedness and nausea. Nursing notes pt given medicine for lightheadedness; pt notes no improvement. Pt does wish to participate and performs long sit exercises with assist and rest breaks as needed. Pt wishes to remain in chair, as movement makes current symptoms worse. Continue PT to progress range, strength and endurance with progression to ambulation and improved functional mobility.   Follow Up Recommendations  Home health PT     Equipment Recommendations  None recommended by PT    Recommendations for Other Services       Precautions / Restrictions Precautions Precautions: Fall;Anterior Hip Restrictions Weight Bearing Restrictions: Yes RLE Weight Bearing: Weight bearing as tolerated    Mobility  Bed Mobility Overal bed mobility: Needs Assistance Bed Mobility: Supine to Sit     Supine to sit: Min assist     General bed mobility comments: Not tested; up in chair and wishes to remain in chair  Transfers Overall transfer level: Needs assistance Equipment used: Rolling walker (2 wheeled) Transfers: Sit to/from Stand Sit to Stand: Min assist         General transfer comment: Not tested; pt with complaints of severe lightheadedness and nausea  Ambulation/Gait Ambulation/Gait assistance: Min guard Ambulation Distance (Feet): 3 Feet Assistive device: Rolling walker (2 wheeled) Gait Pattern/deviations: Antalgic     General Gait Details: Pt able to perform pivot steps x 3 feet from EOB >  chair using RW and min guard for safety. Pt grunting in pain but only endorses R hip pain to be 4/10 with WBing. Increased time required.   Stairs            Wheelchair Mobility    Modified Rankin (Stroke Patients Only)       Balance Overall balance assessment: Needs assistance Sitting-balance support: Feet supported Sitting balance-Leahy Scale: Good Sitting balance - Comments: Pt able to tolerate bout of vomiting, holding pink tub with BUE. Close standby from PT.   Standing balance support: Bilateral upper extremity supported (on RW) Standing balance-Leahy Scale: Fair Standing balance comment: Pt with posterior and leftward lean to offweight RLE but manages with BUE assist and no LOB.                    Cognition Arousal/Alertness: Lethargic Behavior During Therapy: WFL for tasks assessed/performed Overall Cognitive Status: Within Functional Limits for tasks assessed                      Exercises Total Joint Exercises Ankle Circles/Pumps: AROM;Both;20 reps Quad Sets: Strengthening;Both;20 reps Gluteal Sets: Strengthening;Both;20 reps Towel Squeeze: Strengthening;Both;20 reps Short Arc Quad: AROM;Both;20 reps Heel Slides: AAROM;Right;20 reps (L AROM) Hip ABduction/ADduction: AAROM;Right;20 reps (AROM L) Straight Leg Raises: AROM;AAROM    General Comments General comments (skin integrity, edema, etc.): Wound vac and hemovac intact and draining RLE.       Pertinent Vitals/Pain Pain Assessment: 0-10 Pain Score: 5  Pain Location: R hip Pain Descriptors / Indicators: Aching Pain Intervention(s): Limited activity within patient's tolerance    Home  Living Family/patient expects to be discharged to:: Private residence Living Arrangements: Alone Available Help at Discharge: Family;Friend(s);Available 24 hours/day Type of Home: House Home Access: Ramped entrance   Home Layout: One level Home Equipment: Walker - 2 wheels;Cane - single point;Toilet  riser;Grab bars - tub/shower      Prior Function Level of Independence: Independent with assistive device(s)      Comments: Household and limited community mobility with single point cane. Fairly active; takes care of all ADLs and housework herself.   PT Goals (current goals can now be found in the care plan section) Acute Rehab PT Goals Patient Stated Goal: To go home. PT Goal Formulation: With patient Time For Goal Achievement: 01/08/16 Potential to Achieve Goals: Good Progress towards PT goals: Progressing toward goals (slow due to lightheadedness)    Frequency  BID    PT Plan Current plan remains appropriate (pt will need to demonstrate ambulation)    Co-evaluation             End of Session Equipment Utilized During Treatment: Gait belt Activity Tolerance: Patient limited by fatigue;Patient limited by pain;Other (comment) (by nausea/lightheadedness) Patient left: in chair;with chair alarm set;with SCD's reapplied;with call bell/phone within reach     Time: [id number] PT Time Calculation (min) (ACUTE ONLY): 26 min  Charges:  $Therapeutic Exercise: 23-37 mins                    G CodesCharlaine Dalton, PTA 12/25/2015, 3:04 PM

## 2015-12-25 NOTE — Evaluation (Signed)
Physical Therapy Evaluation Patient Details Name: [name] [name] MRN: [id number] DOB: 1951/01/23 Today's Date: 12/25/2015   History of Present Illness  Pt is a 65 yr old female s/p R THA 8/22 secondary to hip OA. PMH significant for HTN, anemia, arthritis, post anesthesia vertigo, and h/o C4-5/5-6 fusion, L TKA with several revisions/manipulation under anesthesia, and R carpal tunnel release.    Clinical Impression  Prior to admission, pt was mod I with functional mobility and ADLs using a single point cane.  Pt lives alone in a ramped-entry, one-story home; has family and friends nearby who will be available to assist PRN upon d/c.  Currently, pt is min A for bed mobility and sit <> stand transfers, and min guard for ambulation x3ft EOB > chair using RW.  Pt has personal RW in room which is fit for her height and safe for use.  Pt limited this session by post-operative nausea (vomiting x ~5 minutes upon sitting EOB).  Pt would benefit from skilled PT to address noted impairments and functional limitations.  Recommend pt discharge home with HHPT and intermittent supervision/assist when medically appropriate.     Follow Up Recommendations Home health PT; intermittent supervision/assist    Equipment Recommendations  None recommended by PT (pt owns necessary equipment)    Recommendations for Other Services       Precautions / Restrictions Precautions Precautions: Fall;Anterior Hip Restrictions Weight Bearing Restrictions: Yes RLE Weight Bearing: Weight bearing as tolerated      Mobility  Bed Mobility Overal bed mobility: Needs Assistance Bed Mobility: Supine to Sit Supine to sit: Min assist General bed mobility comments: Min A to assist RLE off EOB. Increased time and UE assist on bed rails. Pt with episode of vomiting sitting EOB.  Transfers Overall transfer level: Needs assistance Equipment used: Rolling walker (2 wheeled) Transfers: Sit to/from Stand Sit to Stand: Min  assist General transfer comment: Pt utilizes forward weight shift x3 for momentum to assume standing at RW. BUE assist and min A from PT for power up to stand. Increased time. Decreased WBing through RLE.   Ambulation/Gait Ambulation/Gait assistance: Min guard Ambulation Distance (Feet): 3 Feet Assistive device: Rolling walker (2 wheeled) Gait Pattern/deviations: Antalgic General Gait Details: Pt able to perform pivot steps x 3 feet from EOB > chair using RW and min guard for safety. Pt grunting in pain but only endorses R hip pain to be 4/10 with WBing. Increased time required.  Stairs      Balance Overall balance assessment: Needs assistance Sitting-balance support: Feet supported Sitting balance-Leahy Scale: Good Sitting balance - Comments: Pt able to tolerate bout of vomiting, holding pink tub with BUE. Close standby from PT. Standing balance support: Bilateral upper extremity supported (on RW) Standing balance-Leahy Scale: Fair Standing balance comment: Pt with posterior and leftward lean to offweight RLE but manages with BUE assist and no LOB.      Pertinent Vitals/Pain Pain Assessment: 0-10 Pain Score: 4  (with mobility, "maybe a 1/10" at rest) Pain Location: R hip Pain Descriptors / Indicators: Aching Pain Intervention(s): Limited activity within patient's tolerance;Monitored during session;Premedicated before session;Repositioned;Ice applied  HR and O2 monitored throughout session and maintained WFL.     Home Living Family/patient expects to be discharged to:: Private residence Living Arrangements: Alone Available Help at Discharge: Family;Friend(s);Available 24 hours/day Type of Home: House Home Access: Ramped entrance Home Layout: One level Home Equipment: Walker - 2 wheels;Cane - single point;Toilet riser;Grab bars - tub/shower    Prior Function  Level of Independence: Independent with assistive device(s)  Comments: Household and limited community mobility with  single point cane. Fairly active; takes care of all ADLs and housework herself.        Extremity/Trunk Assessment   Upper Extremity Assessment: Overall WFL for tasks assessed  Lower Extremity Assessment: Generalized weakness;LLE deficits/detail LLE Deficits / Details: Limited L knee ROM which is chronic in nature. L knee extension in supine with overpressure to -8 deg from neutral. L knee flexion in sitting 67 deg.  Cervical / Trunk Assessment: Normal    Communication   Communication: No difficulties  Cognition Arousal/Alertness: Awake/alert Behavior During Therapy: WFL for tasks assessed/performed Overall Cognitive Status: Within Functional Limits for tasks assessed    General Comments General comments (skin integrity, edema, etc.): Wound vac and hemovac intact and draining RLE.     Exercises Total Joint Exercises Ankle Circles/Pumps: AROM Quad Sets: AROM Gluteal Sets: AROM Short Arc Quad: AROM Heel Slides: AROM LLE (limited range d/t limited knee flexion);AAROM RLE Hip ABduction/ADduction: AROM LLE;AAROM RLE Straight Leg Raises: AROM LLE;AAROM RLE  All exercises performed bilaterally x 10 reps in supine.       Assessment/Plan    PT Assessment Patient needs continued PT services  PT Diagnosis Difficulty walking;Generalized weakness   PT Problem List Decreased strength;Decreased range of motion;Decreased activity tolerance;Decreased balance;Decreased mobility  PT Treatment Interventions DME instruction;Gait training;Functional mobility training;Therapeutic exercise;Therapeutic activities;Balance training;Patient/family education   PT Goals (Current goals can be found in the Care Plan section) Acute Rehab PT Goals Patient Stated Goal: To go home PT Goal Formulation: With patient Time For Goal Achievement: 01/08/16 Potential to Achieve Goals: Good    Frequency BID   Barriers to discharge           End of Session Equipment Utilized During Treatment: Gait  belt Activity Tolerance: Patient tolerated treatment well (Patient limited by nausea) Patient left: in chair;with call bell/phone within reach;with chair alarm set;with SCD's reapplied (bilat heels elevated via pillow, ice pack to R hip) Nurse Communication: Mobility status;Precautions;Weight bearing status         Time: [id number] PT Time Calculation (min) (ACUTE ONLY): 37 min   Charges:         PT G Codes:        Clara Herbison, SPT 12/25/2015, 11:26 AM

## 2015-12-26 LAB — BASIC METABOLIC PANEL
Anion gap: 4 — ABNORMAL LOW (ref 5–15)
BUN: 7 mg/dL (ref 6–20)
CHLORIDE: 109 mmol/L (ref 101–111)
CO2: 29 mmol/L (ref 22–32)
CREATININE: 0.57 mg/dL (ref 0.44–1.00)
Calcium: 8 mg/dL — ABNORMAL LOW (ref 8.9–10.3)
GFR calc Af Amer: >60 mL/min (ref >60)
Glucose, Bld: 132 mg/dL — ABNORMAL HIGH (ref 65–99)
Potassium: 3.4 mmol/L — ABNORMAL LOW (ref 3.5–5.1)
SODIUM: 142 mmol/L (ref 135–145)

## 2015-12-26 LAB — CBC
HCT: 32.2 % — ABNORMAL LOW (ref 35.0–47.0)
Hemoglobin: 10.8 g/dL — ABNORMAL LOW (ref 12.0–16.0)
MCH: 28.9 pg (ref 26.0–34.0)
MCHC: 33.4 g/dL (ref 32.0–36.0)
MCV: 86.4 fL (ref 80.0–100.0)
PLATELETS: 240 10*3/uL (ref 150–440)
RBC: 3.73 MIL/uL — ABNORMAL LOW (ref 3.80–5.20)
RDW: 14.5 % (ref 11.5–14.5)
WBC: 8.4 10*3/uL (ref 3.6–11.0)

## 2015-12-26 LAB — SURGICAL PATHOLOGY

## 2015-12-26 MED ORDER — POTASSIUM CHLORIDE 20 MEQ PO PACK
20.0000 meq | PACK | Freq: Three times a day (TID) | ORAL | Status: AC
  Administered 2015-12-26 (×3): 20 meq via ORAL
  Filled 2015-12-26 (×2): qty 1

## 2015-12-26 NOTE — Progress Notes (Signed)
Physical Therapy Treatment Patient Details Name: SHARYON JANCO MRN: [id number] DOB: 21-Jan-1951 Today's Date: 12/26/2015    History of Present Illness Pt is a 65 yr old female who underwent a right THA 8/22 secondary to hip OA. PMH includes: HTN, anemia, arthritis, post anesthesia vertigo, and h/o C4-5/5-6 fusion, L TKA with several revisions/manipulation under anesthesia, and R carpal tunnel release.    PT Comments    Pt agreeable to PT; pt notes much less dizziness today to almost no dizziness. Pt has difficulty with transfers more so due to left knee issues from prior surgeries. Very slow to rise/sit. Pt progresses with ambulation today; however, ambulation is extremely slow and effortful due to pain, weakness and poor range throughout Bilateral lower extremities. Plan to see pt this afternoon for continued work on functional mobility.   Follow Up Recommendations  Home health PT (versus SNF if ambulation does not improve)     Equipment Recommendations  None recommended by PT    Recommendations for Other Services       Precautions / Restrictions Precautions Precautions: Fall;Anterior Hip Restrictions Weight Bearing Restrictions: Yes RLE Weight Bearing: Weight bearing as tolerated    Mobility  Bed Mobility               General bed mobility comments: Not tested; put up in chair  Transfers Overall transfer level: Needs assistance Equipment used: Rolling walker (2 wheeled) Transfers: Sit to/from Stand Sit to Stand: Min assist         General transfer comment: slow to rise/sit. Pt has additional complications making transfer effortful due to 3 previous L knee replacement with poor result  Ambulation/Gait Ambulation/Gait assistance: Min guard Ambulation Distance (Feet): 50 Feet Assistive device: Rolling walker (2 wheeled) Gait Pattern/deviations: Step-through pattern;Decreased step length - right;Decreased stance time - right;Decreased weight shift to  right;Trunk flexed (difficulty weightearing on left as well) Gait velocity: very slow Gait velocity interpretation: <1.8 ft/sec, indicative of risk for recurrent falls (0.125 ft per second) General Gait Details: Pt ambulates extremely slow, but gives good effort. Pt with stiff leg R mobility and slow advancement. LLE with difficulty weight bearing on L and attaining L ankle neutral as well as L knee extension due to prior surgeries.    Stairs            Wheelchair Mobility    Modified Rankin (Stroke Patients Only)       Balance Overall balance assessment: Needs assistance Sitting-balance support: Feet supported Sitting balance-Leahy Scale: Good     Standing balance support: Bilateral upper extremity supported Standing balance-Leahy Scale: Fair                      Cognition Arousal/Alertness: Awake/alert Behavior During Therapy: WFL for tasks assessed/performed Overall Cognitive Status: Within Functional Limits for tasks assessed                      Exercises      General Comments        Pertinent Vitals/Pain Pain Assessment: 0-10 Pain Score: 4  Pain Location: R hip Pain Intervention(s): Monitored during session;Premedicated before session    Home Living                      Prior Function            PT Goals (current goals can now be found in the care plan section) Progress towards PT goals: Progressing toward goals (  slowly)    Frequency  BID    PT Plan Current plan remains appropriate    Co-evaluation             End of Session   Activity Tolerance: Patient limited by fatigue (weakness; difficulty with function of non surgical leg) Patient left: in chair;with call bell/phone within reach;with chair alarm set     Time: [id number] PT Time Calculation (min) (ACUTE ONLY): 20 min  Charges:  $Gait Training: 8-22 mins                    G Codes:      Charlaine Dalton, PTA 12/26/2015, 1:32 PM

## 2015-12-26 NOTE — Progress Notes (Signed)
   Subjective: 2 Days Post-Op Procedure(s) (LRB): TOTAL HIP ARTHROPLASTY ANTERIOR APPROACH (Right) Patient reports pain as 0 on 0-10 scale with lying in bed. Patient is well, and has had no acute complaints or problems. Yesterday, patient noted to have dizziness and lightheadedness with standing. She states this has resolved today. Denies any CP, SOB, ABD pain. We will continue therapy today.  Plan is to go Home after hospital stay.  Objective: Vital signs in last 24 hours: Temp:  [97.6 F (36.4 C)-99 F (37.2 C)] 99 F (37.2 C) (08/24 0413) Pulse Rate:  [58-80] 76 (08/24 0413) Resp:  [18-19] 19 (08/24 0413) BP: (102-129)/(61-70) 102/64 (08/24 0413) SpO2:  [94 %-100 %] 94 % (08/24 0413)  Intake/Output from previous day: 08/23 0701 - 08/24 0700 In: 3551.7 [P.O.:840; I.V.:2711.7] Out: 70 [Drains:70] Intake/Output this shift: No intake/output data recorded.   Recent Labs  12/24/15 1833 12/25/15 0506 12/26/15 0452  HGB 11.9* 11.2* 10.8*    Recent Labs  12/25/15 0506 12/26/15 0452  WBC 9.5 8.4  RBC 3.85 3.73*  HCT 33.3* 32.2*  PLT 270 240    Recent Labs  12/25/15 0506 12/26/15 0452  NA 138 142  K 3.4* 3.4*  CL 104 109  CO2 29 29  BUN 10 7  CREATININE 0.61 0.57  GLUCOSE 123* 132*  CALCIUM 7.9* 8.0*   No results for input(s): LABPT, INR in the last 72 hours.  EXAM General - Patient is Alert, Appropriate and Oriented Extremity - Neurovascular intact Sensation intact distally Intact pulses distally Dorsiflexion/Plantar flexion intact No cellulitis present Compartment soft Dressing - dressing C/D/I and Hemovac removed. Wound VAC intact Motor Function - intact, moving foot and toes well on exam.   Past Medical History:  Diagnosis Date  . Arthritis   . Complication of anesthesia    terrible vertigo  . Hypertension   . PONV (postoperative nausea and vomiting)    related to vertigo  . Shortness of breath    WITH EXERTION  . Vertigo    hx post op     Assessment/Plan:   2 Days Post-Op Procedure(s) (LRB): TOTAL HIP ARTHROPLASTY ANTERIOR APPROACH (Right) Active Problems:   Primary localized osteoarthritis of right hip   hypokalemia Estimated body mass index is 33 kg/m as calculated from the following:   Height as of this encounter: 5\' 10"  (1.778 m).   Weight as of this encounter: 104.3 kg (230 lb). Advance diet Up with therapy  Needs BM CM to assist with discharge Recheck labs in the am Hypokalemia, stable, continue with oral potassium supplementation  DVT Prophylaxis - Lovenox, Foot Pumps and TED hose Weight-Bearing as tolerated to right leg   T. Rachelle Hora, PA-C [facility] 12/26/2015, 7:44 AM

## 2015-12-26 NOTE — Progress Notes (Signed)
Physical Therapy Treatment Patient Details Name: [name] [name] MRN: [id number] DOB: March 20, 1951 Today's Date: 12/26/2015    History of Present Illness Pt is a 65 yr old female who underwent a right THA 8/22 secondary to hip OA. PMH includes: HTN, anemia, arthritis, post anesthesia vertigo, and h/o C4-5/5-6 fusion, L TKA with several revisions/manipulation under anesthesia, and R carpal tunnel release.    PT Comments    Pt agreeable to PT; pain tolerable. Pt demonstrates much improved technique and control with sit to/from stand transfers relying more so on Right lower extremity than non surgical side. Pt also demonstrates much improved ambulation distance and speed although still fall risk level. Pt has ramp and one level living. Pt recommendation may continue as home with home health PT, if function continues/improves tomorrow. Continue PT to progress strength and all functional mobility to allow an optimal, safe return home.   Follow Up Recommendations  Home health PT (versus SNF if ambulation does not improve)     Equipment Recommendations  None recommended by PT    Recommendations for Other Services       Precautions / Restrictions Precautions Precautions: Fall;Anterior Hip Restrictions Weight Bearing Restrictions: Yes RLE Weight Bearing: Weight bearing as tolerated    Mobility  Bed Mobility               General bed mobility comments: Not tested; put up in chair  Transfers Overall transfer level: Needs assistance Equipment used: Rolling walker (2 wheeled) Transfers: Sit to/from Stand Sit to Stand: Min guard         General transfer comment: slow to rise/sit, but much improved from morning session. L knee bend causes greatest limitation. Pt relies more on RLE for elevation/deceleration  Ambulation/Gait Ambulation/Gait assistance: Supervision Ambulation Distance (Feet): 100 Feet Assistive device: Rolling walker (2 wheeled) Gait Pattern/deviations:  Step-through pattern;Trunk flexed Gait velocity: decreased Gait velocity interpretation: <1.8 ft/sec, indicative of risk for recurrent falls (0.66 ft/sec) General Gait Details: Pt continues to ambulate slowly, but much improved from morning session. Good reciprocal patten and no LOB   Stairs            Wheelchair Mobility    Modified Rankin (Stroke Patients Only)       Balance Overall balance assessment: Needs assistance Sitting-balance support: Feet supported Sitting balance-Leahy Scale: Good     Standing balance support: Bilateral upper extremity supported Standing balance-Leahy Scale: Fair                      Cognition Arousal/Alertness: Awake/alert Behavior During Therapy: WFL for tasks assessed/performed Overall Cognitive Status: Within Functional Limits for tasks assessed                      Exercises Total Joint Exercises Ankle Circles/Pumps: AROM;Both;20 reps Quad Sets: Strengthening;Both;20 reps Gluteal Sets: Strengthening;Both;20 reps Towel Squeeze: Strengthening;Both;20 reps Long Arc Quad: AAROM;Both;20 reps;Seated Marching in Standing: AAROM;Right;20 reps;Seated (AROM L)    General Comments        Pertinent Vitals/Pain Pain Assessment: 0-10 Pain Score: 4  Pain Location: R hip/thigh Pain Intervention(s): Monitored during session    Home Living                      Prior Function            PT Goals (current goals can now be found in the care plan section) Progress towards PT goals: Progressing toward goals    Frequency  BID    PT Plan Current plan remains appropriate    Co-evaluation             End of Session   Activity Tolerance: Patient tolerated treatment well (weakness; difficulty with function of non surgical leg) Patient left: in chair;with call bell/phone within reach;with family/visitor present (refused alarm)     Time: [id number] PT Time Calculation (min) (ACUTE ONLY): 33  min  Charges:  $Gait Training: 8-22 mins $Therapeutic Exercise: 8-22 mins                    G Codes:      Charlaine Dalton, PTA 12/26/2015, 3:07 PM

## 2015-12-26 NOTE — Progress Notes (Signed)
Patient is A&O x4, up with assist x1 and WW to bathroom. No BM this shift, gave MOM x1. Urinating adequate amounts. Gave oral pain meds with good relief. Wound vac dressing to right hip intact. Drain removed this shift. PPP. CMTS WNL.

## 2015-12-27 LAB — BASIC METABOLIC PANEL
ANION GAP: 3 — AB (ref 5–15)
BUN: 9 mg/dL (ref 6–20)
CHLORIDE: 104 mmol/L (ref 101–111)
CO2: 33 mmol/L — ABNORMAL HIGH (ref 22–32)
Calcium: 8.2 mg/dL — ABNORMAL LOW (ref 8.9–10.3)
Creatinine, Ser: 0.61 mg/dL (ref 0.44–1.00)
GFR calc Af Amer: >60 mL/min (ref >60)
GLUCOSE: 86 mg/dL (ref 65–99)
POTASSIUM: 4 mmol/L (ref 3.5–5.1)
Sodium: 140 mmol/L (ref 135–145)

## 2015-12-27 LAB — CBC
HEMATOCRIT: 32.9 % — AB (ref 35.0–47.0)
HEMOGLOBIN: 11.1 g/dL — AB (ref 12.0–16.0)
MCH: 29.2 pg (ref 26.0–34.0)
MCHC: 33.6 g/dL (ref 32.0–36.0)
MCV: 86.9 fL (ref 80.0–100.0)
PLATELETS: 272 10*3/uL (ref 150–440)
RBC: 3.79 MIL/uL — AB (ref 3.80–5.20)
RDW: 14.3 % (ref 11.5–14.5)
WBC: 8.4 10*3/uL (ref 3.6–11.0)

## 2015-12-27 MED ORDER — MECLIZINE HCL 25 MG PO TABS: 25.0000 mg | ORAL_TABLET | Freq: Three times a day (TID) | ORAL | 0 refills | Status: DC | PRN

## 2015-12-27 MED ORDER — ENOXAPARIN SODIUM 40 MG/0.4ML ~~LOC~~ SOLN: 40.0000 mg | SUBCUTANEOUS | 0 refills | Status: DC

## 2015-12-27 MED ORDER — HYDROCODONE-ACETAMINOPHEN 7.5-325 MG PO TABS: 1.0000 | ORAL_TABLET | ORAL | 0 refills | Status: DC | PRN

## 2015-12-27 NOTE — Discharge Instructions (Signed)

## 2015-12-27 NOTE — Progress Notes (Signed)
  Subjective: 3 Days Post-Op Procedure(s) (LRB): TOTAL HIP ARTHROPLASTY ANTERIOR APPROACH (Right) Patient reports pain as 0 on 0-10 scale with lying in bed. Patient is well, and has had no acute complaints or problems.  Denies any CP, SOB, ABD pain. We will continue therapy today.  Plan is to go Home after hospital stay.  Objective: Vital signs in last 24 hours: Temp:  [97.9 F (36.6 C)-98.5 F (36.9 C)] 98.3 F (36.8 C) (08/25 0351) Pulse Rate:  [68-80] 76 (08/25 0351) Resp:  [16-17] 17 (08/25 0351) BP: (106-112)/(52-67) 106/52 (08/25 0351) SpO2:  [91 %-98 %] 91 % (08/25 0351)  Intake/Output from previous day: 08/24 0701 - 08/25 0700 In: 360 [P.O.:360] Out: -  Intake/Output this shift: No intake/output data recorded.   Recent Labs  12/24/15 1833 12/25/15 0506 12/26/15 0452 12/27/15 0459  HGB 11.9* 11.2* 10.8* 11.1*    Recent Labs  12/26/15 0452 12/27/15 0459  WBC 8.4 8.4  RBC 3.73* 3.79*  HCT 32.2* 32.9*  PLT 240 272    Recent Labs  12/26/15 0452 12/27/15 0459  NA 142 140  K 3.4* 4.0  CL 109 104  CO2 29 33*  BUN 7 9  CREATININE 0.57 0.61  GLUCOSE 132* 86  CALCIUM 8.0* 8.2*   No results for input(s): LABPT, INR in the last 72 hours.  EXAM General - Patient is Alert, Appropriate and Oriented Extremity - Neurovascular intact Sensation intact distally Intact pulses distally Dorsiflexion/Plantar flexion intact No cellulitis present Compartment soft Dressing - dressing C/D/I and Hemovac removed. Wound VAC intact Motor Function - intact, moving foot and toes well on exam.   Past Medical History:  Diagnosis Date  . Arthritis   . Complication of anesthesia    terrible vertigo  . Hypertension   . PONV (postoperative nausea and vomiting)    related to vertigo  . Shortness of breath    WITH EXERTION  . Vertigo    hx post op    Assessment/Plan:   3 Days Post-Op Procedure(s) (LRB): TOTAL HIP ARTHROPLASTY ANTERIOR APPROACH (Right) Active  Problems:   Primary localized osteoarthritis of right hip   hypokalemia Estimated body mass index is 33 kg/m as calculated from the following:   Height as of this encounter: 5\' 10"  (1.778 m).   Weight as of this encounter: 104.3 kg (230 lb). Advance diet Up with therapy  Discharge home with HHPT pending BM and progress with PT Remove wound vac and apply honeycomb dressing 12/31/15. Follow up with [facility] in 2 weeks for staple removal.  DVT Prophylaxis - Lovenox, Foot Pumps and TED hose Weight-Bearing as tolerated to right leg   T. Rachelle Hora, PA-C [facility] 12/27/2015, 7:42 AM

## 2015-12-27 NOTE — Care Management (Signed)
Notified Corliss Blacker with [facility] regarding discharge home today.  Order present for OT and PT

## 2015-12-27 NOTE — Progress Notes (Signed)
Physical Therapy Treatment Patient Details Name: [name] [name] MRN: [id number] DOB: 23-Nov-1950 Today's Date: 12/27/2015    History of Present Illness Pt is a 65 yr old female who underwent a right THA 8/22 secondary to hip OA. PMH includes: HTN, anemia, arthritis, post anesthesia vertigo, and h/o C4-5/5-6 fusion, L TKA with several revisions/manipulation under anesthesia, and R carpal tunnel release.    PT Comments    Pt has met her goals. She is independent with transfers and requires CGA with gait training. This afternoon she ambulated 33' with cuing for posture and safe use of RW. She has a leg length descrepency w/ L LE shorter causing trunk to lean right. Pt reports she has a lift in her shoes at home. Pt received cuing for forward flexed posture and getting too far forward in her walker. Therepeutic activity including use of bathroom with transfer on and off of commode and self hygiene with cuing for safe technique X 10 minutes. Pt is appropriate for continued PT at this time to address deficits in strength, balance, coordination, ROM, pain, endurance, gait, activity tolerance, safety awareness, and safe use of DME.    Follow Up Recommendations  Home health PT     Equipment Recommendations  None recommended by PT    Recommendations for Other Services       Precautions / Restrictions Precautions Precautions: Fall;Anterior Hip Restrictions Weight Bearing Restrictions: Yes RLE Weight Bearing: Weight bearing as tolerated    Mobility  Bed Mobility Overal bed mobility: Needs Assistance Bed Mobility: Supine to Sit     Supine to sit: Min assist     General bed mobility comments: up in chair when PT arrived  Transfers Overall transfer level: Needs assistance Equipment used: Rolling walker (2 wheeled) Transfers: Sit to/from Stand Sit to Stand: Min guard         General transfer comment: Pt stands independently with increased time and effort with safe  technique  Ambulation/Gait Ambulation/Gait assistance: Min guard Ambulation Distance (Feet): 40 Feet Assistive device: Rolling walker (2 wheeled) Gait Pattern/deviations: Antalgic;Decreased stride length Gait velocity: decreased Gait velocity interpretation: <1.8 ft/sec, indicative of risk for recurrent falls General Gait Details: Pt ambulated with safe technique. She has a leg length descrepency w/ L LE shorter causing trunk to lean right. Pt reports she has a lift in her shoes at home. Pt received cuing for forward flexed posture and getting too far forward in her walker   Stairs            Wheelchair Mobility    Modified Rankin (Stroke Patients Only)       Balance Overall balance assessment: Needs assistance Sitting-balance support: Feet supported Sitting balance-Leahy Scale: Good Sitting balance - Comments: Pt sits independently without LOB, able to tolerate light perturbation   Standing balance support: Bilateral upper extremity supported Standing balance-Leahy Scale: Fair Standing balance comment: Pt has forward flexed posture, but she is stable and able to tolerate light perturbation                    Cognition Arousal/Alertness: Awake/alert Behavior During Therapy: WFL for tasks assessed/performed;Impulsive Overall Cognitive Status: Within Functional Limits for tasks assessed                      Exercises Other Exercises Other Exercises: Therepeutic activity including use of bathroom with transfer on and off of commode and self hygiene with cuing for safe technique X 10 minutes    General  Comments        Pertinent Vitals/Pain Pain Assessment: Faces Faces Pain Scale: Hurts little more Pain Descriptors / Indicators: Operative site guarding Pain Intervention(s): Limited activity within patient's tolerance;Monitored during session;Utilized relaxation techniques;Repositioned    Home Living                      Prior Function             PT Goals (current goals can now be found in the care plan section) Acute Rehab PT Goals Patient Stated Goal: to get home today PT Goal Formulation: With patient Time For Goal Achievement: 01/08/16 Potential to Achieve Goals: Good Progress towards PT goals: Goals met and updated - see care plan    Frequency  BID    PT Plan Current plan remains appropriate    Co-evaluation             End of Session Equipment Utilized During Treatment: Gait belt Activity Tolerance: Patient tolerated treatment well Patient left: in chair;with call bell/phone within reach;with chair alarm set     Time: 9390-3009 PT Time Calculation (min) (ACUTE ONLY): 26 min  Charges:                       G Codes:      [name] [name] 19-Jan-2016, 4:48 PM  [name] [name], SPT [phone]

## 2015-12-27 NOTE — Progress Notes (Signed)
[name] [name] to be D/C'd Home per MD order.  Discussed with the patient and all questions fully answered.  VSS, Skin clean, dry and intact without evidence of skin break down, no evidence of skin tears noted. IV catheter discontinued intact. Site without signs and symptoms of complications. Dressing and pressure applied.  An After Visit Summary was printed and given to the patient. Patient received prescription.  D/c education completed with patient/family including follow up instructions, medication list, d/c activities limitations if indicated, with other d/c instructions as indicated by MD - patient able to verbalize understanding, all questions fully answered.   Patient instructed to return to ED, call 911, or call MD for any changes in condition.   Patient escorted via [place], and D/C home via private auto.  Deri Fuelling 12/27/2015 3:37 PM

## 2015-12-27 NOTE — Care Management Important Message (Signed)
Important Message  Patient Details  Name: [name] [name] MRN: [id number] Date of Birth: [birth year]   Medicare Important Message Given:  Yes    Katrina Stack, RN 12/27/2015, 10:15 AM

## 2015-12-27 NOTE — Progress Notes (Signed)
Physical Therapy Treatment Patient Details Name: [name] [name] MRN: [id number] DOB: [birth year] Today's Date: 12/27/2015    History of Present Illness Pt is a 65 yr old female who underwent a right THA 8/22 secondary to hip OA. PMH includes: HTN, anemia, arthritis, post anesthesia vertigo, and h/o C4-5/5-6 fusion, L TKA with several revisions/manipulation under anesthesia, and R carpal tunnel release.    PT Comments    Pt is up to walk with slow pace and needs coaching to distribute pressure equally on hnds due to some soreness of palms despite sheepskin on walker.  Has listed x 3 to R side and reminded her this will be unsafe at home and any person assisting her needs to stand on her R side.  Follow Up Recommendations  Home health PT     Equipment Recommendations  None recommended by PT    Recommendations for Other Services Rehab consult     Precautions / Restrictions Precautions Precautions: Fall;Anterior Hip Restrictions Weight Bearing Restrictions: Yes RLE Weight Bearing: Weight bearing as tolerated    Mobility  Bed Mobility               General bed mobility comments: up in chair when PT arrived  Transfers Overall transfer level: Needs assistance Equipment used: Rolling walker (2 wheeled) Transfers: Sit to/from [facility] Sit to Stand: Min guard Stand pivot transfers: Min guard       General transfer comment: controlled to sit or stand  Ambulation/Gait Ambulation/Gait assistance: Supervision;Min guard Ambulation Distance (Feet): 140 Feet Assistive device: Rolling walker (2 wheeled) Gait Pattern/deviations: Step-to pattern;Step-through pattern;Wide base of support;Antalgic;Decreased stride length Gait velocity: decreased Gait velocity interpretation: Below normal speed for age/gender General Gait Details: lists to R several times due to changing grip on walker and cautioned her about this to balance the pressure   Stairs            Wheelchair Mobility    Modified Rankin (Stroke Patients Only)       Balance     Sitting balance-Leahy Scale: Good   Postural control: Posterior lean Standing balance support: Bilateral upper extremity supported Standing balance-Leahy Scale: Fair                      Cognition Arousal/Alertness: Awake/alert Behavior During Therapy: WFL for tasks assessed/performed Overall Cognitive Status: Within Functional Limits for tasks assessed                      Exercises Total Joint Exercises Ankle Circles/Pumps: AROM;Both;5 reps Quad Sets: AROM;Both;10 reps Heel Slides: AROM;Both;10 reps Long Arc Quad: AROM;Both;10 reps    General Comments        Pertinent Vitals/Pain Pain Assessment: 0-10 Pain Score: 4  Pain Location: R hip Pain Descriptors / Indicators: Operative site guarding Pain Intervention(s): Monitored during session;Premedicated before session;Repositioned    Home Living                      Prior Function            PT Goals (current goals can now be found in the care plan section) Acute Rehab PT Goals Patient Stated Goal: to get home today Progress towards PT goals: Progressing toward goals    Frequency  7X/week    PT Plan Current plan remains appropriate    Co-evaluation             End of Session Equipment Utilized During Treatment: Gait  belt Activity Tolerance: Treatment limited secondary to medical complications (Comment) (good but having trouble with wgt shift onto L knee ) Patient left: in chair;with call bell/phone within reach     Time: 0918-0946 PT Time Calculation (min) (ACUTE ONLY): 28 min  Charges:  $Gait Training: 8-22 mins $Therapeutic Exercise: 8-22 mins                    G Codes:      Ramond Dial 01-22-16, 10:38 AM    Mee Hives, PT MS Acute Rehab Dept. Number: [place]

## 2015-12-27 NOTE — Discharge Summary (Signed)
Physician Discharge Summary  Patient ID: [name] [name] MRN: [id number] DOB/AGE: [birth year] 65 y.o.  Admit date: 12/24/2015 Discharge date: 12/27/2015  Admission Diagnoses:  RIGHT HIP PAIN, PRIMARY OSTEOARTHRITIS OF RIGHT HIP   Discharge Diagnoses: Patient Active Problem List   Diagnosis Date Noted  . Primary localized osteoarthritis of right hip 12/24/2015  . Arthrofibrosis of knee joint 03/05/2014  . Hypokalemia 05/30/2013  . OA (osteoarthritis) of knee 05/29/2013  . Postoperative stiffness of total knee replacement ([place]) 02/15/2013  . Postoperative anemia due to acute blood loss 12/09/2012  . 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 Arthritis   . Complication of anesthesia    terrible vertigo  . Hypertension   . PONV (postoperative nausea and vomiting)    related to vertigo  . Shortness of breath    WITH EXERTION  . Vertigo    hx post op     Transfusion: none   Consultants (if any):   Discharged Condition: Improved  Hospital Course: QUANAH SLINGER is an 65 y.o. female who was admitted 12/24/2015 with a diagnosis of right hip osteoarthritis and went to the operating room on 12/24/2015 and underwent the above named procedures.    Surgeries: Procedure(s): TOTAL HIP ARTHROPLASTY ANTERIOR APPROACH on 12/24/2015 Patient tolerated the surgery well. Taken to PACU where she was stabilized and then transferred to the orthopedic floor.  Started on Lovenox 40 q 24 hrs. Foot pumps applied bilaterally at 80 mm. Heels elevated on bed with rolled towels. No evidence of DVT. Negative [place]oman. Physical therapy started on day #1 for gait training and transfer. OT started day #1 for ADL and assisted devices.  Patient's foley was d/c on day #1. Patient's IV was d/c on day #2.  On post op day #3 patient was stable and ready for discharge to home with HHPT.  Implants: Medacta AMIS  2 stem with a 52 mm Mpact cup DM with liner and  S 28 head  She was given perioperative antibiotics:  Anti-infectives    Start     Dose/Rate Route Frequency Ordered Stop   12/24/15 1930  ceFAZolin (ANCEF) IVPB 2g/100 mL premix     2 g 200 mL/hr over 30 Minutes Intravenous Every 8 hours 12/24/15 1732 12/25/15 1338   12/24/15 1630  ceFAZolin (ANCEF) IVPB 2g/100 mL premix  Status:  Discontinued     2 g 200 mL/hr over 30 Minutes Intravenous Every 6 hours 12/24/15 1629 12/24/15 1732   12/24/15 0947  ceFAZolin (ANCEF) 2-4 GM/100ML-% IVPB    Comments:  Veda Canning: cabinet override      12/24/15 0947 12/24/15 2159   12/24/15 0200  ceFAZolin (ANCEF) IVPB 2g/100 mL premix     2 g 200 mL/hr over 30 Minutes Intravenous  Once 12/24/15 0152 12/24/15 1321    .  She was given sequential compression devices, early ambulation, and lovenox for DVT prophylaxis.  She benefited maximally from the hospital stay and there were no complications.    Recent vital signs:  Vitals:   12/26/15 2222 12/27/15 0351  BP: 110/67 (!) 106/52  Pulse: 80 76  Resp: 16 17  Temp: 98.5 F (36.9 C) 98.3 F (36.8 C)    Recent laboratory studies:  Lab Results  Component Value Date   HGB 11.1 (L) 12/27/2015   HGB 10.8 (L) 12/26/2015   HGB 11.2 (L) 12/25/2015   Lab Results  Component Value Date   WBC 8.4 12/27/2015  PLT 272 12/27/2015   Lab Results  Component Value Date   INR 0.97 12/18/2015   Lab Results  Component Value Date   NA 140 12/27/2015   K 4.0 12/27/2015   CL 104 12/27/2015   CO2 33 (H) 12/27/2015   BUN 9 12/27/2015   CREATININE 0.61 12/27/2015   GLUCOSE 86 12/27/2015    Discharge Medications:     Medication List    STOP taking these medications   ibuprofen 200 MG tablet Commonly known as:  ADVIL,MOTRIN   naproxen sodium 220 MG tablet Commonly known as:  ANAPROX     TAKE these medications   acetaminophen 500 MG tablet Commonly known as:  TYLENOL Take 500 mg by mouth daily as needed.   enoxaparin 40 MG/0.4ML  injection Commonly known as:  LOVENOX Inject 0.4 mLs (40 mg total) into the skin daily.   fexofenadine 180 MG tablet Commonly known as:  ALLEGRA Take 180 mg by mouth at bedtime as needed for allergies or rhinitis.   hydrochlorothiazide 25 MG tablet Commonly known as:  HYDRODIURIL Take 25 mg by mouth every morning.   HYDROcodone-acetaminophen 7.5-325 MG tablet Commonly known as:  NORCO Take 1-2 tablets by mouth every 4 (four) hours as needed (breakthrough pain).   meclizine 25 MG tablet Commonly known as:  ANTIVERT Take 1 tablet (25 mg total) by mouth 3 (three) times daily as needed for dizziness. What changed:  Another medication with the same name was added. Make sure you understand how and when to take each.   meclizine 25 MG tablet Commonly known as:  ANTIVERT Take 1 tablet (25 mg total) by mouth 3 (three) times daily as needed for dizziness. What changed:  You were already taking a medication with the same name, and this prescription was added. Make sure you understand how and when to take each.   ondansetron 4 MG disintegrating tablet Commonly known as:  ZOFRAN ODT Take 1 tablet (4 mg total) by mouth every 6 (six) hours as needed for nausea.   triamcinolone cream 0.1 % Commonly known as:  KENALOG Apply 1 application topically daily as needed (for itching).       Diagnostic Studies: Dg Hip Operative Unilat W Or W/o Pelvis Right  Result Date: 12/24/2015 CLINICAL DATA:  Right total hip arthroplasty. EXAM: OPERATIVE RIGHT HIP (WITH PELVIS IF PERFORMED)  VIEWS TECHNIQUE: Fluoroscopic spot image(s) were submitted for interpretation post-operatively. COMPARISON:  None. FINDINGS: Single fluoroscopic image from right total hip arthroplasty demonstrates anatomic alignment of the orthopedic hardware with long stem partially visualized femoral component. No evidence of fracture. Fluoroscopy time was recorded as 30 seconds. IMPRESSION: Status post total right hip arthroplasty, without  evidence of immediate complications. Electronically Signed   By: Fidela Salisbury M.D.   On: 12/24/2015 15:08   Dg Hip Unilat W Or W/o Pelvis 2-3 Views Right  Result Date: 12/24/2015 CLINICAL DATA:  Status post right total hip arthroplasty. EXAM: DG HIP (WITH OR WITHOUT PELVIS) 2-3V RIGHT COMPARISON:  Same day. FINDINGS: The femoral and acetabular components appear to be well situated. Surgical drain is noted in the surrounding soft tissues. No fracture or dislocation is noted. IMPRESSION: Status post right total hip arthroplasty. Electronically Signed   By: Marijo Conception, M.D.   On: 12/24/2015 15:59    Disposition: 01-Home or [place], MD Follow up in 2 week(s).   Specialty:  Orthopedic Surgery Why:  Remove wound vac and apply  honeycomb dressing 12/31/15. Follow up with [facility] ortho in 2 weeks for staple removal. Contact information: [place] [address] [phone]        Maeola Sarah, MD .   Specialty:  Sports Medicine Contact information: [address] [place] [id number] [place] [address] [phone]            Signed: Feliberto Gottron 12/27/2015, 7:53 AM

## 2016-01-05 ENCOUNTER — Encounter: Payer: Self-pay | Admitting: Emergency Medicine

## 2016-01-05 ENCOUNTER — Emergency Department
Admission: EM | Admit: 2016-01-05 | Discharge: 2016-01-05 | Disposition: A | Discharge status: Home / Self Care | Payer: Medicare HMO | Attending: Emergency Medicine | Admitting: Emergency Medicine

## 2016-01-05 DIAGNOSIS — L299 Pruritus, unspecified: Secondary | ICD-10-CM | POA: Diagnosis not present

## 2016-01-05 DIAGNOSIS — R21 Rash and other nonspecific skin eruption: Secondary | ICD-10-CM | POA: Diagnosis present

## 2016-01-05 DIAGNOSIS — I10 Essential (primary) hypertension: Secondary | ICD-10-CM | POA: Diagnosis not present

## 2016-01-05 DIAGNOSIS — Z79899 Other long term (current) drug therapy: Secondary | ICD-10-CM | POA: Diagnosis not present

## 2016-01-05 MED ORDER — RANITIDINE HCL 150 MG PO TABS: 150.0000 mg | ORAL_TABLET | Freq: Two times a day (BID) | ORAL | 1 refills | Status: DC

## 2016-01-05 MED ORDER — TRIAMCINOLONE ACETONIDE 0.5 % EX OINT: 1.0000 "application " | TOPICAL_OINTMENT | Freq: Two times a day (BID) | CUTANEOUS | 0 refills | Status: DC

## 2016-01-05 NOTE — ED Provider Notes (Signed)
[facility] Emergency Department Provider Note  ____________________________________________   First MD Initiated Contact with Patient 01/05/16 [phone]     (approximate)  I have reviewed the triage vital signs and the nursing notes.   HISTORY  Chief Complaint Rash and Pruritis    HPI [name] [name] is a 65 y.o. female is complaining of rash and itching.Patient states that the left knee has been itching for most of the night as well as her right arm and left. She also complains of some itching on her right leg. Patient states that she had a hip replacement on 8/22 with out any complications. Currently she is taking  pain medication that she is continued since her surgery. Patient denies any difficulty breathing, swallowing or talking. Last evening patient took 2 Benadryl with minimal relief and use some triamcinolone cream that she had at home. She denies any other itching on the remainder of her body.   Past Medical History:  Diagnosis Date  . Arthritis   . Complication of anesthesia    terrible vertigo  . Hypertension   . PONV (postoperative nausea and vomiting)    related to vertigo  . Shortness of breath    WITH EXERTION  . Vertigo    hx post op    Patient Active Problem List   Diagnosis Date Noted  . Primary localized osteoarthritis of right hip 12/24/2015  . Arthrofibrosis of knee joint 03/05/2014  . Hypokalemia 05/30/2013  . OA (osteoarthritis) of knee 05/29/2013  . Postoperative stiffness of total knee replacement ([place]) 02/15/2013  . Postoperative anemia due to acute blood loss 12/09/2012  . 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 BACK SURGERY  2010   cervical fusion  . BREAST SURGERY Left 2005   biopsy  . CARPAL TUNNEL RELEASE Right   . FOOT SURGERY Left    heel---STATES METAL IN THE HEEL - FOOT TURNS OUTWARD  . HEEL SPUR SURGERY    . HYSTEROSCOPY W/D&C N/A  07/26/2015   Procedure: DILATATION AND CURETTAGE /HYSTEROSCOPY with cervical biopsy;  Surgeon: Benjaman Kindler, MD;  Location: [facility] ORS;  Service: Gynecology;  Laterality: N/A;  . JOINT REPLACEMENT Left 2013   knee  . KNEE ARTHROTOMY Left 05/29/2013   Procedure: LEFT KNEE ARTHROTOMY WITH SCAR EXCISION;  Surgeon: Gearlean Alf, MD;  Location: [facility] ORS;  Service: Orthopedics;  Laterality: Left;  . KNEE ARTHROTOMY Left 03/05/2014   Procedure: LEFT KNEE ARTHROTOMY/SCAR EXCISION/POLYETHYLENE REVISION;  Surgeon: Gearlean Alf, MD;  Location: [facility] ORS;  Service: Orthopedics;  Laterality: Left;  . KNEE CLOSED REDUCTION Left 02/15/2013   Procedure: CLOSED MANIPULATION LEFT KNEE;  Surgeon: Gearlean Alf, MD;  Location: [facility] ORS;  Service: Orthopedics;  Laterality: Left;  . TOTAL HIP ARTHROPLASTY Right 12/24/2015   Procedure: TOTAL HIP ARTHROPLASTY ANTERIOR APPROACH;  Surgeon: Hessie Knows, MD;  Location: [facility] ORS;  Service: Orthopedics;  Laterality: Right;  . TOTAL KNEE REVISION Left 12/07/2012   Procedure:  TOTAL KNEE ARTHROPLASTY REVISION;  Surgeon: Gearlean Alf, MD;  Location: [facility] OR[facility];  Service: Orthopedics;  Laterality: Left;  . TUBAL LIGATION      Prior to Admission medications   Medication Sig Start Date End Date Taking? Authorizing Provider  acetaminophen (TYLENOL) 500 MG tablet Take 500 mg by mouth daily as needed.     Historical Provider, MD  enoxaparin (LOVENOX) 40 MG/0.4ML injection Inject 0.4 mLs (40 mg total) into the skin  daily. 12/27/15 01/10/16  Duanne Guess, PA-C  fexofenadine (ALLEGRA) 180 MG tablet Take 180 mg by mouth at bedtime as needed for allergies or rhinitis.    Historical Provider, MD  hydrochlorothiazide (HYDRODIURIL) 25 MG tablet Take 25 mg by mouth every morning.    Historical Provider, MD  HYDROcodone-acetaminophen (NORCO) 7.5-325 MG tablet Take 1-2 tablets by mouth every 4 (four) hours as needed (breakthrough pain). 12/27/15   Duanne Guess, PA-C  meclizine (ANTIVERT) 25 MG  tablet Take 1 tablet (25 mg total) by mouth 3 (three) times daily as needed for dizziness. 04/30/15   Schuyler Amor, MD  meclizine (ANTIVERT) 25 MG tablet Take 1 tablet (25 mg total) by mouth 3 (three) times daily as needed for dizziness. 12/27/15   Duanne Guess, PA-C  ondansetron (ZOFRAN ODT) 4 MG disintegrating tablet Take 1 tablet (4 mg total) by mouth every 6 (six) hours as needed for nausea. 07/26/15   Benjaman Kindler, MD  ranitidine (ZANTAC) 150 MG tablet Take 1 tablet (150 mg total) by mouth 2 (two) times daily. 01/05/16 01/04/17  [name], PA-C  triamcinolone ointment (KENALOG) 0.5 % Apply 1 application topically 2 (two) times daily. 01/05/16   [name], PA-C    Allergies [place] grass allergy skin test and Oxycodone  Family History  Problem Relation Age of Onset  . Heart attack Mother   . Colon cancer Father     Social History Social History  Substance Use Topics  . Smoking status: Never Smoker  . Smokeless tobacco: Never Used  . Alcohol use No    Review of Systems Constitutional: No fever/chills ENT: No Complaints. Cardiovascular: Denies chest pain. Respiratory: Denies shortness of breath. Gastrointestinal: No abdominal pain.  No nausea, no vomiting.  Musculoskeletal: Negative for back pain. Skin: Itching localized areas as noted above on left knee, right arm and right leg. Neurological: Negative for headaches, focal weakness or numbness.  10-point ROS otherwise negative.  ____________________________________________   PHYSICAL EXAM:  VITAL SIGNS: ED Triage Vitals  Enc Vitals Group     BP 01/05/16 0717 116/85     Pulse Rate 01/05/16 0717 84     Resp 01/05/16 0717 18     Temp 01/05/16 0717 98 F (36.7 C)     Temp Source 01/05/16 0717 Oral     SpO2 01/05/16 0717 97 %     Weight 01/05/16 0718 230 lb (104.3 kg)     Height 01/05/16 0718 5\' 10"  (1.778 m)     Head Circumference --      Peak Flow --      Pain Score 01/05/16 0718 6     Pain Loc --       Pain Edu? --      Excl. in [place]? --     Constitutional: Alert and oriented. Well appearing and in no acute distress. Eyes: Conjunctivae are normal. PERRL. EOMI. Head: Atraumatic. Nose: No congestion/rhinnorhea. Neck: No stridor.   Cardiovascular: Normal rate, regular rhythm. Grossly normal heart sounds.  Good peripheral circulation. Respiratory: Normal respiratory effort.  No retractions. Lungs CTAB. Gastrointestinal: Soft and nontender. No distention. Musculoskeletal: Moves upper and lower extremities without any difficulty. Patient does have slow range of motion with her right hip secondary to her hip surgery but is still able to ambulate. There is minimal skin eruption noted. There is no erythema present except for when patient is scratching. There is no involvement of the back or abdomen. There is no localized rashes  noted on the above named areas. Skin on the right and left hand does appear to be very dry. Neurologic:  Normal speech and language. No gross focal neurologic deficits are appreciated.  Skin:  Skin is warm, dry and intact. No rash noted. Psychiatric: Mood and affect are normal. Speech and behavior are normal.  ____________________________________________   LABS (all labs ordered are listed, but only abnormal results are displayed)  Labs Reviewed - No data to display  PROCEDURES  Procedure(s) performed: None  Procedures  Critical Care performed: No  ____________________________________________   INITIAL IMPRESSION / ASSESSMENT AND PLAN / ED COURSE  Pertinent labs & imaging results that were available during my care of the patient were reviewed by me and considered in my medical decision making (see chart for details).    Clinical Course   Patient was told she could continue taking Benadryl but to be aware that this could make her sleepy and increase her risk for falling. She is placed on Zantac 150 mg twice a day. She is also given a prescription for  triamcinolone cream 0.5% to apply to areas. Patient is to follow-up with her primary care doctor if any continued problems with itching.  ____________________________________________   FINAL CLINICAL IMPRESSION(S) / ED DIAGNOSES  Final diagnoses:  Generalized pruritus      NEW MEDICATIONS STARTED DURING THIS VISIT:  Discharge Medication List as of 01/05/2016  7:41 AM    START taking these medications   Details  ranitidine (ZANTAC) 150 MG tablet Take 1 tablet (150 mg total) by mouth 2 (two) times daily., Starting Sun 01/05/2016, Until Mon 01/04/2017, Print    triamcinolone ointment (KENALOG) 0.5 % Apply 1 application topically 2 (two) times daily., Starting Sun 01/05/2016, Print         Note:  This document was prepared using Dragon voice recognition software and may include unintentional dictation errors.    JILLIYN MOESCH, PA-C 01/05/16 1115    Rudene Re, MD 01/05/16 [phone]

## 2016-01-05 NOTE — Discharge Instructions (Signed)
Continue Benadryl as needed for itching. Be aware that this medication can cause drowsiness increase your risk for falling.  Begin taking Zantac 1 twice a day which will help with itching but will not cause drowsiness. Kenalog cream as directed for itching. Contact sure primary care doctor on Tuesday if any continued problems.

## 2016-01-05 NOTE — ED Triage Notes (Signed)
Pt states she had hip replacement surgery on 8/22.  Noticed itching and redness to arms and legs. Tried using triamcinolone cream and bendaryl. Pt has dry skin and redness.  Minimal rash noted at this time.  Pt states it is worse to left arm and behind right leg.  No complications post-op per pt.  Pt is taking pain medication.

## 2016-01-18 IMAGING — CR DG CHEST 2V
2 series · 2 of 2 positions shown · non-contrast
Comparison: 12/01/2012.

CLINICAL DATA: Preop knee surgery, hypertension. Shortness of
breath with exertion.

EXAM:
CHEST  2 VIEW

[w chest pa]
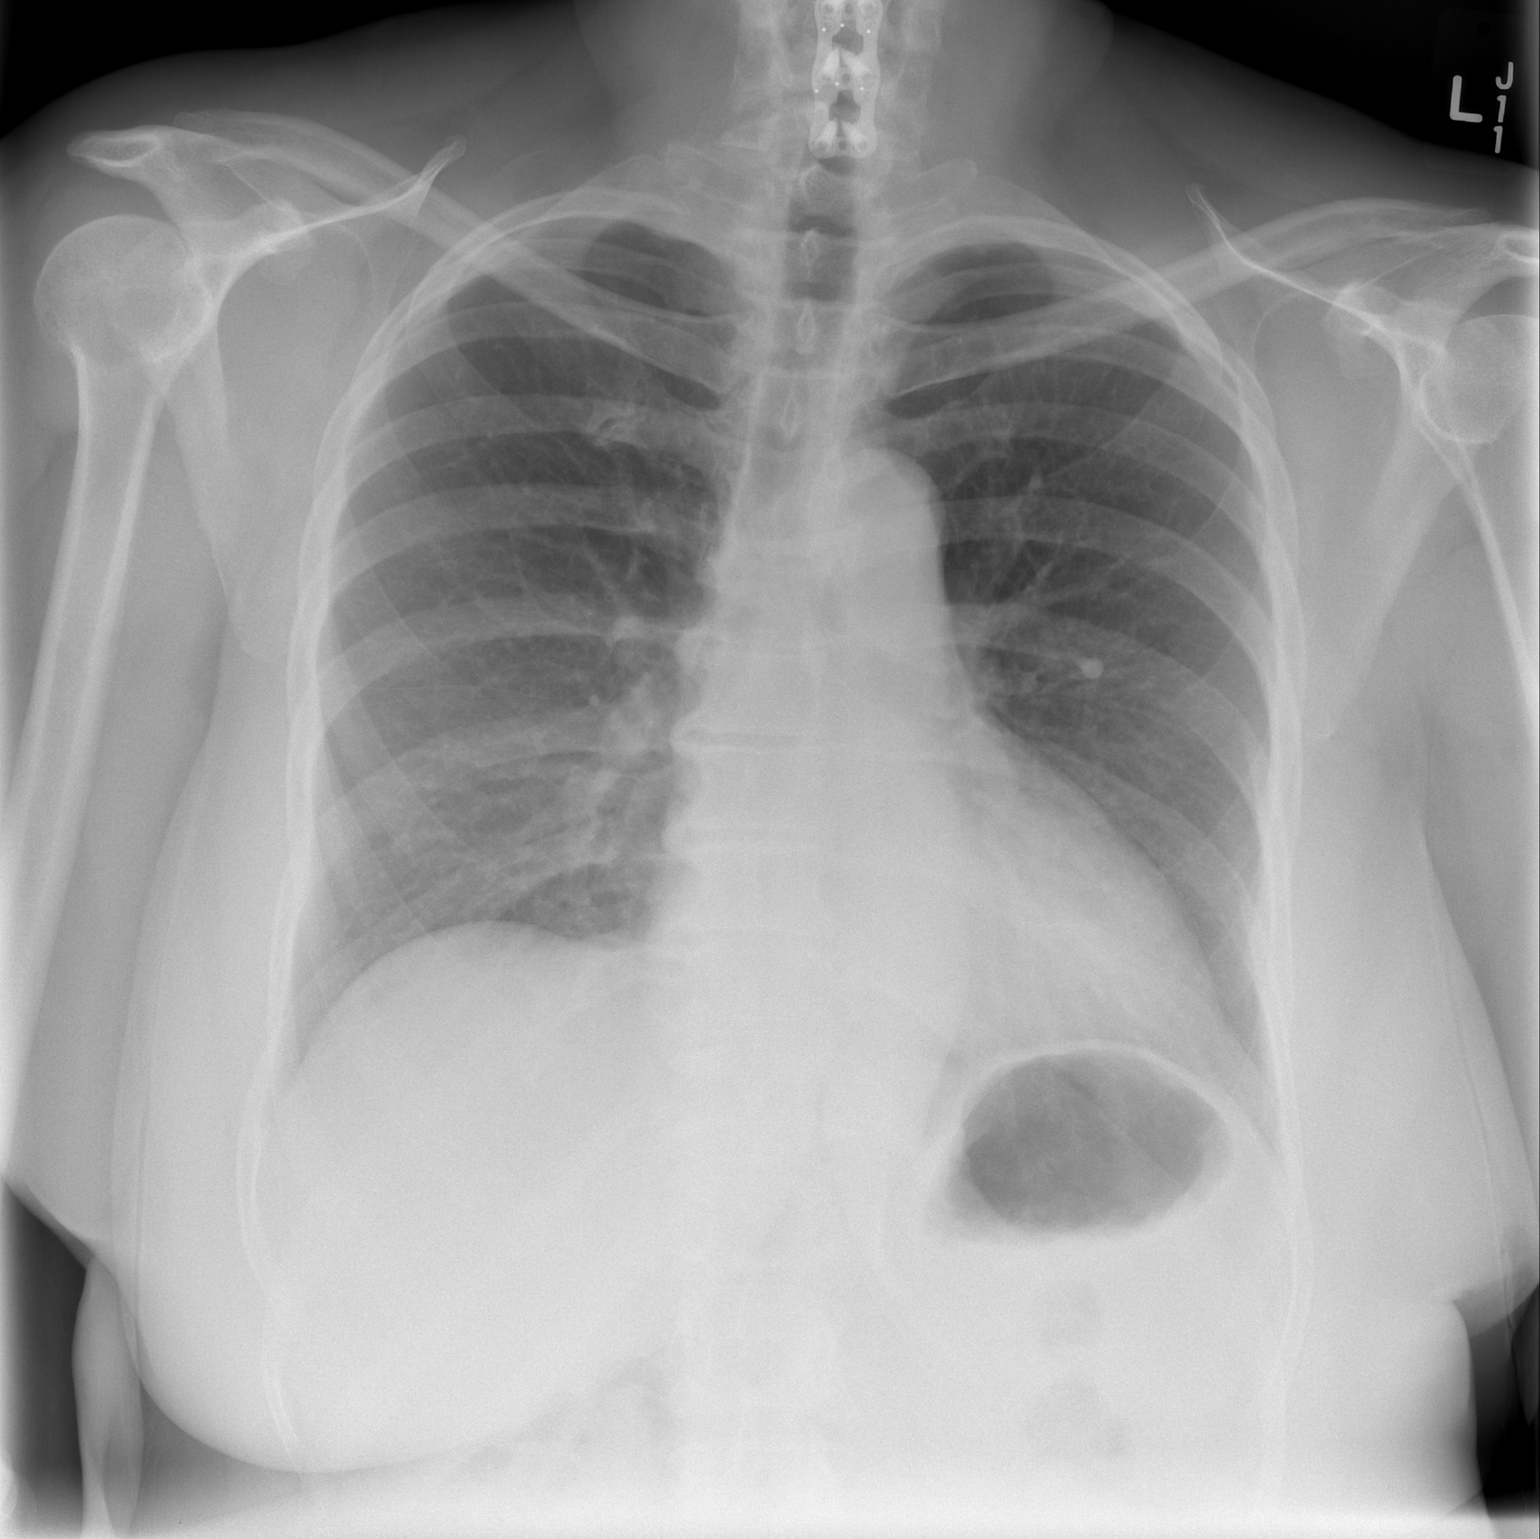

[w chest lat]
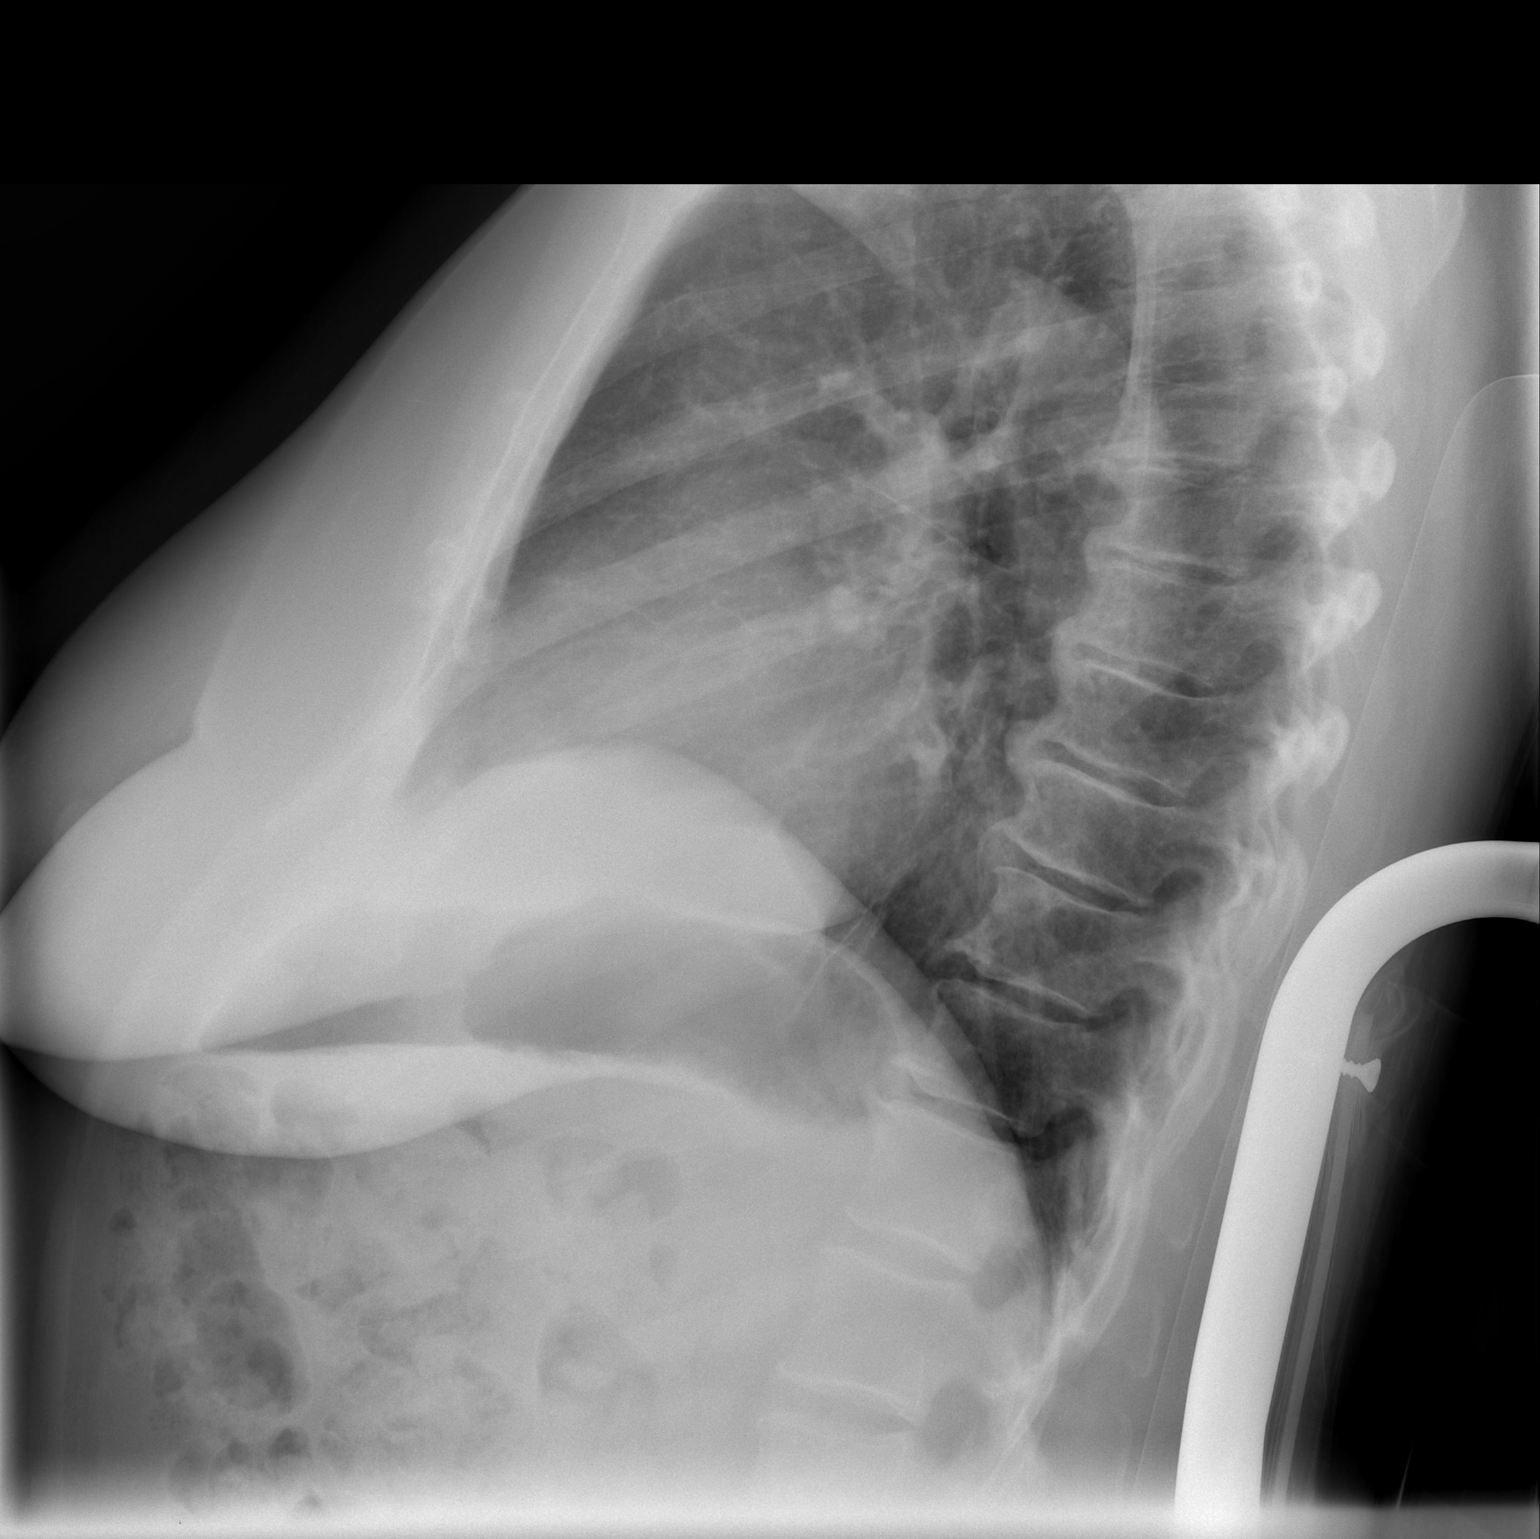

[2 of 2 positions shown; findings below may reference images not displayed]

FINDINGS: Trachea is midline. Heart size stable. Lungs are clear. No pleural
fluid. Degenerative changes are seen in the spine.
IMPRESSION: No acute findings.

## 2017-07-27 ENCOUNTER — Other Ambulatory Visit: Payer: Self-pay | Admitting: Family Medicine

## 2017-07-27 DIAGNOSIS — R928 Other abnormal and inconclusive findings on diagnostic imaging of breast: Secondary | ICD-10-CM

## 2017-08-19 ENCOUNTER — Ambulatory Visit
Admission: RE | Admit: 2017-08-19 | Discharge: 2017-08-19 | Disposition: A | Discharge status: Home / Self Care | Payer: Medicare HMO | Source: Ambulatory Visit | Attending: Family Medicine | Admitting: Family Medicine

## 2017-08-19 DIAGNOSIS — R928 Other abnormal and inconclusive findings on diagnostic imaging of breast: Secondary | ICD-10-CM

## 2018-02-03 ENCOUNTER — Other Ambulatory Visit: Payer: Self-pay

## 2018-02-03 ENCOUNTER — Encounter
Admission: RE | Admit: 2018-02-03 | Discharge: 2018-02-03 | Disposition: A | Discharge status: Home / Self Care | Payer: Medicare HMO | Source: Ambulatory Visit | Attending: Orthopedic Surgery | Admitting: Orthopedic Surgery

## 2018-02-03 DIAGNOSIS — R001 Bradycardia, unspecified: Secondary | ICD-10-CM | POA: Insufficient documentation

## 2018-02-03 DIAGNOSIS — Z01818 Encounter for other preprocedural examination: Secondary | ICD-10-CM | POA: Diagnosis not present

## 2018-02-03 DIAGNOSIS — I10 Essential (primary) hypertension: Secondary | ICD-10-CM | POA: Insufficient documentation

## 2018-02-03 DIAGNOSIS — M1612 Unilateral primary osteoarthritis, left hip: Secondary | ICD-10-CM | POA: Diagnosis not present

## 2018-02-03 LAB — BASIC METABOLIC PANEL
ANION GAP: 7 (ref 5–15)
BUN: 9 mg/dL (ref 8–23)
CO2: 31 mmol/L (ref 22–32)
Calcium: 9.1 mg/dL (ref 8.9–10.3)
Chloride: 104 mmol/L (ref 98–111)
Creatinine, Ser: 0.73 mg/dL (ref 0.44–1.00)
GFR calc Af Amer: >60 mL/min (ref >60)
GFR calc non Af Amer: >60 mL/min (ref >60)
Glucose, Bld: 83 mg/dL (ref 70–99)
POTASSIUM: 3.8 mmol/L (ref 3.5–5.1)
SODIUM: 142 mmol/L (ref 135–145)

## 2018-02-03 LAB — PROTIME-INR
INR: 1.03
PROTHROMBIN TIME: 13.4 s (ref 11.4–15.2)

## 2018-02-03 LAB — URINALYSIS, ROUTINE W REFLEX MICROSCOPIC
Bilirubin Urine: NEGATIVE
GLUCOSE, UA: NEGATIVE mg/dL
Hgb urine dipstick: NEGATIVE
Ketones, ur: NEGATIVE mg/dL
Leukocytes, UA: NEGATIVE
Nitrite: NEGATIVE
Protein, ur: NEGATIVE mg/dL
SPECIFIC GRAVITY, URINE: 1.014 (ref 1.005–1.030)
pH: 6 (ref 5.0–8.0)

## 2018-02-03 LAB — SURGICAL PCR SCREEN
MRSA, PCR: NEGATIVE
Staphylococcus aureus: NEGATIVE

## 2018-02-03 LAB — CBC
HCT: 41.2 % (ref 35.0–47.0)
Hemoglobin: 14 g/dL (ref 12.0–16.0)
MCH: 30.1 pg (ref 26.0–34.0)
MCHC: 33.8 g/dL (ref 32.0–36.0)
MCV: 88.9 fL (ref 80.0–100.0)
Platelets: 315 10*3/uL (ref 150–440)
RBC: 4.64 MIL/uL (ref 3.80–5.20)
RDW: 14.6 % — ABNORMAL HIGH (ref 11.5–14.5)
WBC: 7.7 10*3/uL (ref 3.6–11.0)

## 2018-02-03 LAB — TYPE AND SCREEN
ABO/RH(D): O POS
Antibody Screen: NEGATIVE

## 2018-02-03 LAB — APTT: APTT: 31 s (ref 24–36)

## 2018-02-03 LAB — SEDIMENTATION RATE: SED RATE: 12 mm/h (ref 0–30)

## 2018-02-03 NOTE — Patient Instructions (Signed)
Your procedure is scheduled on: Thursday 02/10/18  Report to [place]. To find out your arrival time please call [phone] between 1PM - 3PM on Wednesday 02/09/09  Remember: Instructions that are not followed completely may result in serious medical risk, up to and including death, or upon the discretion of your surgeon and anesthesiologist your surgery may need to be rescheduled.     _X__ 1. Do not eat food after midnight the night before your procedure.                 No gum chewing or hard candies. You may drink clear liquids up to 2 hours                 before you are scheduled to arrive for your surgery- DO NOT drink clear                 liquids within 2 hours of the start of your surgery.                 Clear Liquids include:  water, apple juice without pulp, clear carbohydrate                 drink such as Clearfast or Gatorade, Black Coffee or Tea (Do not add                 anything to coffee or tea).  __X__2.  On the morning of surgery brush your teeth with toothpaste and water, you may rinse your mouth with mouthwash if you wish.  Do not swallow any toothpaste or mouthwash.     _X__ 3.  No Alcohol for 24 hours before or after surgery.   _X__ 4.  Do Not Smoke or use e-cigarettes For 24 Hours Prior to Your Surgery.                 Do not use any chewable tobacco products for at least 6 hours prior to                 surgery.  ____  5.  Bring all medications with you on the day of surgery if instructed.   __X__  6.  Notify your doctor if there is any change in your medical condition      (cold, fever, infections).     Do not wear jewelry, make-up, hairpins, clips or nail polish. Do not wear lotions, powders, or perfumes.  Do not shave 48 hours prior to surgery. Men may shave face and neck. Do not bring valuables to the hospital.    [facility] is not responsible for any belongings or valuables.  Contacts,  dentures/partials or body piercings may not be worn into surgery. Bring a case for your contacts, glasses or hearing aids, a denture cup will be supplied. Leave your suitcase in the car. After surgery it may be brought to your room. For patients admitted to the hospital, discharge time is determined by your treatment team.   Patients discharged the day of surgery will not be allowed to drive home.   Please read over the following fact sheets that you were given:   MRSA Information  __X__ Take these medicines the morning of surgery with A SIP OF WATER:     1. brimonidine (ALPHAGAN P) 0.1 % SOLN  2. meclizine (ANTIVERT) 25 MG tablet  3.   4.  5.  6.  __X__ Use CHG Soap as directed   __X__ Stop Blood Thinners Coumadin/Plavix/Xarelto/Pleta/Pradaxa/Eliquis/Effient/Aspirin, etodolac (LODINE) 500 MG tablet TODAY  __X__ Stop Anti-inflammatories 7 days before surgery such as Advil, Ibuprofen, Motrin, BC or Goodies Powder, Naprosyn, Naproxen, Aleve, Aspirin, Meloxicam. May take Tylenol if needed for pain or discomfort.

## 2018-02-05 LAB — URINE CULTURE

## 2018-02-07 NOTE — Pre-Procedure Instructions (Signed)
[facility] FAXED

## 2018-02-09 MED ORDER — CEFAZOLIN SODIUM-DEXTROSE 2-4 GM/100ML-% IV SOLN
2.0000 g | Freq: Once | INTRAVENOUS | Status: AC
  Administered 2018-02-10: 2 g via INTRAVENOUS

## 2018-02-10 ENCOUNTER — Inpatient Hospital Stay: Payer: Medicare HMO

## 2018-02-10 ENCOUNTER — Inpatient Hospital Stay: Payer: Medicare HMO | Admitting: Anesthesiology

## 2018-02-10 ENCOUNTER — Encounter
Admission: RE | Disposition: A | Discharge status: Home Health | Payer: Self-pay | Source: Home / Self Care | Attending: Orthopedic Surgery

## 2018-02-10 ENCOUNTER — Inpatient Hospital Stay
Admission: RE | Admit: 2018-02-10 | Discharge: 2018-02-13 | DRG: 470 | Disposition: A | Discharge status: Home Health | Payer: Medicare HMO | Attending: Orthopedic Surgery | Admitting: Orthopedic Surgery

## 2018-02-10 ENCOUNTER — Other Ambulatory Visit: Payer: Self-pay

## 2018-02-10 DIAGNOSIS — R1031 Right lower quadrant pain: Secondary | ICD-10-CM | POA: Diagnosis not present

## 2018-02-10 DIAGNOSIS — G8918 Other acute postprocedural pain: Secondary | ICD-10-CM

## 2018-02-10 DIAGNOSIS — I10 Essential (primary) hypertension: Secondary | ICD-10-CM | POA: Diagnosis present

## 2018-02-10 DIAGNOSIS — Z79899 Other long term (current) drug therapy: Secondary | ICD-10-CM | POA: Diagnosis not present

## 2018-02-10 DIAGNOSIS — M1612 Unilateral primary osteoarthritis, left hip: Secondary | ICD-10-CM | POA: Diagnosis present

## 2018-02-10 DIAGNOSIS — Z419 Encounter for procedure for purposes other than remedying health state, unspecified: Secondary | ICD-10-CM

## 2018-02-10 DIAGNOSIS — R7303 Prediabetes: Secondary | ICD-10-CM | POA: Diagnosis present

## 2018-02-10 DIAGNOSIS — Z885 Allergy status to narcotic agent status: Secondary | ICD-10-CM

## 2018-02-10 DIAGNOSIS — M25552 Pain in left hip: Secondary | ICD-10-CM | POA: Diagnosis present

## 2018-02-10 DIAGNOSIS — Z96642 Presence of left artificial hip joint: Secondary | ICD-10-CM

## 2018-02-10 HISTORY — PX: TOTAL HIP ARTHROPLASTY: SHX124

## 2018-02-10 SURGERY — ARTHROPLASTY, HIP, TOTAL, ANTERIOR APPROACH
Anesthesia: Spinal | Laterality: Left

## 2018-02-10 MED ORDER — BUPIVACAINE-EPINEPHRINE (PF) 0.25% -1:200000 IJ SOLN
INTRAMUSCULAR | Status: AC
  Filled 2018-02-10: qty 30

## 2018-02-10 MED ORDER — PROPOFOL 10 MG/ML IV BOLUS
INTRAVENOUS | Status: AC
  Filled 2018-02-10: qty 20

## 2018-02-10 MED ORDER — SODIUM CHLORIDE 0.9 % IV SOLN
INTRAVENOUS | Status: DC | PRN
  Administered 2018-02-10: 40 ug/min via INTRAVENOUS

## 2018-02-10 MED ORDER — FAMOTIDINE 20 MG PO TABS
ORAL_TABLET | ORAL | Status: AC
  Administered 2018-02-10: 20 mg via ORAL
  Filled 2018-02-10: qty 1

## 2018-02-10 MED ORDER — SCOPOLAMINE 1 MG/3DAYS TD PT72
MEDICATED_PATCH | TRANSDERMAL | Status: AC
  Filled 2018-02-10: qty 1

## 2018-02-10 MED ORDER — ACETAMINOPHEN 10 MG/ML IV SOLN
INTRAVENOUS | Status: DC | PRN
  Administered 2018-02-10: 1000 mg via INTRAVENOUS

## 2018-02-10 MED ORDER — CEFAZOLIN SODIUM-DEXTROSE 2-4 GM/100ML-% IV SOLN
INTRAVENOUS | Status: AC
  Filled 2018-02-10: qty 100

## 2018-02-10 MED ORDER — LACTATED RINGERS IV SOLN
INTRAVENOUS | Status: DC
  Administered 2018-02-10: 09:00:00 via INTRAVENOUS

## 2018-02-10 MED ORDER — PROPOFOL 500 MG/50ML IV EMUL
INTRAVENOUS | Status: AC
  Filled 2018-02-10: qty 50

## 2018-02-10 MED ORDER — PROPOFOL 10 MG/ML IV BOLUS
INTRAVENOUS | Status: DC | PRN
  Administered 2018-02-10: 50 mg via INTRAVENOUS

## 2018-02-10 MED ORDER — BUPIVACAINE HCL (PF) 0.25 % IJ SOLN
INTRAMUSCULAR | Status: AC
  Filled 2018-02-10: qty 30

## 2018-02-10 MED ORDER — BUPIVACAINE-EPINEPHRINE 0.25% -1:200000 IJ SOLN
INTRAMUSCULAR | Status: DC | PRN
  Administered 2018-02-10: 30 mL

## 2018-02-10 MED ORDER — ACETAMINOPHEN 10 MG/ML IV SOLN
INTRAVENOUS | Status: AC
  Filled 2018-02-10: qty 100

## 2018-02-10 MED ORDER — PHENYLEPHRINE HCL 10 MG/ML IJ SOLN
INTRAMUSCULAR | Status: DC | PRN
  Administered 2018-02-10 (×3): 100 ug via INTRAVENOUS

## 2018-02-10 MED ORDER — TRAMADOL HCL 50 MG PO TABS
50.0000 mg | ORAL_TABLET | Freq: Four times a day (QID) | ORAL | Status: DC
  Administered 2018-02-10 – 2018-02-13 (×7): 50 mg via ORAL
  Filled 2018-02-10 (×9): qty 1

## 2018-02-10 MED ORDER — SODIUM CHLORIDE 0.9 % IV SOLN
INTRAVENOUS | Status: DC
  Administered 2018-02-10: 15:00:00 via INTRAVENOUS

## 2018-02-10 MED ORDER — DIPHENHYDRAMINE HCL 12.5 MG/5ML PO ELIX: 12.5000 mg | ORAL_SOLUTION | ORAL | Status: DC | PRN

## 2018-02-10 MED ORDER — METOCLOPRAMIDE HCL 5 MG/ML IJ SOLN
5.0000 mg | Freq: Three times a day (TID) | INTRAMUSCULAR | Status: DC | PRN
  Administered 2018-02-11 (×2): 10 mg via INTRAVENOUS
  Filled 2018-02-10 (×2): qty 2

## 2018-02-10 MED ORDER — ONDANSETRON HCL 4 MG/2ML IJ SOLN
4.0000 mg | Freq: Four times a day (QID) | INTRAMUSCULAR | Status: DC | PRN
  Administered 2018-02-10 – 2018-02-12 (×2): 4 mg via INTRAVENOUS
  Filled 2018-02-10 (×2): qty 2

## 2018-02-10 MED ORDER — FAMOTIDINE 20 MG PO TABS
20.0000 mg | ORAL_TABLET | Freq: Once | ORAL | Status: AC
  Administered 2018-02-10: 20 mg via ORAL

## 2018-02-10 MED ORDER — ALUM & MAG HYDROXIDE-SIMETH 200-200-20 MG/5ML PO SUSP
30.0000 mL | ORAL | Status: DC | PRN
  Administered 2018-02-11: 30 mL via ORAL
  Filled 2018-02-10: qty 30

## 2018-02-10 MED ORDER — ACETAMINOPHEN 500 MG PO TABS
500.0000 mg | ORAL_TABLET | Freq: Four times a day (QID) | ORAL | Status: AC
  Administered 2018-02-10 – 2018-02-11 (×4): 500 mg via ORAL
  Filled 2018-02-10 (×4): qty 1

## 2018-02-10 MED ORDER — SCOPOLAMINE 1 MG/3DAYS TD PT72
MEDICATED_PATCH | TRANSDERMAL | Status: AC
  Administered 2018-02-10: 1.5 mg via TRANSDERMAL
  Filled 2018-02-10: qty 1

## 2018-02-10 MED ORDER — TRIAMCINOLONE ACETONIDE 0.1 % EX CREA
1.0000 "application " | TOPICAL_CREAM | Freq: Two times a day (BID) | CUTANEOUS | Status: DC
  Filled 2018-02-10: qty 15

## 2018-02-10 MED ORDER — ONDANSETRON HCL 4 MG PO TABS
4.0000 mg | ORAL_TABLET | Freq: Four times a day (QID) | ORAL | Status: DC | PRN
  Administered 2018-02-13: 4 mg via ORAL
  Filled 2018-02-10: qty 1

## 2018-02-10 MED ORDER — BUPIVACAINE HCL (PF) 0.5 % IJ SOLN
INTRAMUSCULAR | Status: DC | PRN
  Administered 2018-02-10: 3 mL

## 2018-02-10 MED ORDER — BISACODYL 5 MG PO TBEC
5.0000 mg | DELAYED_RELEASE_TABLET | Freq: Every day | ORAL | Status: DC | PRN
  Administered 2018-02-11: 5 mg via ORAL
  Filled 2018-02-10: qty 1

## 2018-02-10 MED ORDER — MECLIZINE HCL 25 MG PO TABS
25.0000 mg | ORAL_TABLET | Freq: Three times a day (TID) | ORAL | Status: DC | PRN
  Filled 2018-02-10: qty 1

## 2018-02-10 MED ORDER — FENTANYL CITRATE (PF) 100 MCG/2ML IJ SOLN: 25.0000 ug | INTRAMUSCULAR | Status: DC | PRN

## 2018-02-10 MED ORDER — ACETAMINOPHEN 325 MG PO TABS: 325.0000 mg | ORAL_TABLET | Freq: Four times a day (QID) | ORAL | Status: DC | PRN

## 2018-02-10 MED ORDER — MEPERIDINE HCL 50 MG/ML IJ SOLN
INTRAMUSCULAR | Status: AC
  Administered 2018-02-10: 12.5 mg via INTRAVENOUS
  Filled 2018-02-10: qty 1

## 2018-02-10 MED ORDER — BUPIVACAINE HCL (PF) 0.5 % IJ SOLN
INTRAMUSCULAR | Status: AC
  Filled 2018-02-10: qty 10

## 2018-02-10 MED ORDER — SENNOSIDES-DOCUSATE SODIUM 8.6-50 MG PO TABS: 1.0000 | ORAL_TABLET | Freq: Every evening | ORAL | Status: DC | PRN

## 2018-02-10 MED ORDER — ASPIRIN 81 MG PO CHEW
81.0000 mg | CHEWABLE_TABLET | Freq: Two times a day (BID) | ORAL | Status: DC
  Administered 2018-02-10 – 2018-02-13 (×6): 81 mg via ORAL
  Filled 2018-02-10 (×6): qty 1

## 2018-02-10 MED ORDER — NEOMYCIN-POLYMYXIN B GU 40-200000 IR SOLN
Status: AC
  Filled 2018-02-10: qty 4

## 2018-02-10 MED ORDER — MENTHOL 3 MG MT LOZG: 1.0000 | LOZENGE | OROMUCOSAL | Status: DC | PRN

## 2018-02-10 MED ORDER — MEPERIDINE HCL 50 MG/ML IJ SOLN
12.5000 mg | Freq: Once | INTRAMUSCULAR | Status: AC
  Administered 2018-02-10: 12.5 mg via INTRAVENOUS

## 2018-02-10 MED ORDER — CEFAZOLIN SODIUM-DEXTROSE 2-4 GM/100ML-% IV SOLN
2.0000 g | Freq: Four times a day (QID) | INTRAVENOUS | Status: AC
  Administered 2018-02-10 – 2018-02-11 (×3): 2 g via INTRAVENOUS
  Filled 2018-02-10 (×3): qty 100

## 2018-02-10 MED ORDER — NEOMYCIN-POLYMYXIN B GU 40-200000 IR SOLN
Status: DC | PRN
  Administered 2018-02-10: 4 mL

## 2018-02-10 MED ORDER — TRAMADOL HCL 50 MG PO TABS
50.0000 mg | ORAL_TABLET | Freq: Four times a day (QID) | ORAL | Status: DC | PRN
  Administered 2018-02-10: 50 mg via ORAL
  Administered 2018-02-11: 100 mg via ORAL
  Filled 2018-02-10: qty 2
  Filled 2018-02-10: qty 1

## 2018-02-10 MED ORDER — METHOCARBAMOL 500 MG PO TABS
500.0000 mg | ORAL_TABLET | Freq: Four times a day (QID) | ORAL | Status: DC | PRN
  Administered 2018-02-10 – 2018-02-11 (×2): 500 mg via ORAL
  Filled 2018-02-10 (×3): qty 1

## 2018-02-10 MED ORDER — METOCLOPRAMIDE HCL 10 MG PO TABS: 5.0000 mg | ORAL_TABLET | Freq: Three times a day (TID) | ORAL | Status: DC | PRN

## 2018-02-10 MED ORDER — SCOPOLAMINE 1 MG/3DAYS TD PT72
1.0000 | MEDICATED_PATCH | TRANSDERMAL | Status: DC
  Administered 2018-02-10: 1.5 mg via TRANSDERMAL
  Filled 2018-02-10: qty 1

## 2018-02-10 MED ORDER — DOCUSATE SODIUM 100 MG PO CAPS
100.0000 mg | ORAL_CAPSULE | Freq: Two times a day (BID) | ORAL | Status: DC
  Administered 2018-02-10 – 2018-02-13 (×6): 100 mg via ORAL
  Filled 2018-02-10 (×6): qty 1

## 2018-02-10 MED ORDER — MORPHINE SULFATE (PF) 2 MG/ML IV SOLN
0.5000 mg | INTRAVENOUS | Status: DC | PRN
  Administered 2018-02-10: 1 mg via INTRAVENOUS
  Administered 2018-02-10: 0.5 mg via INTRAVENOUS
  Administered 2018-02-10: 1 mg via INTRAVENOUS
  Filled 2018-02-10 (×3): qty 1

## 2018-02-10 MED ORDER — MAGNESIUM CITRATE PO SOLN
1.0000 | Freq: Once | ORAL | Status: AC | PRN
  Administered 2018-02-12: 1 via ORAL
  Filled 2018-02-10: qty 296

## 2018-02-10 MED ORDER — HYDROCHLOROTHIAZIDE 25 MG PO TABS
25.0000 mg | ORAL_TABLET | Freq: Every day | ORAL | Status: DC
  Administered 2018-02-10 – 2018-02-12 (×3): 25 mg via ORAL
  Filled 2018-02-10 (×3): qty 1

## 2018-02-10 MED ORDER — PHENOL 1.4 % MT LIQD: 1.0000 | OROMUCOSAL | Status: DC | PRN

## 2018-02-10 MED ORDER — ZOLPIDEM TARTRATE 5 MG PO TABS
5.0000 mg | ORAL_TABLET | Freq: Every evening | ORAL | Status: DC | PRN
  Administered 2018-02-10: 5 mg via ORAL
  Filled 2018-02-10: qty 1

## 2018-02-10 MED ORDER — METHOCARBAMOL 1000 MG/10ML IJ SOLN
500.0000 mg | Freq: Four times a day (QID) | INTRAVENOUS | Status: DC | PRN
  Filled 2018-02-10: qty 5

## 2018-02-10 MED ORDER — PROPOFOL 500 MG/50ML IV EMUL
INTRAVENOUS | Status: DC | PRN
  Administered 2018-02-10: 75 ug/kg/min via INTRAVENOUS

## 2018-02-10 MED ORDER — BRIMONIDINE TARTRATE 0.15 % OP SOLN
1.0000 [drp] | Freq: Two times a day (BID) | OPHTHALMIC | Status: DC
  Filled 2018-02-10: qty 5

## 2018-02-10 SURGICAL SUPPLY — 58 items
BLADE SAGITTAL AGGR TOOTH XLG (BLADE) ×3 IMPLANT
BNDG COHESIVE 6X5 TAN STRL LF (GAUZE/BANDAGES/DRESSINGS) ×9 IMPLANT
CANISTER SUCT 1200ML W/VALVE (MISCELLANEOUS) ×3 IMPLANT
CHLORAPREP W/TINT 26ML (MISCELLANEOUS) ×3 IMPLANT
COVER WAND RF STERILE (DRAPES) ×3 IMPLANT
DRAPE C-ARM XRAY 36X54 (DRAPES) ×3 IMPLANT
DRAPE INCISE IOBAN 66X60 STRL (DRAPES) ×3 IMPLANT
DRAPE POUCH INSTRU U-SHP 10X18 (DRAPES) ×3 IMPLANT
DRAPE SHEET LG 3/4 BI-LAMINATE (DRAPES) ×9 IMPLANT
DRAPE TABLE BACK 80X90 (DRAPES) ×3 IMPLANT
DRESSING SURGICEL FIBRLLR 1X2 (HEMOSTASIS) ×2 IMPLANT
DRSG OPSITE POSTOP 4X8 (GAUZE/BANDAGES/DRESSINGS) ×6 IMPLANT
DRSG SURGICEL FIBRILLAR 1X2 (HEMOSTASIS) ×6
ELECT BLADE 6.5 EXT (BLADE) ×3 IMPLANT
ELECT REM PT RETURN 9FT ADLT (ELECTROSURGICAL) ×3
ELECTRODE REM PT RTRN 9FT ADLT (ELECTROSURGICAL) ×1 IMPLANT
GLOVE BIOGEL PI IND STRL 9 (GLOVE) ×1 IMPLANT
GLOVE BIOGEL PI INDICATOR 9 (GLOVE) ×2
GLOVE SURG SYN 9.0  PF PI (GLOVE) ×4
GLOVE SURG SYN 9.0 PF PI (GLOVE) ×2 IMPLANT
GOWN SRG 2XL LVL 4 RGLN SLV (GOWNS) ×1 IMPLANT
GOWN STRL NON-REIN 2XL LVL4 (GOWNS) ×2
GOWN STRL REUS W/ TWL LRG LVL3 (GOWN DISPOSABLE) ×1 IMPLANT
GOWN STRL REUS W/TWL LRG LVL3 (GOWN DISPOSABLE) ×2
HEMOVAC 400CC 10FR (MISCELLANEOUS) ×3 IMPLANT
HIP FEM HD S 28 (Head) ×3 IMPLANT
HOLDER FOLEY CATH W/STRAP (MISCELLANEOUS) ×3 IMPLANT
HOOD PEEL AWAY FLYTE STAYCOOL (MISCELLANEOUS) ×3 IMPLANT
KIT PREVENA INCISION MGT 13 (CANNISTER) ×3 IMPLANT
LINER DUAL MOB 50MM (Liner) ×3 IMPLANT
MAT ABSORB  FLUID 56X50 GRAY (MISCELLANEOUS) ×2
MAT ABSORB FLUID 56X50 GRAY (MISCELLANEOUS) ×1 IMPLANT
NDL SAFETY ECLIPSE 18X1.5 (NEEDLE) ×1 IMPLANT
NEEDLE HYPO 18GX1.5 SHARP (NEEDLE) ×2
NEEDLE SPNL 18GX3.5 QUINCKE PK (NEEDLE) ×3 IMPLANT
NS IRRIG 1000ML POUR BTL (IV SOLUTION) ×3 IMPLANT
PACK HIP COMPR (MISCELLANEOUS) ×3 IMPLANT
SCALPEL PROTECTED #10 DISP (BLADE) ×6 IMPLANT
SHELL ACETABULAR SZ0 50 DME (Shell) ×3 IMPLANT
SOL PREP PVP 2OZ (MISCELLANEOUS) ×3
SOLUTION PREP PVP 2OZ (MISCELLANEOUS) ×1 IMPLANT
SPONGE DRAIN TRACH 4X4 STRL 2S (GAUZE/BANDAGES/DRESSINGS) ×3 IMPLANT
STAPLER SKIN PROX 35W (STAPLE) ×3 IMPLANT
STEM FEMORAL SZ2 STD COLLARED (Stem) ×3 IMPLANT
STRAP SAFETY 5IN WIDE (MISCELLANEOUS) ×3 IMPLANT
SUT DVC 2 QUILL PDO  T11 36X36 (SUTURE) ×2
SUT DVC 2 QUILL PDO T11 36X36 (SUTURE) ×1 IMPLANT
SUT SILK 0 (SUTURE) ×2
SUT SILK 0 30XBRD TIE 6 (SUTURE) ×1 IMPLANT
SUT V-LOC 90 ABS DVC 3-0 CL (SUTURE) ×3 IMPLANT
SUT VIC AB 1 CT1 36 (SUTURE) ×3 IMPLANT
SYR 20CC LL (SYRINGE) ×3 IMPLANT
SYR 30ML LL (SYRINGE) ×3 IMPLANT
SYR BULB IRRIG 60ML STRL (SYRINGE) ×3 IMPLANT
TAPE MICROFOAM 4IN (TAPE) ×3 IMPLANT
TOWEL OR 17X26 4PK STRL BLUE (TOWEL DISPOSABLE) ×3 IMPLANT
TRAY FOLEY MTR SLVR 16FR STAT (SET/KITS/TRAYS/PACK) ×3 IMPLANT
WND VAC CANISTER 500ML (MISCELLANEOUS) ×3 IMPLANT

## 2018-02-10 NOTE — Progress Notes (Signed)
Informed Dr. Rudene Christians of EKG and Dr. Amie Critchley recommendation for tele monitoring on ortho floor.  Dr. Rudene Christians to put in order for tele monitoring.

## 2018-02-10 NOTE — Anesthesia Preprocedure Evaluation (Signed)
Anesthesia Evaluation  Patient identified by MRN, date of birth, ID band Patient awake    Reviewed: Allergy & Precautions, H&P , NPO status , Patient's Chart, lab work & pertinent test results  History of Anesthesia Complications (+) PONV and history of anesthetic complications  Airway Mallampati: II  TM Distance: >3 FB Neck ROM: full    Dental  (+) Chipped, Missing, Edentulous Upper   Pulmonary neg pulmonary ROS, neg shortness of breath,           Cardiovascular Exercise Tolerance: Good hypertension, (-) angina(-) Past MI and (-) DOE      Neuro/Psych negative neurological ROS  negative psych ROS   GI/Hepatic negative GI ROS, Neg liver ROS, neg GERD  ,  Endo/Other  negative endocrine ROS  Renal/GU negative Renal ROS  negative genitourinary   Musculoskeletal  (+) Arthritis ,   Abdominal   Peds  Hematology negative hematology ROS (+)   Anesthesia Other Findings Past Medical History: No date: Arthritis No date: Complication of anesthesia     Comment:  terrible vertigo No date: Hypertension No date: PONV (postoperative nausea and vomiting)     Comment:  related to vertigo No date: Shortness of breath     Comment:  WITH EXERTION No date: Vertigo     Comment:  hx post op  Past Surgical History: 2010: BACK SURGERY     Comment:  cervical fusion 03/31/2011: BREAST BIOPSY; Left     Comment:  fibrocystic changes, marked stromal sclerosis, and dense              stromal calcifications 03/31/2011: BREAST BIOPSY; Left 03/31/2011: BREAST BIOPSY; Right     Comment:  fine needle aspiration with clip placement - ultrasound 2005: BREAST SURGERY; Left     Comment:  biopsy No date: CARPAL TUNNEL RELEASE; Right No date: FOOT SURGERY; Left     Comment:  heel---STATES METAL IN THE HEEL - FOOT TURNS OUTWARD No date: HEEL SPUR SURGERY 07/26/2015: HYSTEROSCOPY W/D&C; N/A     Comment:  Procedure: DILATATION AND CURETTAGE  /HYSTEROSCOPY with               cervical biopsy;  Surgeon: Benjaman Kindler, MD;                Location: [facility] ORS;  Service: Gynecology;  Laterality:               N/A; 2013: JOINT REPLACEMENT; Left     Comment:  knee 05/29/2013: KNEE ARTHROTOMY; Left     Comment:  Procedure: LEFT KNEE ARTHROTOMY WITH SCAR EXCISION;                Surgeon: Gearlean Alf, MD;  Location: [facility];                Service: Orthopedics;  Laterality: Left; 03/05/2014: KNEE ARTHROTOMY; Left     Comment:  Procedure: LEFT KNEE ARTHROTOMY/SCAR               EXCISION/POLYETHYLENE REVISION;  Surgeon: Gearlean Alf, MD;  Location: [facility];  Service: Orthopedics;                Laterality: Left; 02/15/2013: KNEE CLOSED REDUCTION; Left     Comment:  Procedure: CLOSED MANIPULATION LEFT KNEE;  Surgeon:               Gearlean Alf, MD;  Location: [facility];  Service:               Orthopedics;  Laterality: Left; 12/24/2015: TOTAL HIP ARTHROPLASTY; Right     Comment:  Procedure: TOTAL HIP ARTHROPLASTY ANTERIOR APPROACH;                Surgeon: Hessie Knows, MD;  Location: [facility] ORS;  Service:              Orthopedics;  Laterality: Right; 12/07/2012: TOTAL KNEE REVISION; Left     Comment:  Procedure:  TOTAL KNEE ARTHROPLASTY REVISION;  Surgeon:               Gearlean Alf, MD;  Location: [facility];  Service:               Orthopedics;  Laterality: Left; No date: TUBAL LIGATION  BMI    Body Mass Index:  33.00 kg/m      Reproductive/Obstetrics negative OB ROS                             Anesthesia Physical Anesthesia Plan  ASA: III  Anesthesia Plan: Spinal   Post-op Pain Management:    Induction:   PONV Risk Score and Plan: Propofol infusion and TIVA  Airway Management Planned: Natural Airway and Nasal Cannula  Additional Equipment:   Intra-op Plan:   Post-operative Plan:   Informed Consent: I have reviewed the patients History and Physical, chart, labs and  discussed the procedure including the risks, benefits and alternatives for the proposed anesthesia with the patient or authorized representative who has indicated his/her understanding and acceptance.   Dental Advisory Given  Plan Discussed with: Anesthesiologist, CRNA and Surgeon  Anesthesia Plan Comments: (Patient reports no bleeding problems and no anticoagulant use.  Plan for spinal with backup GA  Patient consented for risks of anesthesia including but not limited to:  - adverse reactions to medications - risk of bleeding, infection, nerve damage and headache - risk of failed spinal - damage to teeth, lips or other oral mucosa - sore throat or hoarseness - Damage to heart, brain, lungs or loss of life  Patient voiced understanding.)        Anesthesia Quick Evaluation

## 2018-02-10 NOTE — Op Note (Signed)
02/10/2018  11:37 AM  PATIENT:  [name] [name]  67 y.o. female  PRE-OPERATIVE DIAGNOSIS:  primary localized osteoarthritis of left hip  POST-OPERATIVE DIAGNOSIS:  primary localized osteoarthritis of left hip  PROCEDURE:  Procedure(s): TOTAL HIP ARTHROPLASTY ANTERIOR APPROACH (Left)  SURGEON: Laurene Footman, MD  ASSISTANTS: None  ANESTHESIA:   spinal  EBL:  Total I/O In: -  Out: 400 [Urine:300; Blood:100]  BLOOD ADMINISTERED:none  DRAINS: (two) Hemovact drain(s) in the Subcutaneous layer with  Suction Open   LOCAL MEDICATIONS USED:  MARCAINE     SPECIMEN:  Source of Specimen:  Left femoral head  DISPOSITION OF SPECIMEN:  PATHOLOGY  COUNTS:  YES  TOURNIQUET:  * No tourniquets in log *  IMPLANTS: Medecta AMIS 2 standard stem with 50 mm Mpact DM cup and liner with metal S 28 mm head  DICTATION: .Dragon Dictation   The patient was brought to the operating room and after spinal anesthesia was obtained patient was placed on the operative table with the ipsilateral foot into the Medacta attachment, contralateral leg on a well-padded table. C-arm was brought in and preop template x-ray taken. After prepping and draping in usual sterile fashion appropriate patient identification and timeout procedures were completed. Anterior approach to the hip was obtained and centered over the greater trochanter and TFL muscle. The subcutaneous tissue was incised hemostasis being achieved by electrocautery. TFL fascia was incised and the muscle retracted laterally deep retractor placed. The lateral femoral circumflex vessels were identified and ligated. The anterior capsule was exposed and a capsulotomy performed. The neck was identified and a femoral neck cut carried out with a saw. The head was removed without difficulty and showed sclerotic femoral head and acetabulum. Reaming was carried out to 50 mm and a 50 mm cup trial gave appropriate tightness to the acetabular component a 50 DM cup was  impacted into position. The leg was then externally rotated and ischiofemoral and pubofemoral releases carried out. The femur was sequentially broached to a size 2, size 2 standard with S head trials were placed and the final components chosen. The 2 standard stem was inserted along with a metal S 28 mm head and 50 mm liner. The hip was reduced and was stable the wound was thoroughly irrigated with fibrillar placed along the posterior capsule and medial neck. The deep fascia ws closed using a heavy Quill after infiltration of 30 cc of quarter percent Sensorcaine with epinephrine.3-0 V-loc to close the skin with skin staples.  Hemovac drains were placed in the subcutaneous layer prior to skin closure and incisional wound VAC applied  PLAN OF CARE: Admit to inpatient

## 2018-02-10 NOTE — Progress Notes (Signed)
Knee on pillow left

## 2018-02-10 NOTE — H&P (Signed)
Reviewed paper H+P, will be scanned into chart. No changes noted.  

## 2018-02-10 NOTE — Care Management (Signed)
RNCM attempted to meet with patient but she was having pain. RN at bedside.

## 2018-02-10 NOTE — Anesthesia Post-op Follow-up Note (Signed)
Anesthesia QCDR form completed.        

## 2018-02-10 NOTE — Progress Notes (Signed)
Informed Dr. Amie Critchley of EKG results.  Recommends pt to have tele monitoring on floor

## 2018-02-10 NOTE — Evaluation (Signed)
Physical Therapy Evaluation Patient Details Name: [name] [name] MRN: [id number] DOB: August 25, 1950 Today's Date: 02/10/2018   History of Present Illness  Pt is a 67 y.o female s/p L THR via anterior approach. PMH includes HTN, anemia, arthritis, C4-5/5-6 fusion, L TKA with 2 revisions/manipulations, and R THA  Clinical Impression  Upon arrival pt in significant 10/10 pain with verbal moaning t/o evaluation. Pt unable to keep still during evaluation needing to constantly reposition for pain relief. PROM of L LE with minimal movement is not tolerated well at this time however performed briefly to assess possible relief. Pt struggling with basic exercises with poor tolerance and active assist required. Pt indicating L knee is limiting as well however pt primarily limited this afternoon by pain in hip. Bed mobility, transfers, and initial ambulation held this date due to pain and will be assessed in next session. Based on current functional status, pt will benefit most from continued skilled PT in STR due to address significant limitations in basic mobility and pain.     Follow Up Recommendations SNF    Equipment Recommendations       Recommendations for Other Services       Precautions / Restrictions Precautions Precautions: Anterior Hip Precaution Booklet Issued: Yes (comment) Restrictions Weight Bearing Restrictions: Yes LLE Weight Bearing: Weight bearing as tolerated      Mobility  Bed Mobility               General bed mobility comments: unable to assess pt in significant pain  Transfers                 General transfer comment: unable to assess pt in significant pain  Ambulation/Gait             General Gait Details: unable to assess pt in significant pain  Stairs            Wheelchair Mobility    Modified Rankin (Stroke Patients Only)       Balance Overall balance assessment: (unable to assess pt in significant pain)                                            Pertinent Vitals/Pain Pain Assessment: 0-10 Pain Score: 10-Worst pain ever Pain Location: L hip Pain Intervention(s): Ice applied;Premedicated before session;Limited activity within patient's tolerance;Monitored during session    Home Living Family/patient expects to be discharged to:: Private residence Living Arrangements: Alone Available Help at Discharge: Family;Friend(s) Type of Home: House Home Access: Ramped entrance     Home Layout: One level Home Equipment: Environmental consultant - 2 wheels;Cane - single point;Toilet riser;Grab bars - tub/shower      Prior Function Level of Independence: Independent with assistive device(s)         Comments: SPC for community ambulation, active lifestyle     Hand Dominance        Extremity/Trunk Assessment   Upper Extremity Assessment Upper Extremity Assessment: Overall WFL for tasks assessed    Lower Extremity Assessment Lower Extremity Assessment: Generalized weakness(L LE with expected post op deficits, R LE WFL)       Communication   Communication: No difficulties  Cognition Arousal/Alertness: Awake/alert Behavior During Therapy: WFL for tasks assessed/performed Overall Cognitive Status: Within Functional Limits for tasks assessed  General Comments      Exercises Total Joint Exercises Ankle Circles/Pumps: AROM;Both;10 reps;Supine Quad Sets: AAROM;Left;10 reps;Supine Gluteal Sets: AROM;Both;10 reps;Supine Hip ABduction/ADduction: PROM;Left;10 reps;Supine   Assessment/Plan    PT Assessment Patient needs continued PT services  PT Problem List Decreased strength;Decreased range of motion;Decreased activity tolerance;Decreased balance;Decreased mobility;Decreased knowledge of use of DME;Decreased safety awareness;Cardiopulmonary status limiting activity       PT Treatment Interventions DME instruction;Balance  training;Modalities;Gait training;Stair training;Functional mobility training;Patient/family education;Therapeutic activities;Therapeutic exercise    PT Goals (Current goals can be found in the Care Plan section)  Acute Rehab PT Goals Patient Stated Goal: have less pain  PT Goal Formulation: With patient Time For Goal Achievement: 02/24/18 Potential to Achieve Goals: Good    Frequency BID   Barriers to discharge        Co-evaluation               AM-PAC PT "6 Clicks" Daily Activity  Outcome Measure Difficulty turning over in bed (including adjusting bedclothes, sheets and blankets)?: Unable Difficulty moving from lying on back to sitting on the side of the bed? : Unable Difficulty sitting down on and standing up from a chair with arms (e.g., wheelchair, bedside commode, etc,.)?: Unable Help needed moving to and from a bed to chair (including a wheelchair)?: Total Help needed walking in hospital room?: Total Help needed climbing 3-5 steps with a railing? : Total 6 Click Score: 6    End of Session Equipment Utilized During Treatment: Gait belt Activity Tolerance: Patient limited by pain Patient left: in bed;with bed alarm set;with SCD's reapplied Nurse Communication: Mobility status PT Visit Diagnosis: Other abnormalities of gait and mobility (R26.89);Muscle weakness (generalized) (M62.81);Pain;Difficulty in walking, not elsewhere classified (R26.2) Pain - Right/Left: Left Pain - part of body: Hip    Time: 7035-0093 PT Time Calculation (min) (ACUTE ONLY): 23 min   Charges:              Ernie Avena, SPT 02/10/2018, 4:32 PM

## 2018-02-10 NOTE — NC FL2 (Signed)
[place] LEVEL OF CARE SCREENING TOOL     IDENTIFICATION  Patient Name: [name] [name] Birthdate: 12-02-1950 Sex: female Admission Date (Current Location): 02/10/2018  [place] and [place] Number:  Engineering geologist and Address:  [facility], [address], [place], [place] [address]      Provider Number: [id number]  Attending Physician Name and Address:  Hessie Knows, MD  Relative Name and Phone Number:       Current Level of Care: Hospital Recommended Level of Care: [place] Prior Approval Number:    Date Approved/Denied:   PASRR Number: [id number])  Discharge Plan: SNF    Current Diagnoses: Patient Active Problem List   Diagnosis Date Noted  . Status post total hip replacement, left 02/10/2018  . Primary localized osteoarthritis of right hip 12/24/2015  . Arthrofibrosis of knee joint 03/05/2014  . Hypokalemia 05/30/2013  . OA (osteoarthritis) of knee 05/29/2013  . Postoperative stiffness of total knee replacement ([place]) 02/15/2013  . Postoperative anemia due to acute blood loss 12/09/2012  . 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 )                       Personal Care Assistance Level of Assistance  Bathing, Feeding, Dressing Bathing Assistance: Limited assistance Feeding assistance: Independent Dressing Assistance: Limited assistance     Functional Limitations Info  Sight, Hearing, Speech Sight Info: Adequate Hearing Info: Adequate Speech Info: Adequate    SPECIAL CARE FACTORS  FREQUENCY  PT (By licensed PT), OT (By licensed OT)     PT Frequency: (5) OT Frequency: (5)            Contractures      Additional Factors Info  Code Status, Allergies Code Status Info: (Full Code. ) Allergies Info: ([place] Grass Extract, Hydrocodone, Oxycodone)           Current Medications (02/10/2018):  This is the current hospital active medication list Current Facility-Administered Medications  Medication Dose Route Frequency Provider Last Rate Last Dose  . 0.9 %  sodium chloride infusion   Intravenous Continuous Hessie Knows, MD 100 mL/hr at 02/10/18 1452    . [START ON 02/11/2018] acetaminophen (TYLENOL) tablet 325-650 mg  325-650 mg Oral Q6H PRN Hessie Knows, MD      . acetaminophen (TYLENOL) tablet 500 mg  500 mg Oral Q6H Hessie Knows, MD      . alum & mag hydroxide-simeth (MAALOX/MYLANTA) 200-200-20 MG/5ML suspension 30 mL  30 mL Oral Q4H PRN Hessie Knows, MD      . aspirin chewable tablet 81 mg  81 mg Oral BID Hessie Knows, MD      . bisacodyl (DULCOLAX) EC tablet 5 mg  5 mg Oral Daily PRN Hessie Knows, MD      . brimonidine (ALPHAGAN) 0.15 % ophthalmic solution 1 drop  1 drop Both Eyes BID Hessie Knows, MD      . ceFAZolin (ANCEF) IVPB 2g/100 mL premix  2 g Intravenous Q6H Hessie Knows, MD 200 mL/hr at 02/10/18 1454 2 g at 02/10/18  1454  . diphenhydrAMINE (BENADRYL) 12.5 MG/5ML elixir 12.5-25 mg  12.5-25 mg Oral Q4H PRN Hessie Knows, MD      . docusate sodium (COLACE) capsule 100 mg  100 mg Oral BID Hessie Knows, MD      . hydrochlorothiazide (HYDRODIURIL) tablet 25 mg  25 mg Oral QHS Hessie Knows, MD      . lactated ringers infusion   Intravenous Continuous Martha Clan, MD 75 mL/hr at 02/10/18 [phone]    . magnesium citrate solution 1 Bottle  1 Bottle Oral Once PRN Hessie Knows, MD      . meclizine (ANTIVERT) tablet 25 mg  25 mg Oral TID PRN Hessie Knows, MD      . menthol-cetylpyridinium (CEPACOL) lozenge 3 mg  1 lozenge Oral PRN Hessie Knows, MD        Or  . phenol (CHLORASEPTIC) mouth spray 1 spray  1 spray Mouth/Throat PRN Hessie Knows, MD      . methocarbamol (ROBAXIN) tablet 500 mg  500 mg Oral Q6H PRN Hessie Knows, MD   500 mg at 02/10/18 1459   Or  . methocarbamol (ROBAXIN) 500 mg in dextrose 5 % 50 mL IVPB  500 mg Intravenous Q6H PRN Hessie Knows, MD      . metoCLOPramide (REGLAN) tablet 5-10 mg  5-10 mg Oral Q8H PRN Hessie Knows, MD       Or  . metoCLOPramide (REGLAN) injection 5-10 mg  5-10 mg Intravenous Q8H PRN Hessie Knows, MD      . morphine 2 MG/ML injection 0.5-1 mg  0.5-1 mg Intravenous Q2H PRN Hessie Knows, MD      . ondansetron [facility]) tablet 4 mg  4 mg Oral Q6H PRN Hessie Knows, MD       Or  . ondansetron [facility]) injection 4 mg  4 mg Intravenous Q6H PRN Hessie Knows, MD      . scopolamine (TRANSDERM-SCOP) 1 MG/3DAYS 1.5 mg  1 patch Transdermal Q72H Martha Clan, MD   1.5 mg at 02/10/18 0845  . scopolamine (TRANSDERM-SCOP) 1 MG/3DAYS           . senna-docusate (Senokot-S) tablet 1 tablet  1 tablet Oral QHS PRN Hessie Knows, MD      . traMADol Veat[facility]bon) tablet 50 mg  50 mg Oral Q6H Hessie Knows, MD      . traMADol Veatrice [facility]bon) tablet 50-100 mg  50-100 mg Oral Q6H PRN Hessie Knows, MD   50 mg at 02/10/18 1451  . triamcinolone cream (KENALOG) 0.1 % 1 application  1 application Topical BID Hessie Knows, MD      . zolpidem [facility]) tablet 5 mg  5 mg Oral QHS PRN Hessie Knows, MD         Discharge Medications: Please see discharge summary for a list of discharge medications.  Relevant Imaging Results:  Relevant Lab Results:   Additional Information (SSN: [id number])  Zeah Germano, Veronia Beets, LCSW

## 2018-02-10 NOTE — Anesthesia Procedure Notes (Signed)
Spinal  Patient location during procedure: OR Start time: 02/10/2018 9:38 AM End time: 02/10/2018 9:48 AM Staffing Anesthesiologist: Brinda Focht, Precious Haws, MD Resident/CRNA: Disser, Einar Grad, CRNA Performed: anesthesiologist and resident/CRNA  Preanesthetic Checklist Completed: patient identified, site marked, surgical consent, pre-op evaluation, timeout performed, IV checked, risks and benefits discussed and monitors and equipment checked Spinal Block Patient position: sitting Prep: ChloraPrep Patient monitoring: heart rate, continuous pulse ox, blood pressure and cardiac monitor Approach: midline Location: L3-4 Injection technique: single-shot Needle Needle type: Whitacre and Introducer  Needle gauge: 24 G Needle length: 9 cm Assessment Sensory level: T10 Additional Notes First attempts by CRNA, final attempt by MDA  Negative paresthesia. Negative blood return. Positive free-flowing CSF. Expiration date of kit checked and confirmed. Patient tolerated procedure well, without complications.

## 2018-02-10 NOTE — Progress Notes (Signed)
Patient was admitted to room 147 from OR. Wound vac, hemovac, foley, all in place.  A&O x4. Reviewed POC and orders. Allowed time for questions. Orders for Tele, waiting to locate a box at this time.

## 2018-02-10 NOTE — Transfer of Care (Signed)
Immediate Anesthesia Transfer of Care Note  Patient: [name] [name]  Procedure(s) Performed: TOTAL HIP ARTHROPLASTY ANTERIOR APPROACH (Left )  Patient Location: PACU  Anesthesia Type:Spinal  Level of Consciousness: awake and patient cooperative  Airway & Oxygen Therapy: Patient Spontanous Breathing, Patient connected to nasal cannula oxygen and Patient connected to face mask oxygen  Post-op Assessment: Report given to RN and Post -op Vital signs reviewed and stable  Post vital signs: Reviewed and stable  Last Vitals:  Vitals Value Taken Time  BP 105/64 02/10/2018 11:39 AM  Temp 36.2 C 02/10/2018 11:39 AM  Pulse 75 02/10/2018 11:41 AM  Resp 28 02/10/2018 11:41 AM  SpO2 100 % 02/10/2018 11:41 AM  Vitals shown include unvalidated device data.  Last Pain:  Vitals:   02/10/18 0827  TempSrc: Oral  PainSc: 0-No pain         Complications: No apparent anesthesia complications

## 2018-02-11 LAB — BASIC METABOLIC PANEL
ANION GAP: 9 (ref 5–15)
BUN: 9 mg/dL (ref 8–23)
CALCIUM: 8.4 mg/dL — AB (ref 8.9–10.3)
CO2: 28 mmol/L (ref 22–32)
Chloride: 101 mmol/L (ref 98–111)
Creatinine, Ser: 0.75 mg/dL (ref 0.44–1.00)
GFR calc non Af Amer: >60 mL/min (ref >60)
Glucose, Bld: 152 mg/dL — ABNORMAL HIGH (ref 70–99)
Potassium: 3.6 mmol/L (ref 3.5–5.1)
Sodium: 138 mmol/L (ref 135–145)

## 2018-02-11 LAB — CBC
HEMATOCRIT: 39.5 % (ref 36.0–46.0)
Hemoglobin: 12.6 g/dL (ref 12.0–15.0)
MCH: 28.2 pg (ref 26.0–34.0)
MCHC: 31.9 g/dL (ref 30.0–36.0)
MCV: 88.4 fL (ref 80.0–100.0)
NRBC: 0 % (ref 0.0–0.2)
Platelets: 286 10*3/uL (ref 150–400)
RBC: 4.47 MIL/uL (ref 3.87–5.11)
RDW: 13.6 % (ref 11.5–15.5)
WBC: 11 10*3/uL — AB (ref 4.0–10.5)

## 2018-02-11 MED ORDER — TRAMADOL HCL 50 MG PO TABS: 50.0000 mg | ORAL_TABLET | Freq: Four times a day (QID) | ORAL | 0 refills | Status: DC | PRN

## 2018-02-11 MED ORDER — MAGNESIUM HYDROXIDE 400 MG/5ML PO SUSP
15.0000 mL | Freq: Every day | ORAL | Status: DC
  Filled 2018-02-11: qty 30

## 2018-02-11 MED ORDER — ASPIRIN 81 MG PO CHEW: 81.0000 mg | CHEWABLE_TABLET | Freq: Two times a day (BID) | ORAL | 0 refills | Status: AC

## 2018-02-11 NOTE — Clinical Social Work Note (Signed)
Clinical Social Work Assessment  Patient Details  Name: [name] [name] MRN: [id number] Date of Birth: January 24, 1951  Date of referral:  02/11/18               Reason for consult:  Facility Placement                Permission sought to share information with:    Permission granted to share information::     Name::        Agency::     Relationship::     Contact Information:     Housing/Transportation Living arrangements for the past 2 months:  Single Family Home Source of Information:  Patient Patient Interpreter Needed:  None Criminal Activity/Legal Involvement Pertinent to Current Situation/Hospitalization:  No - Comment as needed Significant Relationships:  Siblings Lives with:  Self Do you feel safe going back to the place where you live?  Yes Need for family participation in patient care:  Yes (Comment)  Care giving concerns:  Patient lives alone in [place].    Social Worker assessment / plan:  Holiday representative (CSW) recevied SNF consult. PT is recommending SNF. CSW met with patient alone at bedside to discuss D/C plan. Per patient she lives alone and her son passed away on this day several years ago. CSW provided emotional support. Per patient she has 2 sisters that live near her and can check on her often. CSW explained SNF process and that [facility] [place] will have to approve it. Per patient she works at [employer] on the weekends and is familiar with SNFs. Patient reported that she is not going to a SNF and will D/C home. Per patient she feels comfortable going home and taking care of herself. Per patient her sisters will provide support at home. Patient reported that she has a ramp and a walker. RN case manager aware of above and will set up home health. CSW will continue to follow and assist as needed.    Employment status:  Part-Time Nurse, adult PT Recommendations:  [place], Home with [place] / Referral to  community resources:  Other (Comment Required)(Patient refused SNF and will D/C home with home health. )  Patient/Family's Response to care:  Patient prefers to D/C home.   Patient/Family's Understanding of and Emotional Response to Diagnosis, Current Treatment, and Prognosis:  Patient was very pleasant and thanked CSW for assistance.   Emotional Assessment Appearance:  Appears stated age Attitude/Demeanor/Rapport:    Affect (typically observed):  Accepting, Adaptable, Pleasant Orientation:  Oriented to Self, Oriented to Place, Oriented to  Time, Oriented to Situation Alcohol / Substance use:  Not Applicable Psych involvement (Current and /or in the community):  No (Comment)  Discharge Needs  Concerns to be addressed:  Discharge Planning Concerns Readmission within the last 30 days:  No Current discharge risk:  Dependent with Mobility Barriers to Discharge:  Continued Medical Work up   [employer], Veronia Beets, LCSW 02/11/2018, 2:52 PM

## 2018-02-11 NOTE — Care Management Note (Signed)
Case Management Note  Patient Details  Name: [name] [name] MRN: [id number] Date of Birth: 06/08/1950  Subjective/Objective:                   RNCM met with patient to discuss transition of care needs. She is independent from home and baseline able to drive. 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 She has a reacher, bedside commode, front-wheel walker, toilet seat riser, sit in shower. [facility] has used [facility] in the past and would like to use them again.  She uses [name] [name] PCP is with [facility] clinic- Dr. Netty Starring. She states two sister [name] [name] present in room) and 2 nieces will assist her in the home and transporation.  Action/Plan:   Referral to [facility] with [facility].   Expected Discharge Date:  02/12/18               Expected Discharge Plan:     In-House Referral:     Discharge planning Services  CM Consult  Post Acute Care Choice:  Home Health Choice offered to:  Patient, Sibling  DME Arranged:    DME Agency:     HH Arranged:  PT [place]:  [place]  Status of Service:  In process, will continue to follow  If discussed at Long Length of Stay Meetings, dates discussed:    Additional Comments:  Marshell Garfinkel, RN 02/11/2018, 11:29 AM

## 2018-02-11 NOTE — Discharge Summary (Addendum)
Physician Discharge Summary  Patient ID: [name] [name] MRN: [id number] DOB/AGE: 1950/08/04 67 y.o.  Admit date: 02/10/2018 Discharge date: 02/13/2018 Admission Diagnoses:  primary localized osteoarthritis of left hip   Discharge Diagnoses: Patient Active Problem List   Diagnosis Date Noted  . Status post total hip replacement, left 02/10/2018  . Primary localized osteoarthritis of right hip 12/24/2015  . Arthrofibrosis of knee joint 03/05/2014  . Hypokalemia 05/30/2013  . OA (osteoarthritis) of knee 05/29/2013  . Postoperative stiffness of total knee replacement ([place]) 02/15/2013  . Postoperative anemia due to acute blood loss 12/09/2012  . 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 Arthritis   . Complication of anesthesia    terrible vertigo  . Hypertension   . PONV (postoperative nausea and vomiting)    related to vertigo  . Shortness of breath    WITH EXERTION  . Vertigo    hx post op     Transfusion: none   Consultants (if any):   Discharged Condition: Improved  Hospital Course: [name] [name] is an 67 y.o. female who was admitted 02/10/2018 with a diagnosis of <principal problem not specified> and went to the operating room on 02/10/2018 and underwent the above named procedures.    Surgeries: Procedure(s): TOTAL HIP ARTHROPLASTY ANTERIOR APPROACH on 02/10/2018 Patient tolerated the surgery well. Taken to PACU where she was stabilized and then transferred to the orthopedic floor.  Started on aspirin, SCDs, teds. Heels elevated on bed with rolled towels. No evidence of DVT. Negative [place]. Physical therapy started on day #1 for gait training and transfer. OT started day #1 for ADL and assisted devices.  Patient's foley was d/c on day #1. Patient's IV and hemovac was d/c on day #2.  On post op day #3 patient was stable and ready for discharge to home with home health PT.  Implants: Medecta AMIS 2  standard stem with 50 mm Mpact DM cup and liner with metal S 28 mm head  She was given perioperative antibiotics:  Anti-infectives (From admission, onward)   Start     Dose/Rate Route Frequency Ordered Stop   02/10/18 1600  ceFAZolin (ANCEF) IVPB 2g/100 mL premix     2 g 200 mL/hr over 30 Minutes Intravenous Every 6 hours 02/10/18 1334 02/11/18 0411   02/10/18 0829  ceFAZolin (ANCEF) 2-4 GM/100ML-% IVPB    Note to Pharmacy:  Sylvester Harder   : cabinet override      02/10/18 0829 02/10/18 1003   02/09/18 2230  ceFAZolin (ANCEF) IVPB 2g/100 mL premix     2 g 200 mL/hr over 30 Minutes Intravenous  Once 02/09/18 2224 02/10/18 1003    .  She was given sequential compression devices, early ambulation, and aspirin, teds for DVT prophylaxis.  She benefited maximally from the hospital stay and there were no complications.    Recent vital signs:  Vitals:   02/11/18 1320 02/11/18 1535  BP: 114/71 (!) 111/53  Pulse: (!) 59 74  Resp:    Temp: 97.9 F (36.6 C) 98.2 F (36.8 C)  SpO2: 95% 96%    Recent laboratory studies:  Lab Results  Component Value Date   HGB 12.6 02/11/2018   HGB 14.0 02/03/2018   HGB 11.1 (L) 12/27/2015   Lab Results  Component Value Date   WBC 11.0 (H) 02/11/2018   PLT 286 02/11/2018   Lab Results  Component Value Date   INR 1.03 02/03/2018  Lab Results  Component Value Date   NA 138 02/11/2018   K 3.6 02/11/2018   CL 101 02/11/2018   CO2 28 02/11/2018   BUN 9 02/11/2018   CREATININE 0.75 02/11/2018   GLUCOSE 152 (H) 02/11/2018    Discharge Medications:   Allergies as of 02/11/2018      Reactions   [place] Grass Extract Other (See Comments)   Allergy testing   Hydrocodone Itching   Oxycodone Other (See Comments)   vertigo      Medication List    STOP taking these medications   etodolac 500 MG tablet Commonly known as:  LODINE     TAKE these medications   aspirin 81 MG chewable tablet Chew 1 tablet (81 mg total) by mouth 2 (two)  times daily.   brimonidine 0.1 % Soln Commonly known as:  ALPHAGAN P Place 1 drop into both eyes 2 (two) times daily.   diphenhydrAMINE 25 MG tablet Commonly known as:  BENADRYL Take 25 mg by mouth daily as needed for allergies.   diphenhydramine-acetaminophen 25-500 MG Tabs tablet Commonly known as:  TYLENOL PM Take 1 tablet by mouth at bedtime as needed (sleep).   hydrochlorothiazide 25 MG tablet Commonly known as:  HYDRODIURIL Take 25 mg by mouth at bedtime.   meclizine 25 MG tablet Commonly known as:  ANTIVERT Take 1 tablet (25 mg total) by mouth 3 (three) times daily as needed for dizziness.   traMADol 50 MG tablet Commonly known as:  ULTRAM Take 1-2 tablets (50-100 mg total) by mouth every 6 (six) hours as needed for moderate pain.   triamcinolone cream 0.1 % Commonly known as:  KENALOG Apply 1 application topically daily as needed for rash.            Durable Medical Equipment  (From admission, onward)         Start     Ordered   02/10/18 1335  DME Walker rolling  Once    Question:  Patient needs a walker to treat with the following condition  Answer:  Status post total hip replacement, left   02/10/18 1334   02/10/18 1335  DME 3 n 1  Once     02/10/18 1334   02/10/18 1335  DME Bedside commode  Once    Question:  Patient needs a bedside commode to treat with the following condition  Answer:  Status post total hip replacement, left   02/10/18 1334          Diagnostic Studies: Dg Hip Operative Unilat W Or W/o Pelvis Left  Result Date: 02/10/2018 CLINICAL DATA:  Left hip replacement. EXAM: OPERATIVE left HIP (WITH PELVIS IF PERFORMED) 2 VIEWS TECHNIQUE: Fluoroscopic spot image(s) were submitted for interpretation post-operatively. COMPARISON:  None. FINDINGS: Intraoperative images of the hip demonstrate a left hip hemiarthroplasty. No radiographic complications. IMPRESSION: Left hip hemiarthroplasty without radiographic complication. Electronically Signed    By: San Morelle M.D.   On: 02/10/2018 12:54   Dg Hip Unilat W Or W/o Pelvis 2-3 Views Left  Result Date: 02/10/2018 CLINICAL DATA:  Left hip replacement. EXAM: DG HIP (WITH OR WITHOUT PELVIS) 2-3V LEFT COMPARISON:  None. FINDINGS: Single AP view of the pelvis demonstrates bilateral hip replacements. Heterotopic ossification is present about the right hip. Left hip prosthesis is unremarkable. IMPRESSION: Left hip replacement without radiographic evidence for complication. Electronically Signed   By: San Morelle M.D.   On: 02/10/2018 12:52    Disposition:  SignedDorise Hiss CHRISTOPHER 02/11/2018, 8:26 PM

## 2018-02-11 NOTE — Discharge Instructions (Signed)
ANTERIOR APPROACH TOTAL HIP REPLACEMENT POSTOPERATIVE DIRECTIONS   Hip Rehabilitation, Guidelines Following Surgery  The results of a hip operation are greatly improved after range of motion and muscle strengthening exercises. Follow all safety measures which are given to protect your hip. If any of these exercises cause increased pain or swelling in your joint, decrease the amount until you are comfortable again. Then slowly increase the exercises. Call your caregiver if you have problems or questions.   HOME CARE INSTRUCTIONS  Remove items at home which could result in a fall. This includes throw rugs or furniture in walking pathways.   ICE to the affected hip every three hours for 30 minutes at a time and then as needed for pain and swelling.  Continue to use ice on the hip for pain and swelling from surgery. You may notice swelling that will progress down to the foot and ankle.  This is normal after surgery.  Elevate the leg when you are not up walking on it.    Continue to use the breathing machine which will help keep your temperature down.  It is common for your temperature to cycle up and down following surgery, especially at night when you are not up moving around and exerting yourself.  The breathing machine keeps your lungs expanded and your temperature down.  Do not place pillow under knee, focus on keeping the knee straight while resting  DIET You may resume your previous home diet once your are discharged from the hospital.  DRESSING / WOUND CARE / SHOWERING Please remove provena negative pressure dressing on 02/19/2018 and apply honey comb dressing. Keep dressing clean and dry at all times.  ACTIVITY Walk with your walker as instructed. Use walker as long as suggested by your caregivers. Avoid periods of inactivity such as sitting longer than an hour when not asleep. This helps prevent blood clots.  You may resume a sexual relationship in one month or when given the OK by  your doctor.  You may return to work once you are cleared by your doctor.  Do not drive a car for 6 weeks or until released by you surgeon.  Do not drive while taking narcotics.  WEIGHT BEARING Weight bearing as tolerated. Use walker/cane as needed for at least 4 weeks post op.  POSTOPERATIVE CONSTIPATION PROTOCOL Constipation - defined medically as fewer than three stools per week and severe constipation as less than one stool per week.  One of the most common issues patients have following surgery is constipation.  Even if you have a regular bowel pattern at home, your normal regimen is likely to be disrupted due to multiple reasons following surgery.  Combination of anesthesia, postoperative narcotics, change in appetite and fluid intake all can affect your bowels.  In order to avoid complications following surgery, here are some recommendations in order to help you during your recovery period.  Colace (docusate) - Pick up an over-the-counter form of Colace or another stool softener and take twice a day as long as you are requiring postoperative pain medications.  Take with a full glass of water daily.  If you experience loose stools or diarrhea, hold the colace until you stool forms back up.  If your symptoms do not get better within 1 week or if they get worse, check with your doctor.  Dulcolax (bisacodyl) - Pick up over-the-counter and take as directed by the product packaging as needed to assist with the movement of your bowels.  Take with a full  glass of water.  Use this product as needed if not relieved by Colace only.  ° °MiraLax (polyethylene glycol) - Pick up over-the-counter to have on hand.  MiraLax is a solution that will increase the amount of water in your bowels to assist with bowel movements.  Take as directed and can mix with a glass of water, juice, soda, coffee, or tea.  Take if you go more than two days without a movement. °Do not use MiraLax more than once per day. Call your  doctor if you are still constipated or irregular after using this medication for 7 days in a row. ° °If you continue to have problems with postoperative constipation, please contact the office for further assistance and recommendations.  If you experience "the worst abdominal pain ever" or develop nausea or vomiting, please contact the office immediatly for further recommendations for treatment. ° °ITCHING ° If you experience itching with your medications, try taking only a single pain pill, or even half a pain pill at a time.  You can also use Benadryl over the counter for itching or also to help with sleep.  ° °TED HOSE STOCKINGS °Wear the elastic stockings on both legs for six weeks following surgery during the day but you may remove then at night for sleeping. ° °MEDICATIONS °See your medication summary on the “After Visit Summary” that the nursing staff will review with you prior to discharge.  You may have some home medications which will be placed on hold until you complete the course of blood thinner medication.  It is important for you to complete the blood thinner medication as prescribed by your surgeon.  Continue your approved medications as instructed at time of discharge. ° °PRECAUTIONS °If you experience chest pain or shortness of breath - call 911 immediately for transfer to the hospital emergency department.  °If you develop a fever greater that 101 F, purulent drainage from wound, increased redness or drainage from wound, foul odor from the wound/dressing, or calf pain - CONTACT YOUR SURGEON.   °                                                °FOLLOW-UP APPOINTMENTS °Make sure you keep all of your appointments after your operation with your surgeon and caregivers. You should call the office at the above phone number and make an appointment for approximately two weeks after the date of your surgery or on the date instructed by your surgeon outlined in the "After Visit Summary". ° °RANGE OF MOTION  AND STRENGTHENING EXERCISES  °These exercises are designed to help you keep full movement of your hip joint. Follow your caregiver's or physical therapist's instructions. Perform all exercises about fifteen times, three times per day or as directed. Exercise both hips, even if you have had only one joint replacement. These exercises can be done on a training (exercise) mat, on the floor, on a table or on a bed. Use whatever works the best and is most comfortable for you. Use music or television while you are exercising so that the exercises are a pleasant break in your day. This will make your life better with the exercises acting as a break in routine you can look forward to.  °Lying on your back, slowly slide your foot toward your buttocks, raising your knee up off the floor. Then slowly   slide your foot back down until your leg is straight again.  Lying on your back spread your legs as far apart as you can without causing discomfort.  Lying on your side, raise your upper leg and foot straight up from the floor as far as is comfortable. Slowly lower the leg and repeat.  Lying on your back, tighten up the muscle in the front of your thigh (quadriceps muscles). You can do this by keeping your leg straight and trying to raise your heel off the floor. This helps strengthen the largest muscle supporting your knee.  Lying on your back, tighten up the muscles of your buttocks both with the legs straight and with the knee bent at a comfortable angle while keeping your heel on the floor.   IF YOU ARE TRANSFERRED TO A SKILLED REHAB FACILITY If the patient is transferred to a skilled rehab facility following release from the hospital, a list of the current medications will be sent to the facility for the patient to continue.  When discharged from the skilled rehab facility, please have the facility set up the patient's [place] prior to being released. Also, the skilled facility will be responsible  for providing the patient with their medications at time of release from the facility to include their pain medication, the muscle relaxants, and their blood thinner medication. If the patient is still at the rehab facility at time of the two week follow up appointment, the skilled rehab facility will also need to assist the patient in arranging follow up appointment in our office and any transportation needs.  MAKE SURE YOU:  Understand these instructions.  Get help right away if you are not doing well or get worse.    Pick up stool softner and laxative for home use following surgery while on pain medications. Continue to use ice for pain and swelling after surgery. Do not use any lotions or creams on the incision until instructed by your surgeon.

## 2018-02-11 NOTE — Progress Notes (Signed)
Physical Therapy Treatment Patient Details Name: [name] [name] MRN: [id number] DOB: [birth year] Today's Date: 02/11/2018    History of Present Illness Pt is a 67 y.o female s/p L THR via anterior approach. PMH includes HTN, anemia, arthritis, C4-5/5-6 fusion, L TKA with 2 revisions/manipulations, and R THAPt is a 67 y.o female s/p L THR via anterior approach. PMH includes HTN, anemia, arthritis, C4-5/5-6 fusion, L TKA with 2 revisions/manipulations, and R THA    PT Comments    Pt with significantly improved function this morning as compared to yesterday. Pt able to tolerate supine AROM exercises with limited changes in pain. Overall pain around 4/10 t/o session greatly improved from yesterday. Pt tolerating bed mobility and supine to sit however significant effort required and min assist to fully complete tasks. Pt ambulating 40 feet with good effort however step to gait and decreased gait speed noted. Pt with overall improved mobility and is now most appropriate to return home with HHPT upon d/c.   Follow Up Recommendations  Home health PT     Equipment Recommendations       Recommendations for Other Services       Precautions / Restrictions Precautions Precautions: Anterior Hip Precaution Booklet Issued: Yes (comment) Restrictions Weight Bearing Restrictions: Yes LLE Weight Bearing: Weight bearing as tolerated    Mobility  Bed Mobility Overal bed mobility: Needs Assistance Bed Mobility: Supine to Sit     Supine to sit: Mod assist     General bed mobility comments: Pt with good effort however requiring significant UE assist on trapeze as well as min assist to bring L LE off bed and min assist for truncal elevation.   Transfers Overall transfer level: Needs assistance Equipment used: Rolling walker (2 wheeled) Transfers: Sit to/from Stand Sit to Stand: Min assist         General transfer comment: Good effort noted during sit to stand limited no VC needed for LE  or UE positioning, increased time required and min assist to bring hips off bed.   Ambulation/Gait Ambulation/Gait assistance: Min guard Gait Distance (Feet): 40 Feet Assistive device: Rolling walker (2 wheeled)       General Gait Details: Pt with great effort eager to begin ambulating, step to gait pattern, minimal change in pain, heavy UE reliance on RW, minimal fatigue following 40 feet    Stairs             Wheelchair Mobility    Modified Rankin (Stroke Patients Only)       Balance Overall balance assessment: Needs assistance Sitting-balance support: Feet supported;No upper extremity supported Sitting balance-Leahy Scale: Good     Standing balance support: No upper extremity supported Standing balance-Leahy Scale: Fair                              Cognition Arousal/Alertness: Awake/alert Behavior During Therapy: WFL for tasks assessed/performed Overall Cognitive Status: Within Functional Limits for tasks assessed                                        Exercises Total Joint Exercises Ankle Circles/Pumps: AROM;Both;10 reps;Supine Quad Sets: AROM;Both;10 reps;Supine Gluteal Sets: AROM;Both;10 reps;Supine Short Arc Quad: AROM;Left;10 reps;Supine Heel Slides: AROM;Left;10 reps;Supine Hip ABduction/ADduction: AROM;Left;10 reps;Supine    General Comments        Pertinent Vitals/Pain Pain Assessment: 0-10 Pain Score:  4  Pain Location: L hip Pain Descriptors / Indicators: Aching Pain Intervention(s): Limited activity within patient's tolerance;Monitored during session;Repositioned    Home Living Family/patient expects to be discharged to:: Private residence Living Arrangements: Alone Available Help at Discharge: Family;Friend(s) Type of Home: House Home Access: Ramped entrance   Home Layout: One level Home Equipment: Environmental consultant - 2 wheels;Cane - single point;Toilet riser;Grab bars - tub/shower      Prior Function Level of  Independence: Independent      Comments: Independent with ADLs, IADLs, performed yardwork, and worked as a  Biomedical engineer for several clients.   PT Goals (current goals can now be found in the care plan section) Acute Rehab PT Goals Patient Stated Goal: To regain independence Progress towards PT goals: Progressing toward goals    Frequency    BID      PT Plan Discharge plan needs to be updated    Co-evaluation              AM-PAC PT "6 Clicks" Daily Activity  Outcome Measure  Difficulty turning over in bed (including adjusting bedclothes, sheets and blankets)?: Unable Difficulty moving from lying on back to sitting on the side of the bed? : Unable Difficulty sitting down on and standing up from a chair with arms (e.g., wheelchair, bedside commode, etc,.)?: Unable Help needed moving to and from a bed to chair (including a wheelchair)?: A Little Help needed walking in hospital room?: A Little Help needed climbing 3-5 steps with a railing? : A Lot 6 Click Score: 11    End of Session Equipment Utilized During Treatment: Gait belt Activity Tolerance: Patient limited by pain Patient left: with bed alarm set;with SCD's reapplied;in chair;with call bell/phone within reach;with chair alarm set Nurse Communication: Mobility status PT Visit Diagnosis: Other abnormalities of gait and mobility (R26.89);Muscle weakness (generalized) (M62.81);Pain;Difficulty in walking, not elsewhere classified (R26.2) Pain - Right/Left: Left Pain - part of body: Hip     Time: 0045-9977 PT Time Calculation (min) (ACUTE ONLY): 33 min  Charges:                        Ernie Avena, SPT 02/11/2018, 12:49 PM

## 2018-02-11 NOTE — Anesthesia Postprocedure Evaluation (Signed)
Anesthesia Post Note  Patient: [name] [name]  Procedure(s) Performed: TOTAL HIP ARTHROPLASTY ANTERIOR APPROACH (Left )  Patient location during evaluation: Nursing Unit Anesthesia Type: Spinal Level of consciousness: oriented and awake and alert Pain management: pain level controlled Vital Signs Assessment: post-procedure vital signs reviewed and stable Respiratory status: spontaneous breathing Cardiovascular status: blood pressure returned to baseline and stable Postop Assessment: no headache, no backache, no apparent nausea or vomiting and patient able to bend at knees Anesthetic complications: no     Last Vitals:  Vitals:   02/11/18 0019 02/11/18 0410  BP: 135/75 132/68  Pulse: 74 80  Resp: 17 19  Temp: 37.1 C 37.1 C  SpO2: 96% 99%    Last Pain:  Vitals:   02/11/18 0410  TempSrc: Oral  PainSc:                  Precious Haws Geordie Nooney

## 2018-02-11 NOTE — Progress Notes (Signed)
Physical Therapy Treatment Patient Details Name: [name] [name] MRN: [id number] DOB: February 26, 1951 Today's Date: 02/11/2018    History of Present Illness Pt is a 67 y.o female s/p L THR via anterior approach. PMH includes HTN, anemia, arthritis, C4-5/5-6 fusion, L TKA with 2 revisions/manipulations, and R THAPt is a 67 y.o female s/p L THR via anterior approach. PMH includes HTN, anemia, arthritis, C4-5/5-6 fusion, L TKA with 2 revisions/manipulations, and R THA    PT Comments    Pt with decreased pain this afternoon but increased fatigue. All exercises performed actively with no assist required pt continues to progress in strength. Pt tolerating progression of ambulation well with improved step through pattern but significant reliance on UE and visible fatigue following 75 feet. Pt with no complaints or adverse symptoms following ambulation. With significant effort but determination to perform independently, pt perform sit to supine with L LE hooked under R to swing LE onto bed with no physical assist needed from SPT. Overall pt progressing well and remains most appropriate for return home with HHPT upon d/c.     Follow Up Recommendations  Home health PT     Equipment Recommendations       Recommendations for Other Services       Precautions / Restrictions Precautions Precautions: Anterior Hip Precaution Booklet Issued: Yes (comment) Restrictions Weight Bearing Restrictions: Yes LLE Weight Bearing: Weight bearing as tolerated    Mobility  Bed Mobility Overal bed mobility: Needs Assistance Bed Mobility: Supine to Sit     Sit to supine: Supervision   General bed mobility comments: Pt able to utilize assist from R LE hooked under L LE to bring BLE onto bed. No physical assist requried but significant effort and time for pt to perfrom independently.   Transfers Overall transfer level: Needs assistance Equipment used: Rolling walker (2 wheeled) Transfers: Sit to/from  Stand Sit to Stand: Supervision         General transfer comment: No physical assist required pt going from sit to stand with improved speed and confidence this afternoon.   Ambulation/Gait Ambulation/Gait assistance: Min guard Gait Distance (Feet): 75 Feet Assistive device: Rolling walker (2 wheeled)       General Gait Details: Pt able to progress ambulation distance with improved step through gait but not equal step length B, heavy reliance on RW continues to be utilized, pt fatigued following 75 feet but no complaints   Stairs             Wheelchair Mobility    Modified Rankin (Stroke Patients Only)       Balance Overall balance assessment: Needs assistance Sitting-balance support: Feet supported;No upper extremity supported Sitting balance-Leahy Scale: Good     Standing balance support: No upper extremity supported Standing balance-Leahy Scale: Fair                              Cognition Arousal/Alertness: Awake/alert Behavior During Therapy: WFL for tasks assessed/performed Overall Cognitive Status: Within Functional Limits for tasks assessed                                        Exercises Total Joint Exercises Towel Squeeze: AROM;Right;10 reps;Supine Short Arc Quad: AROM;Left;10 reps;Supine Heel Slides: AROM;Left;10 reps;Supine Hip ABduction/ADduction: AROM;Left;10 reps;Supine    General Comments        Pertinent Vitals/Pain Pain  Assessment: 0-10 Pain Score: 2  Pain Location: L hip Pain Descriptors / Indicators: Aching Pain Intervention(s): Limited activity within patient's tolerance;Monitored during session;Repositioned;Ice applied    Home Living Family/patient expects to be discharged to:: Private residence Living Arrangements: Alone Available Help at Discharge: Family;Friend(s) Type of Home: House Home Access: Ramped entrance   Home Layout: One level Home Equipment: Environmental consultant - 2 wheels;Cane - single  point;Toilet riser;Grab bars - tub/shower      Prior Function Level of Independence: Independent      Comments: Independent with ADLs, IADLs, performed yardwork, and worked as a  Biomedical engineer for several clients.   PT Goals (current goals can now be found in the care plan section) Acute Rehab PT Goals Patient Stated Goal: To regain independence Progress towards PT goals: Progressing toward goals    Frequency    BID      PT Plan Current plan remains appropriate    Co-evaluation              AM-PAC PT "6 Clicks" Daily Activity  Outcome Measure  Difficulty turning over in bed (including adjusting bedclothes, sheets and blankets)?: A Little Difficulty moving from lying on back to sitting on the side of the bed? : Unable Difficulty sitting down on and standing up from a chair with arms (e.g., wheelchair, bedside commode, etc,.)?: A Lot Help needed moving to and from a bed to chair (including a wheelchair)?: A Little Help needed walking in hospital room?: A Little Help needed climbing 3-5 steps with a railing? : A Lot 6 Click Score: 14    End of Session Equipment Utilized During Treatment: Gait belt Activity Tolerance: Patient tolerated treatment well Patient left: with SCD's reapplied;with call bell/phone within reach;in bed;with bed alarm set Nurse Communication: Mobility status PT Visit Diagnosis: Other abnormalities of gait and mobility (R26.89);Muscle weakness (generalized) (M62.81);Pain;Difficulty in walking, not elsewhere classified (R26.2) Pain - Right/Left: Left Pain - part of body: Hip     Time: 1403-1430 PT Time Calculation (min) (ACUTE ONLY): 27 min  Charges:                     Ernie Avena, SPT 02/11/2018, 2:42 PM

## 2018-02-11 NOTE — Progress Notes (Signed)
   Subjective: 1 Day Post-Op Procedure(s) (LRB): TOTAL HIP ARTHROPLASTY ANTERIOR APPROACH (Left) Patient reports pain as 4 on 0-10 scale.   Patient is well, and has had no acute complaints or problems Denies any CP, SOB, ABD pain. We will continue therapy today.  Plan is to go Home after hospital stay.  Objective: Vital signs in last 24 hours: Temp:  [97.2 F (36.2 C)-98.8 F (37.1 C)] 98.8 F (37.1 C) (10/11 0410) Pulse Rate:  [48-80] 80 (10/11 0410) Resp:  [12-24] 19 (10/11 0410) BP: (85-145)/(25-90) 132/68 (10/11 0410) SpO2:  [96 %-100 %] 99 % (10/11 0410) Weight:  [104.3 kg-104.5 kg] 104.5 kg (10/10 1347)  Intake/Output from previous day: 10/10 0701 - 10/11 0700 In: 947.8 [P.O.:120; I.V.:803.4; IV Piggyback:24.4] Out: 7078 [Urine:2205; Emesis/NG output:700; Drains:70; Blood:100] Intake/Output this shift: No intake/output data recorded.  Recent Labs    02/11/18 0450  HGB 12.6   Recent Labs    02/11/18 0450  WBC 11.0*  RBC 4.47  HCT 39.5  PLT 286   Recent Labs    02/11/18 0450  NA 138  K 3.6  CL 101  CO2 28  BUN 9  CREATININE 0.75  GLUCOSE 152*  CALCIUM 8.4*   No results for input(s): LABPT, INR in the last 72 hours.  EXAM General - Patient is Alert, Appropriate and Oriented Extremity - Neurovascular intact Sensation intact distally Intact pulses distally Dorsiflexion/Plantar flexion intact No cellulitis present Compartment soft Dressing - dressing C/D/I and no drainage, hemovac and wound vac intact Motor Function - intact, moving foot and toes well on exam.   Past Medical History:  Diagnosis Date  . Arthritis   . Complication of anesthesia    terrible vertigo  . Hypertension   . PONV (postoperative nausea and vomiting)    related to vertigo  . Shortness of breath    WITH EXERTION  . Vertigo    hx post op    Assessment/Plan:   1 Day Post-Op Procedure(s) (LRB): TOTAL HIP ARTHROPLASTY ANTERIOR APPROACH (Left) Active Problems:  Status post total hip replacement, left  Estimated body mass index is 33.06 kg/m as calculated from the following:   Height as of this encounter: 5\' 10"  (1.778 m).   Weight as of this encounter: 104.5 kg. Advance diet Up with therapy  Needs BM Vital signs are stable Labs stable CM to assist with discharge to home with HHPT.  DVT Prophylaxis - Aspirin, TED hose and SCDs Weight-Bearing as tolerated to left leg   T. Rachelle Hora, PA-C [place] 02/11/2018, 7:58 AM

## 2018-02-11 NOTE — Evaluation (Signed)
Occupational Therapy Evaluation Patient Details Name: [name] [name] MRN: [id number] DOB: 05/05/1950 Today's Date: 02/11/2018    History of Present Illness Pt is a 67 y.o female who was admitted to [facility] for a L THR via anterior approach. PMH includes: HTN, anemia, arthritis, C4-5/5-6 fusion, L TKA with 2 revisions/manipulations, and R THA   Clinical Impression   Pt. presents with weakness, limited ROM, left hip pain, and limited activity tolerance, and limited functional mobility which limits her ability to complete basic ADL and IADL functioning. Pt. resides at home alone, with sisters residing across the street. Pt. Was independent with ADLs, and IADL functioning: including meal preparation, and medication management. Pt. was able to drive, perform yardwork, and was working as a Biomedical engineer for 2 clients. Pt. education was provided about A/E use for LE ADLs. Pt. has A/E available at home following several previous orthopedic surgeries. Pt. Reports being aware of how to use them, and was actively using them after her previous surgeries. Pt. Could benefit from OT services for ADL training, A/E training, and pt. Education about home modification, and DME. Pt. Would like to return home upon discharge. Pt. Could benefit from follow-up OT serivces    Follow Up Recommendations  SNF    Equipment Recommendations       Recommendations for Other Services       Precautions / Restrictions Restrictions Weight Bearing Restrictions: Yes LLE Weight Bearing: Weight bearing as tolerated      Mobility                   Transfers      Mobility deferred to PT.                Balance                                           ADL either performed or assessed with clinical judgement   ADL Overall ADL's : Needs assistance/impaired Eating/Feeding: Independent;Set up   Grooming: Set up;Independent;Sitting   Upper Body Bathing: Set up;Independent   Lower  Body Bathing: Set up;Maximal assistance   Upper Body Dressing : Set up;Independent   Lower Body Dressing: Maximal assistance;Set up                 General ADL Comments: Pt. education was provded about A/E use for LE ADLs.     Vision         Perception     Praxis      Pertinent Vitals/Pain Pain Assessment: 0-10 Pain Score: 4  Pain Descriptors / Indicators: Aching Pain Intervention(s): Limited activity within patient's tolerance;Monitored during session;Premedicated before session     Hand Dominance Right   Extremity/Trunk Assessment Upper Extremity Assessment Upper Extremity Assessment: Overall WFL for tasks assessed           Communication Communication Communication: No difficulties   Cognition Arousal/Alertness: Awake/alert Behavior During Therapy: WFL for tasks assessed/performed Overall Cognitive Status: Within Functional Limits for tasks assessed                                     General Comments       Exercises     Shoulder Instructions      Home Living Family/patient expects to be discharged to:: Private residence Living  Arrangements: Alone Available Help at Discharge: Family;Friend(s) Type of Home: House Home Access: Ramped entrance     Home Layout: One level     Bathroom Shower/Tub: Tub/shower unit;Curtain   Biochemist, clinical: Standard     Home Equipment: Environmental consultant - 2 wheels;Cane - single point;Toilet riser;Grab bars - tub/shower          Prior Functioning/Environment Level of Independence: Independent        Comments: Independent with ADLs, IADLs, performed yardwork, and worked as a  Biomedical engineer for several clients.        OT Problem List: Decreased strength;Decreased activity tolerance;Decreased knowledge of use of DME or AE;Pain;Decreased range of motion      OT Treatment/Interventions: Self-care/ADL training;Therapeutic exercise;Patient/family education;Therapeutic activities;DME and/or AE  instruction    OT Goals(Current goals can be found in the care plan section) Acute Rehab OT Goals Patient Stated Goal: To regain independence Time For Goal Achievement: 02/11/18  OT Frequency: Min 2X/week   Barriers to D/C:            Co-evaluation              AM-PAC PT "6 Clicks" Daily Activity     Outcome Measure Help from another person eating meals?: None Help from another person taking care of personal grooming?: None Help from another person toileting, which includes using toliet, bedpan, or urinal?: A Little Help from another person bathing (including washing, rinsing, drying)?: A Lot Help from another person to put on and taking off regular upper body clothing?: None Help from another person to put on and taking off regular lower body clothing?: A Lot 6 Click Score: 19   End of Session    Activity Tolerance: Patient tolerated treatment well Patient left: in bed  OT Visit Diagnosis: Muscle weakness (generalized) (M62.81);Pain Pain - Right/Left: Left Pain - part of body: Hip                Time: 3338-3291 OT Time Calculation (min): 19 min Charges:  OT General Charges $OT Visit: 1 Visit OT Evaluation $OT Eval Low Complexity: 1 Low   Harrel Carina, MS, OTR/L  Harrel Carina 02/11/2018, 12:17 PM

## 2018-02-12 MED ORDER — BISACODYL 10 MG RE SUPP
10.0000 mg | Freq: Once | RECTAL | Status: AC
  Administered 2018-02-12: 10 mg via RECTAL
  Filled 2018-02-12: qty 1

## 2018-02-12 NOTE — Progress Notes (Signed)
Physical Therapy Treatment Patient Details Name: [name] [name] MRN: [id number] DOB: 09/26/1950 Today's Date: 02/12/2018    History of Present Illness Pt is a 67 y.o female s/p L THR via anterior approach. PMH includes HTN, anemia, arthritis, C4-5/5-6 fusion, L TKA with 2 revisions/manipulations, and R THAPt is a 67 y.o female s/p L THR via anterior approach. PMH includes HTN, anemia, arthritis, C4-5/5-6 fusion, L TKA with 2 revisions/manipulations, and R THA    PT Comments    Pt in bed, reports general abdominal discomfort.  Holding stomach and rolling left and right in bed.  Pt agreeable to trying to use bathroom.  To edge of bed with mod a x 1.  Stood with min a x 1 and was able to walk to bathroom but unable to have BM.  She did void.  While sitting she began gagging and did vomit a small amount which she said was medication she received to have BM.  Nursing in and Zofran given for nausea.  She was able to ambulate to desk and back but limited by nausea and general abdominal discomfort.  Plan is for home today after BM.  She did have some increased difficulty with mobility today but she attributes it to the above.  She has a ramp at home and does not have to go up/down steps.  She stated she has help at home and was encouraged to have +1 assist with mobility for general safety.  Voiced understanding.  Will schedule PM session in case pt does not discharge home today.   Follow Up Recommendations  Home health PT     Equipment Recommendations       Recommendations for Other Services       Precautions / Restrictions Precautions Precautions: Anterior Hip Precaution Booklet Issued: Yes (comment) Restrictions Weight Bearing Restrictions: Yes LLE Weight Bearing: Weight bearing as tolerated    Mobility  Bed Mobility Overal bed mobility: Independent Bed Mobility: Supine to Sit     Supine to sit: Min assist        Transfers Overall transfer level: Needs assistance Equipment  used: Rolling walker (2 wheeled) Transfers: Sit to/from Stand Sit to Stand: Min assist            Ambulation/Gait Ambulation/Gait assistance: Min guard;Min assist Gait Distance (Feet): 100 Feet Assistive device: Rolling walker (2 wheeled) Gait Pattern/deviations: Step-to pattern;Trunk flexed Gait velocity: decreased   General Gait Details: relianne on walker for support and balance, flexed posture with verbal cues to stand upright.  abdominal pain and nausea limiting today.   Stairs             Wheelchair Mobility    Modified Rankin (Stroke Patients Only)       Balance Overall balance assessment: Needs assistance Sitting-balance support: Feet supported;No upper extremity supported Sitting balance-Leahy Scale: Good     Standing balance support: Bilateral upper extremity supported Standing balance-Leahy Scale: Fair                              Cognition Arousal/Alertness: Awake/alert Behavior During Therapy: WFL for tasks assessed/performed Overall Cognitive Status: Within Functional Limits for tasks assessed                                        Exercises Other Exercises Other Exercises: to bathroom to void, no BM noted  General Comments        Pertinent Vitals/Pain Pain Assessment: 0-10 Pain Score: 2  Pain Location: L hip Pain Descriptors / Indicators: Aching Pain Intervention(s): Limited activity within patient's tolerance;Monitored during session;Ice applied    Home Living                      Prior Function            PT Goals (current goals can now be found in the care plan section) Progress towards PT goals: Progressing toward goals    Frequency    BID      PT Plan Current plan remains appropriate    Co-evaluation              AM-PAC PT "6 Clicks" Daily Activity  Outcome Measure  Difficulty turning over in bed (including adjusting bedclothes, sheets and blankets)?: A  Little Difficulty moving from lying on back to sitting on the side of the bed? : Unable Difficulty sitting down on and standing up from a chair with arms (e.g., wheelchair, bedside commode, etc,.)?: Unable Help needed moving to and from a bed to chair (including a wheelchair)?: A Little Help needed walking in hospital room?: A Little Help needed climbing 3-5 steps with a railing? : A Lot 6 Click Score: 13    End of Session Equipment Utilized During Treatment: Gait belt Activity Tolerance: Patient tolerated treatment well;Other (comment) Patient left: in chair;with call bell/phone within reach;with chair alarm set Nurse Communication: Other (comment) Pain - Right/Left: Left Pain - part of body: Hip     Time: 5883-2549 PT Time Calculation (min) (ACUTE ONLY): 30 min  Charges:  $Gait Training: 8-22 mins $Therapeutic Activity: 8-22 mins                     Chesley Noon, PTA 02/12/18, 12:03 PM

## 2018-02-12 NOTE — Progress Notes (Signed)
   Subjective: 2 Days Post-Op Procedure(s) (LRB): TOTAL HIP ARTHROPLASTY ANTERIOR APPROACH (Left) Patient reports pain as 4 on 0-10 scale.   Patient is well, and has had no acute complaints or problems Denies any CP, SOB, ABD pain. We will continue therapy today.  Plan is to go Home after hospital stay.  Objective: Vital signs in last 24 hours: Temp:  [97.8 F (36.6 C)-98.8 F (37.1 C)] 98.8 F (37.1 C) (10/12 0010) Pulse Rate:  [59-74] 60 (10/12 0010) Resp:  [16-18] 16 (10/12 0010) BP: (106-114)/(53-79) 106/61 (10/12 0010) SpO2:  [92 %-96 %] 92 % (10/12 0010)  Intake/Output from previous day: 10/11 0701 - 10/12 0700 In: 720 [P.O.:720] Out: 540 [Urine:500; Drains:40] Intake/Output this shift: No intake/output data recorded.  Recent Labs    02/11/18 0450  HGB 12.6   Recent Labs    02/11/18 0450  WBC 11.0*  RBC 4.47  HCT 39.5  PLT 286   Recent Labs    02/11/18 0450  NA 138  K 3.6  CL 101  CO2 28  BUN 9  CREATININE 0.75  GLUCOSE 152*  CALCIUM 8.4*   No results for input(s): LABPT, INR in the last 72 hours.  EXAM General - Patient is Alert, Appropriate and Oriented Extremity - Neurovascular intact Sensation intact distally Intact pulses distally Dorsiflexion/Plantar flexion intact No cellulitis present Compartment soft Dressing - dressing C/D/I and no drainage, hemovac and wound vac intact.  Hemovac removed.  Wound VAC without drainage. Motor Function - intact, moving foot and toes well on exam.   Past Medical History:  Diagnosis Date  . Arthritis   . Complication of anesthesia    terrible vertigo  . Hypertension   . PONV (postoperative nausea and vomiting)    related to vertigo  . Shortness of breath    WITH EXERTION  . Vertigo    hx post op    Assessment/Plan:   2 Days Post-Op Procedure(s) (LRB): TOTAL HIP ARTHROPLASTY ANTERIOR APPROACH (Left) Active Problems:   Status post total hip replacement, left  Estimated body mass index is  33.06 kg/m as calculated from the following:   Height as of this encounter: 5\' 10"  (1.778 m).   Weight as of this encounter: 104.5 kg. Advance diet Up with therapy  Needs BM Vital signs are stable CM to assist with discharge to home with HHPT. Possible discharge to home with home health PT today if patient has bowel movement and completes physical therapy goals.  DVT Prophylaxis - Aspirin, TED hose and SCDs Weight-Bearing as tolerated to left leg   T. Rachelle Hora, PA-C [place] 02/12/2018, 7:21 AM

## 2018-02-13 LAB — BASIC METABOLIC PANEL
Anion gap: 11 (ref 5–15)
BUN: 12 mg/dL (ref 8–23)
CALCIUM: 8.8 mg/dL — AB (ref 8.9–10.3)
CO2: 31 mmol/L (ref 22–32)
Chloride: 94 mmol/L — ABNORMAL LOW (ref 98–111)
Creatinine, Ser: 0.61 mg/dL (ref 0.44–1.00)
GFR calc non Af Amer: >60 mL/min (ref >60)
Glucose, Bld: 97 mg/dL (ref 70–99)
POTASSIUM: 3.8 mmol/L (ref 3.5–5.1)
SODIUM: 136 mmol/L (ref 135–145)

## 2018-02-13 LAB — URINALYSIS, COMPLETE (UACMP) WITH MICROSCOPIC
BILIRUBIN URINE: NEGATIVE
Bacteria, UA: NONE SEEN
GLUCOSE, UA: NEGATIVE mg/dL
HGB URINE DIPSTICK: NEGATIVE
KETONES UR: 5 mg/dL — AB
LEUKOCYTES UA: NEGATIVE
NITRITE: NEGATIVE
PH: 6 (ref 5.0–8.0)
Protein, ur: NEGATIVE mg/dL
SPECIFIC GRAVITY, URINE: 1.023 (ref 1.005–1.030)

## 2018-02-13 LAB — CBC
HCT: 38.8 % (ref 36.0–46.0)
Hemoglobin: 12.5 g/dL (ref 12.0–15.0)
MCH: 28.5 pg (ref 26.0–34.0)
MCHC: 32.2 g/dL (ref 30.0–36.0)
MCV: 88.4 fL (ref 80.0–100.0)
NRBC: 0 % (ref 0.0–0.2)
PLATELETS: 287 10*3/uL (ref 150–400)
RBC: 4.39 MIL/uL (ref 3.87–5.11)
RDW: 13.6 % (ref 11.5–15.5)
WBC: 10.5 10*3/uL (ref 4.0–10.5)

## 2018-02-13 NOTE — Progress Notes (Signed)
   Subjective: 3 Days Post-Op Procedure(s) (LRB): TOTAL HIP ARTHROPLASTY ANTERIOR APPROACH (Left) Patient reports pain as 0/10 left hip. Having pain in RLQ of abdomen. Pain began last night/this am. Pain is aching. No nausea or vomiting. Normal BM yesterday. No fevers.   Denies any CP, SOB. We will continue therapy today.  Plan is to go Home after hospital stay.  Objective: Vital signs in last 24 hours: Temp:  [97.4 F (36.3 C)-98.6 F (37 C)] 97.9 F (36.6 C) (10/13 0732) Pulse Rate:  [52-81] 63 (10/13 0732) Resp:  [18] 18 (10/13 0354) BP: (99-152)/(57-88) 132/88 (10/13 0732) SpO2:  [94 %-100 %] 94 % (10/13 0732)  Intake/Output from previous day: 10/12 0701 - 10/13 0700 In: 480 [P.O.:480] Out: -  Intake/Output this shift: No intake/output data recorded.  Recent Labs    02/11/18 0450  HGB 12.6   Recent Labs    02/11/18 0450  WBC 11.0*  RBC 4.47  HCT 39.5  PLT 286   Recent Labs    02/11/18 0450  NA 138  K 3.6  CL 101  CO2 28  BUN 9  CREATININE 0.75  GLUCOSE 152*  CALCIUM 8.4*   No results for input(s): LABPT, INR in the last 72 hours.  EXAM General - Patient is Alert, Appropriate and Oriented  Abdomen - soft, non-distended, Normal BS. Mild tenderness RLQ Extremity - Neurovascular intact Sensation intact distally Intact pulses distally Dorsiflexion/Plantar flexion intact No cellulitis present Compartment soft Dressing - dressing C/D/I and no drainage, wound vac intact.   Wound VAC without drainage. Motor Function - intact, moving foot and toes well on exam.   Past Medical History:  Diagnosis Date  . Arthritis   . Complication of anesthesia    terrible vertigo  . Hypertension   . PONV (postoperative nausea and vomiting)    related to vertigo  . Shortness of breath    WITH EXERTION  . Vertigo    hx post op    Assessment/Plan:   3 Days Post-Op Procedure(s) (LRB): TOTAL HIP ARTHROPLASTY ANTERIOR APPROACH (Left) Active Problems:   Status  post total hip replacement, left  Estimated body mass index is 33.06 kg/m as calculated from the following:   Height as of this encounter: 5\' 10"  (1.778 m).   Weight as of this encounter: 104.5 kg. Advance diet Up with therapy  Vital signs are stable RLQ abd pain - VSS, will check labs and UA this am. Continue to monitor CM to assist with discharge to home with HHPT. Possible discharge to home with home health PT today DVT Prophylaxis - Aspirin, TED hose and SCDs Weight-Bearing as tolerated to left leg   T. Rachelle Hora, PA-C [place] 02/13/2018, 8:11 AM

## 2018-02-13 NOTE — Progress Notes (Signed)
Physical Therapy Treatment Patient Details Name: [name] [name] MRN: [id number] DOB: 07-23-1950 Today's Date: 02/13/2018    History of Present Illness Pt is a 67 y.o female s/p L THR via anterior approach. PMH includes HTN, anemia, arthritis, C4-5/5-6 fusion, L TKA with 2 revisions/manipulations, and R THAPt is a 67 y.o female s/p L THR via anterior approach. PMH includes HTN, anemia, arthritis, C4-5/5-6 fusion, L TKA with 2 revisions/manipulations, and R THA    PT Comments    Patient demonstrated progress in ambulatory distance and participation in there ex today. She was able to complete 170 ft of ambulation with RW, CGA assist and 2 rest breaks due to discomfort in hands.  Pt maintains a slow step to gait pattern but was able to further advance L LE in swing phase as ambulation progressed.  She reported 3/10 pain in L hip and was able to complete all there ex with min to no assistance.  Pt very willing to work with PT and confirmed understanding of all education including car transfers and stair negotiation.  PT moved RW height down one notch to better assist pt in upright posture and proper arm placement and this helped to correct pt's posture.  She reported no nausea throughout treatment.  Pt will continue to benefit from skilled PT to address pain management, tolerance to activity, safe ambulation with RW and  HEP.  Home health PT remains an appropriate discharge plan.  Follow Up Recommendations  Home health PT     Equipment Recommendations  None recommended by PT    Recommendations for Other Services       Precautions / Restrictions Precautions Precautions: Anterior Hip Restrictions Weight Bearing Restrictions: Yes LLE Weight Bearing: Weight bearing as tolerated    Mobility  Bed Mobility Overal bed mobility: Modified Independent Bed Mobility: Supine to Sit;Sit to Supine     Supine to sit: Modified independent (Device/Increase time) Sit to supine: Modified independent  (Device/Increase time)   General bed mobility comments: Demonstrated ability to use R LE hooked under L LE to move over EOB.  Transfers Overall transfer level: Needs assistance Equipment used: Rolling walker (2 wheeled) Transfers: Sit to/from Stand Sit to Stand: Supervision         General transfer comment: Pt able to rise from bed at standard height.  She took increased time to obtain upright posture but was able to use RW to stand upright.  Ambulation/Gait Ambulation/Gait assistance: Min guard Gait Distance (Feet): 170 Feet Assistive device: Rolling walker (2 wheeled) Gait Pattern/deviations: Step-to pattern;Trunk flexed Gait velocity: decreased Gait velocity interpretation: <1.8 ft/sec, indicate of risk for recurrent falls General Gait Details: able to complete 170 ft with 2 rest breaks, showing signs of fatigue near the end and reporting discomfort in hands.  Pt able to increase L LE advancement as ambulation progressed.  PT assisted with management of hemavac.   Stairs             Wheelchair Mobility    Modified Rankin (Stroke Patients Only)       Balance Overall balance assessment: Needs assistance Sitting-balance support: Feet supported;No upper extremity supported Sitting balance-Leahy Scale: Good     Standing balance support: Bilateral upper extremity supported Standing balance-Leahy Scale: Fair                              Cognition Arousal/Alertness: Awake/alert Behavior During Therapy: WFL for tasks assessed/performed Overall Cognitive Status: Within Functional  Limits for tasks assessed                                 General Comments: Pleasant and ready to work with PT.      Exercises Total Joint Exercises Quad Sets: 10 reps;Supine;Left;Strengthening Hip ABduction/ADduction: AAROM;10 reps;Supine;Left Long Arc Quad: Strengthening;Left;10 reps;Seated Other Exercises Other Exercises: Standing hip abduction x10 with  use of RW for support, CGA. Other Exercises: Review of car transfers and stair negotiation.  Pt is confident with these topics as a result of prior surgeries.    General Comments        Pertinent Vitals/Pain Pain Assessment: 0-10 Pain Score: 3  Pain Location: L hip Pain Descriptors / Indicators: Aching Pain Intervention(s): Limited activity within patient's tolerance;Monitored during session    Home Living                      Prior Function            PT Goals (current goals can now be found in the care plan section) Acute Rehab PT Goals Patient Stated Goal: To regain independence PT Goal Formulation: With patient Time For Goal Achievement: 02/27/18 Potential to Achieve Goals: Good Progress towards PT goals: Progressing toward goals    Frequency    BID      PT Plan Current plan remains appropriate    Co-evaluation              AM-PAC PT "6 Clicks" Daily Activity  Outcome Measure  Difficulty turning over in bed (including adjusting bedclothes, sheets and blankets)?: A Little Difficulty moving from lying on back to sitting on the side of the bed? : A Little Difficulty sitting down on and standing up from a chair with arms (e.g., wheelchair, bedside commode, etc,.)?: A Little Help needed moving to and from a bed to chair (including a wheelchair)?: A Little Help needed walking in hospital room?: A Little Help needed climbing 3-5 steps with a railing? : A Little 6 Click Score: 18    End of Session Equipment Utilized During Treatment: Gait belt Activity Tolerance: Patient tolerated treatment well Patient left: in bed;with bed alarm set;with call bell/phone within reach;with family/visitor present Nurse Communication: Mobility status PT Visit Diagnosis: Unsteadiness on feet (R26.81);Muscle weakness (generalized) (M62.81);Pain Pain - Right/Left: Left Pain - part of body: Hip     Time: 1424-1450 PT Time Calculation (min) (ACUTE ONLY): 26  min  Charges:  $Therapeutic Exercise: 23-37 mins                     Roxanne Gates, PT, DPT    Roxanne Gates 02/13/2018, 2:57 PM

## 2018-02-13 NOTE — Progress Notes (Signed)
Patient discharging home. IV removed. Patient given discharge instructions, verbalized understanding. Sister providing transporting patient home.

## 2018-02-13 NOTE — Care Management Note (Signed)
Case Management Note  Patient Details  Name: [name] [name] MRN: [id number] Date of Birth: [birth year]  Subjective/Objective:   Patient to be discharged per MD order. Orders in place for home health services. Previous RNCM had began workup with home health via [facility] care. Patient prefers this as the plan for transition of care. Brad from [facility] accepted the referral for PT services. Patient has all necessary DME. No further needs. Family to provide transport. Ines Bloomer RN BSN RNCM [phone]                   Action/Plan:   Expected Discharge Date:  02/13/18               Expected Discharge Plan:  [place]  In-House Referral:     Discharge planning Services  CM Consult  Post Acute Care Choice:  Home Health Choice offered to:  Patient, Sibling  DME Arranged:    DME Agency:     HH Arranged:  PT [place]:  [place]  Status of Service:  Completed, signed off  If discussed at [place] of Stay Meetings, dates discussed:    Additional Comments:  Latanya Maudlin, RN 02/13/2018, 1:57 PM

## 2018-02-15 LAB — SURGICAL PATHOLOGY

## 2018-02-21 ENCOUNTER — Telehealth: Payer: Self-pay | Admitting: Licensed Clinical Social Worker

## 2018-02-21 NOTE — Telephone Encounter (Signed)
EMMI flagged patient for answering yes to feeling sad/hopeless/anxious/empty. Clinical Education officer, museum (CSW) contacted patient via her cell phone. Per patient she is doing good and has been working with home health PT. Patient reported that she is getting out and is at a town hall meeting right now. Patient reported that she is not feeling sad or depressed and scored 1 on PHQ-9. Patient reported no needs or concerns.   [employer], LCSW [phone]

## 2018-03-07 ENCOUNTER — Encounter: Payer: Self-pay | Admitting: Physical Therapy

## 2018-03-07 ENCOUNTER — Ambulatory Visit: Payer: Medicare HMO | Attending: Orthopedic Surgery | Admitting: Physical Therapy

## 2018-03-07 DIAGNOSIS — G8929 Other chronic pain: Secondary | ICD-10-CM | POA: Insufficient documentation

## 2018-03-07 DIAGNOSIS — R269 Unspecified abnormalities of gait and mobility: Secondary | ICD-10-CM | POA: Insufficient documentation

## 2018-03-07 DIAGNOSIS — M25662 Stiffness of left knee, not elsewhere classified: Secondary | ICD-10-CM | POA: Insufficient documentation

## 2018-03-07 DIAGNOSIS — M6281 Muscle weakness (generalized): Secondary | ICD-10-CM | POA: Diagnosis present

## 2018-03-07 DIAGNOSIS — M25562 Pain in left knee: Secondary | ICD-10-CM | POA: Diagnosis not present

## 2018-03-07 NOTE — Therapy (Signed)
[facility] [address] [place], [place], [address] Phone: [phone]   Fax:  [phone]  Physical Therapy Evaluation  Patient Details  Name: [name] [name] MRN: [id number] Date of Birth: 12/13/1950 Referring Provider (PT): Dr. Rozelle Logan   Encounter Date: 03/07/2018  PT End of Session - 03/07/18 0924    Visit Number  1    Number of Visits  8    Date for PT Re-Evaluation  04/04/18    PT Start Time  0905    PT Stop Time  1002    PT Time Calculation (min)  57 min    Activity Tolerance  Patient tolerated treatment well;Patient limited by pain    Behavior During Therapy  [facility] for tasks assessed/performed       Past Medical History:  Diagnosis Date  . Arthritis   . Complication of anesthesia    terrible vertigo  . Hypertension   . PONV (postoperative nausea and vomiting)    related to vertigo  . Shortness of breath    WITH EXERTION  . Vertigo    hx post op    Past Surgical History:  Procedure Laterality Date  . BACK SURGERY  2010   cervical fusion  . BREAST BIOPSY Left 03/31/2011   fibrocystic changes, marked stromal sclerosis, and dense stromal calcifications  . BREAST BIOPSY Left 03/31/2011  . BREAST BIOPSY Right 03/31/2011   fine needle aspiration with clip placement - ultrasound  . BREAST SURGERY Left 2005   biopsy  . CARPAL TUNNEL RELEASE Right   . FOOT SURGERY Left    heel---STATES METAL IN THE HEEL - FOOT TURNS OUTWARD  . HEEL SPUR SURGERY    . HYSTEROSCOPY W/D&C N/A 07/26/2015   Procedure: DILATATION AND CURETTAGE /HYSTEROSCOPY with cervical biopsy;  Surgeon: Benjaman Kindler, MD;  Location: [facility];  Service: Gynecology;  Laterality: N/A;  . JOINT REPLACEMENT Left 2013   knee  . KNEE ARTHROTOMY Left 05/29/2013   Procedure: LEFT KNEE ARTHROTOMY WITH SCAR EXCISION;  Surgeon: Gearlean Alf, MD;  Location: [facility];  Service: Orthopedics;  Laterality: Left;  . KNEE ARTHROTOMY Left 03/05/2014   Procedure:  LEFT KNEE ARTHROTOMY/SCAR EXCISION/POLYETHYLENE REVISION;  Surgeon: Gearlean Alf, MD;  Location: [facility];  Service: Orthopedics;  Laterality: Left;  . KNEE CLOSED REDUCTION Left 02/15/2013   Procedure: CLOSED MANIPULATION LEFT KNEE;  Surgeon: Gearlean Alf, MD;  Location: [facility];  Service: Orthopedics;  Laterality: Left;  . TOTAL HIP ARTHROPLASTY Right 12/24/2015   Procedure: TOTAL HIP ARTHROPLASTY ANTERIOR APPROACH;  Surgeon: Hessie Knows, MD;  Location: [facility] OR[facility];  Service: Orthopedics;  Laterality: Right;  . TOTAL HIP ARTHROPLASTY Left 02/10/2018   Procedure: TOTAL HIP ARTHROPLASTY ANTERIOR APPROACH;  Surgeon: Hessie Knows, MD;  Location: [facility];  Service: Orthopedics;  Laterality: Left;  . TOTAL KNEE REVISION Left 12/07/2012   Procedure:  TOTAL KNEE ARTHROPLASTY REVISION;  Surgeon: Gearlean Alf, MD;  Location: [facility];  Service: Orthopedics;  Laterality: Left;  . TUBAL LIGATION      There were no vitals filed for this visit.   Subjective Assessment - 03/07/18 0921    Subjective  Pt. s/p recent L THA on 02/10/18.  Pt. reports 5/10 L knee pain at best (being distracted).  Pt. known well to PT clinic secondary to significant L knee surgeries/ manipulations in past/ B THA.  Pt. recently completed L THA with home health with Cherly Anderson, PT, DPT.  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      Limitations  Walking;House hold activities;Lifting    Diagnostic tests  XRAYS    Patient Stated Goals  Walk without pain/sleep without R hip/knee pain/ Ambulate with no assistive device.      Currently in Pain?  Yes    Pain Score  5     Pain Location  Knee    Pain Orientation  Left    Pain Descriptors / Indicators  Constant         [facility] PT Assessment - 03/07/18 0001      Assessment   Medical Diagnosis  R knee pain/ joint stiffness    Referring Provider (PT)  Dr. Rozelle Logan    Onset Date/Surgical Date   02/10/18    Next MD Visit  03/23/18    Prior Therapy  yes, known well to PT      Precautions   Precautions  Anterior Hip      Restrictions   Weight Bearing Restrictions  No      Balance Screen   Has the patient fallen in the past 6 months  No    Has the patient had a decrease in activity level because of a fear of falling?   Yes    Is the patient reluctant to leave their home because of a fear of falling?   No      Prior Function   Level of Independence  Requires assistive device for independence      Cognition   Overall Cognitive Status  Within Functional Limits for tasks assessed        See HEP  FOTO: initial 13/ goal 56.      PT Education - 03/07/18 1528    Education provided  Yes    Education Details  Supine heel slides/ SLR (with assist)/ standing hip ex./ partial squats.      Person(s) Educated  Patient    Methods  Explanation;Demonstration    Comprehension  Verbalized understanding;Returned demonstration          PT Long Term Goals - 03/07/18 1724      PT LONG TERM GOAL #1   Title  Pt will increase L LE muscle strength 1/2 muscle grade to improve L SLR/ gait pattern without assistive device.      Baseline  Seated L knee A/PROM: flexion 61/66 deg./ extension -33 deg (ext. lag). R/L hip flex. 3+/5 MMT/ 2+/5 MMT. Hip abd. 4/5 MMT, 3/5 MMT. B adduction 5/5 MMT. R/L knee ext. 5/5 MMT, 4/5 MMT. R/L knee flex. 4+/5, 3/5 MMT. Supine R knee A/PROM: extension: -11/-7 deg. flexion: 120 deg. Supine L knee ext. AROM: -32 deg. Pt. unable to actively SLR     Time  4    Period  Weeks    Status  New    Target Date  04/04/18      PT LONG TERM GOAL #2   Title  Pt. will increase L knee flexion AROM to >90 deg. to improve sitting posture/ gait pattern.      Baseline  Seated L knee A/PROM: flexion 61/66 deg./ extension -33 deg (ext. lag).    Time  4    Period  Weeks    Status  New    Target Date  04/04/18      PT LONG TERM GOAL #3   Title  Pt. will increase FOTO  score to  56 to improve functional mobility.      Baseline  FOTO: initial 13    Time  4    Period  Weeks    Status  New    Target Date  04/04/18      PT LONG TERM GOAL #4   Title  Pt. able to ambulate with with use of SPC and consistent gait pattern to promote greater independence/ mobility.      Baseline  Moderate L antalgic gait pattern with use of RW    Time  4    Period  Weeks    Status  New    Target Date  04/04/18          Plan - 03/07/18 0925    Clinical Impression Statement  Pt. is a pleasant 67 y/o female with chronic L knee issues and s/p L THA on 02/10/18.  Pt. reports 5/10 L knee pain with movement and sharp increase in pain with PROM/ manual tx. to L knee.  Pt. ambulates with use of RW and limited L hip/knee flexion during swing through phase of gait.  Pt. requires cuing to correct step pattern/ heel strike due to limited L knee flexion/ extension.  Seated L knee A/PROM: flexion 61/66 deg./ extension -33 deg (ext. lag).  R/L hip flex. 3+/5 MMT/ 2+/5 MMT.  Hip abd. 4/5 MMT, 3/5 MMT.  B adduction 5/5 MMT.  R/L knee ext. 5/5 MMT, 4/5 MMT.  R/L knee flex. 4+/5, 3/5 MMT.   Supine R knee A/PROM: extension: -11/-7 deg. flexion: 120 deg.  Supine L knee ext. AROM: -32 deg.  Pt. unable to actively SLR on L LE due to moderate L hip muscle weakness.  FOTO: initial 13/ goal 56.  Pt. will benefit from skilled PT services to increase L knee ROM/ L LE muscle strengthening to improve pain-free mobility/ gait without assistive device.      Clinical Presentation  Evolving    Clinical Decision Making  Moderate    Rehab Potential  Fair    PT Frequency  2x / week    PT Duration  4 weeks    PT Treatment/Interventions  ADLs/Self Care Home Management;Gait training;Functional mobility training;Therapeutic exercise;Balance training;Therapeutic activities;Neuromuscular re-education;Patient/family education;Manual techniques;Passive range of motion;Cryotherapy;Electrical Stimulation;Moist Heat    PT Next Visit  Plan  Increase L knee ROM/ LE strengthening in hip/knee.      Consulted and Agree with Plan of Care  Patient       Patient will benefit from skilled therapeutic intervention in order to improve the following deficits and impairments:  Abnormal gait, Decreased activity tolerance, Decreased balance, Decreased endurance, Decreased range of motion, Difficulty walking, Hypomobility, Decreased strength, Pain, Improper body mechanics, Decreased mobility, Decreased scar mobility  Visit Diagnosis: Chronic pain of left knee  Joint stiffness of knee, left  Muscle weakness (generalized)  Gait difficulty     Problem List Patient Active Problem List   Diagnosis Date Noted  . Status post total hip replacement, left 02/10/2018  . Primary localized osteoarthritis of right hip 12/24/2015  . Arthrofibrosis of knee joint 03/05/2014  . Hypokalemia 05/30/2013  . OA (osteoarthritis) of knee 05/29/2013  . Postoperative stiffness of total knee replacement ([place]) 02/15/2013  . Postoperative anemia due to acute blood loss 12/09/2012  . Failed total knee arthroplasty ([place]) 12/07/2012   Pura Spice, PT, DPT # [id number] 03/07/2018, 5:42 PM  [facility] [facility] [address] [place], [place], [address] Phone:  [phone]   Fax:  [phone]  Name: [name] [name] MRN: [id number] Date of Birth: 03/30/1951

## 2018-03-09 ENCOUNTER — Ambulatory Visit: Payer: Medicare HMO | Admitting: Physical Therapy

## 2018-03-10 ENCOUNTER — Encounter: Payer: Medicare HMO | Admitting: Physical Therapy

## 2018-03-10 ENCOUNTER — Encounter: Payer: Self-pay | Admitting: Physical Therapy

## 2018-03-10 ENCOUNTER — Ambulatory Visit: Payer: Medicare HMO | Admitting: Physical Therapy

## 2018-03-10 DIAGNOSIS — M25562 Pain in left knee: Principal | ICD-10-CM

## 2018-03-10 DIAGNOSIS — R269 Unspecified abnormalities of gait and mobility: Secondary | ICD-10-CM

## 2018-03-10 DIAGNOSIS — M6281 Muscle weakness (generalized): Secondary | ICD-10-CM

## 2018-03-10 DIAGNOSIS — G8929 Other chronic pain: Secondary | ICD-10-CM

## 2018-03-10 DIAGNOSIS — M25662 Stiffness of left knee, not elsewhere classified: Secondary | ICD-10-CM

## 2018-03-11 NOTE — Therapy (Addendum)
[facility] [address] [place], [place], [address] Phone: [phone]   Fax:  [phone]  Physical Therapy Treatment  Patient Details  Name: [name] [name] MRN: [id number] Date of Birth: 11/29/1950 Referring Provider (PT): Dr. Rozelle Logan   Encounter Date: 03/10/2018  PT End of Session - 03/14/18 0814    Visit Number  2    Number of Visits  8    Date for PT Re-Evaluation  04/04/18    PT Start Time  4097    PT Stop Time  1647    PT Time Calculation (min)  52 min    Activity Tolerance  Patient tolerated treatment well;Patient limited by pain    Behavior During Therapy  [facility] for tasks assessed/performed       Past Medical History:  Diagnosis Date  . Arthritis   . Complication of anesthesia    terrible vertigo  . Hypertension   . PONV (postoperative nausea and vomiting)    related to vertigo  . Shortness of breath    WITH EXERTION  . Vertigo    hx post op    Past Surgical History:  Procedure Laterality Date  . BACK SURGERY  2010   cervical fusion  . BREAST BIOPSY Left 03/31/2011   fibrocystic changes, marked stromal sclerosis, and dense stromal calcifications  . BREAST BIOPSY Left 03/31/2011  . BREAST BIOPSY Right 03/31/2011   fine needle aspiration with clip placement - ultrasound  . BREAST SURGERY Left 2005   biopsy  . CARPAL TUNNEL RELEASE Right   . FOOT SURGERY Left    heel---STATES METAL IN THE HEEL - FOOT TURNS OUTWARD  . HEEL SPUR SURGERY    . HYSTEROSCOPY W/D&C N/A 07/26/2015   Procedure: DILATATION AND CURETTAGE /HYSTEROSCOPY with cervical biopsy;  Surgeon: Benjaman Kindler, MD;  Location: [facility];  Service: Gynecology;  Laterality: N/A;  . JOINT REPLACEMENT Left 2013   knee  . KNEE ARTHROTOMY Left 05/29/2013   Procedure: LEFT KNEE ARTHROTOMY WITH SCAR EXCISION;  Surgeon: Gearlean Alf, MD;  [facility]: [facility];  Service: Orthopedics;  Laterality: Left;  . KNEE ARTHROTOMY Left 03/05/2014   Procedure:  LEFT KNEE ARTHROTOMY/SCAR EXCISION/POLYETHYLENE REVISION;  Surgeon: Gearlean Alf, MD;  [facility]: [facility];  Service: Orthopedics;  Laterality: Left;  . KNEE CLOSED REDUCTION Left 02/15/2013   Procedure: CLOSED MANIPULATION LEFT KNEE;  Surgeon: Gearlean Alf, MD;  [facility]: [facility];  Service: Orthopedics;  Laterality: Left;  . TOTAL HIP ARTHROPLASTY Right 12/24/2015   Procedure: TOTAL HIP ARTHROPLASTY ANTERIOR APPROACH;  Surgeon: Hessie Knows, MD;  Location: [facility];  Service: Orthopedics;  Laterality: Right;  . TOTAL HIP ARTHROPLASTY Left 02/10/2018   Procedure: TOTAL HIP ARTHROPLASTY ANTERIOR APPROACH;  Surgeon: Hessie Knows, MD;  Location: [facility];  Service: Orthopedics;  Laterality: Left;  . TOTAL KNEE REVISION Left 12/07/2012   Procedure:  TOTAL KNEE ARTHROPLASTY REVISION;  Surgeon: Gearlean Alf, MD;  [facility];  Service: Orthopedics;  Laterality: Left;  . TUBAL LIGATION      There were no vitals filed for this visit.      Pt. reports returning to sitting with pt. at [facility] and playing billiards today. Pt. states she is still in L knee and entered PT with heavy use of RW.        There.ex.:   Reviewed HEP Nustep L5 seat position 12 10 min. (cuing to increase L knee flexion/ static holds) Standing hip flexion/ abd./  extension 20x (mirror feedback)  Manual tx.:  Seated on blue mat table with contract-relax technique to L knee 10x (moderate resistance) Supine on mat table L hip/ knee manual isometrics 10x Supine L knee flexion 5x with static holds 30 sec. Each. Patellar mobs. (significant hypomobility).          PT Long Term Goals - 03/07/18 1724      PT LONG TERM GOAL #1   Title  Pt will increase L LE muscle strength 1/2 muscle grade to improve L SLR/ gait pattern without assistive device.      Baseline  Seated L knee A/PROM: flexion 61/66 deg./ extension -33 deg (ext. lag). R/L hip flex. 3+/5 MMT/ 2+/5 MMT. Hip abd. 4/5 MMT, 3/5 MMT. B adduction 5/5  MMT. R/L knee ext. 5/5 MMT, 4/5 MMT. R/L knee flex. 4+/5, 3/5 MMT. Supine R knee A/PROM: extension: -11/-7 deg. flexion: 120 deg. Supine L knee ext. AROM: -32 deg. Pt. unable to actively SLR     Time  4    Period  Weeks    Status  New    Target Date  04/04/18      PT LONG TERM GOAL #2   Title  Pt. will increase L knee flexion AROM to >90 deg. to improve sitting posture/ gait pattern.      Baseline  Seated L knee A/PROM: flexion 61/66 deg./ extension -33 deg (ext. lag).    Time  4    Period  Weeks    Status  New    Target Date  04/04/18      PT LONG TERM GOAL #3   Title  Pt. will increase FOTO score to 56 to improve functional mobility.      Baseline  FOTO: initial 13    Time  4    Period  Weeks    Status  New    Target Date  04/04/18      PT LONG TERM GOAL #4   Title  Pt. able to ambulate with with use of SPC and consistent gait pattern to promote greater independence/ mobility.      Baseline  Moderate L antalgic gait pattern with use of RW    Time  4    Period  Weeks    Status  New    Target Date  04/04/18         Plan - 03/14/18 0815    Clinical Impression Statement  Significant L knee joint hypomobility with flexion/ extension in all positions.  Moderate L hip weakness and unable to complete a proper SLR in supine position without compensatory movement patterns.  Cuing to increase L hip flexion/ step pattern/ heel strike during gait with RW and progression into //-bars.      Clinical Presentation  Evolving    Clinical Decision Making  Moderate    Rehab Potential  Fair    PT Frequency  2x / week    PT Duration  4 weeks    PT Treatment/Interventions  ADLs/Self Care Home Management;Gait training;Functional mobility training;Therapeutic exercise;Balance training;Therapeutic activities;Neuromuscular re-education;Patient/family education;Manual techniques;Passive range of motion;Cryotherapy;Electrical Stimulation;Moist Heat    PT Next Visit Plan  Increase L knee ROM/ LE  strengthening in hip/knee.      Consulted and Agree with Plan of Care  Patient       Patient will benefit from skilled therapeutic intervention in order to improve the following deficits and impairments:  Abnormal gait, Decreased activity tolerance, Decreased balance, Decreased endurance, Decreased range of  motion, Difficulty walking, Hypomobility, Decreased strength, Pain, Improper body mechanics, Decreased mobility, Decreased scar mobility  Visit Diagnosis: Chronic pain of left knee  Joint stiffness of knee, left  Muscle weakness (generalized)  Gait difficulty     Problem List Patient Active Problem List   Diagnosis Date Noted  . Status post total hip replacement, left 02/10/2018  . Primary localized osteoarthritis of right hip 12/24/2015  . Arthrofibrosis of knee joint 03/05/2014  . Hypokalemia 05/30/2013  . OA (osteoarthritis) of knee 05/29/2013  . Postoperative stiffness of total knee replacement ([place]) 02/15/2013  . Postoperative anemia due to acute blood loss 12/09/2012  . 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

## 2018-03-14 ENCOUNTER — Encounter: Payer: Self-pay | Admitting: Physical Therapy

## 2018-03-14 ENCOUNTER — Ambulatory Visit: Payer: Medicare HMO | Admitting: Physical Therapy

## 2018-03-14 DIAGNOSIS — M25562 Pain in left knee: Secondary | ICD-10-CM | POA: Diagnosis not present

## 2018-03-14 DIAGNOSIS — M25662 Stiffness of left knee, not elsewhere classified: Secondary | ICD-10-CM

## 2018-03-14 DIAGNOSIS — R269 Unspecified abnormalities of gait and mobility: Secondary | ICD-10-CM

## 2018-03-14 DIAGNOSIS — G8929 Other chronic pain: Secondary | ICD-10-CM

## 2018-03-14 DIAGNOSIS — M6281 Muscle weakness (generalized): Secondary | ICD-10-CM

## 2018-03-14 NOTE — Therapy (Signed)
[place] [facility] [address] [place], [place], [address] Phone: [phone]   Fax:  [phone]  Physical Therapy Treatment  Patient Details  Name: [name] [name] MRN: [id number] Date of Birth: August 24, 1950 Referring Provider (PT): Dr. Rozelle Logan   Encounter Date: 03/14/2018  PT End of Session - 03/14/18 0838    Visit Number  3    Number of Visits  8    Date for PT Re-Evaluation  04/04/18    PT Start Time  0813    PT Stop Time  0911    PT Time Calculation (min)  58 min    Activity Tolerance  Patient tolerated treatment well;Patient limited by pain    Behavior During Therapy  [facility] for tasks assessed/performed       Past Medical History:  Diagnosis Date  . Arthritis   . Complication of anesthesia    terrible vertigo  . Hypertension   . PONV (postoperative nausea and vomiting)    related to vertigo  . Shortness of breath    WITH EXERTION  . Vertigo    hx post op    Past Surgical History:  Procedure Laterality Date  . BACK SURGERY  2010   cervical fusion  . BREAST BIOPSY Left 03/31/2011   fibrocystic changes, marked stromal sclerosis, and dense stromal calcifications  . BREAST BIOPSY Left 03/31/2011  . BREAST BIOPSY Right 03/31/2011   fine needle aspiration with clip placement - ultrasound  . BREAST SURGERY Left 2005   biopsy  . CARPAL TUNNEL RELEASE Right   . FOOT SURGERY Left    heel---STATES METAL IN THE HEEL - FOOT TURNS OUTWARD  . HEEL SPUR SURGERY    . HYSTEROSCOPY W/D&C N/A 07/26/2015   Procedure: DILATATION AND CURETTAGE /HYSTEROSCOPY with cervical biopsy;  Surgeon: Benjaman Kindler, MD;  Location: [facility];  Service: Gynecology;  Laterality: N/A;  . JOINT REPLACEMENT Left 2013   knee  . KNEE ARTHROTOMY Left 05/29/2013   Procedure: LEFT KNEE ARTHROTOMY WITH SCAR EXCISION;  Surgeon: Gearlean Alf, MD;  [facility];  Service: Orthopedics;  Laterality: Left;  . KNEE ARTHROTOMY Left 03/05/2014   Procedure:  LEFT KNEE ARTHROTOMY/SCAR EXCISION/POLYETHYLENE REVISION;  Surgeon: Gearlean Alf, MD;  [facility];  Service: Orthopedics;  Laterality: Left;  . KNEE CLOSED REDUCTION Left 02/15/2013   Procedure: CLOSED MANIPULATION LEFT KNEE;  Surgeon: Gearlean Alf, MD;  [facility];  Service: Orthopedics;  Laterality: Left;  . TOTAL HIP ARTHROPLASTY Right 12/24/2015   Procedure: TOTAL HIP ARTHROPLASTY ANTERIOR APPROACH;  Surgeon: Hessie Knows, MD;  Location: [facility];  Service: Orthopedics;  Laterality: Right;  . TOTAL HIP ARTHROPLASTY Left 02/10/2018   Procedure: TOTAL HIP ARTHROPLASTY ANTERIOR APPROACH;  Surgeon: Hessie Knows, MD;  Location: [facility];  Service: Orthopedics;  Laterality: Left;  . TOTAL KNEE REVISION Left 12/07/2012   Procedure:  TOTAL KNEE ARTHROPLASTY REVISION;  Surgeon: Gearlean Alf, MD;  [facility];  Service: Orthopedics;  Laterality: Left;  . TUBAL LIGATION      There were no vitals filed for this visit.  Subjective Assessment - 03/14/18 0831    Subjective  Pt. reports she has continued to play pool with her pt. at [facility].  Pt. notes she is still experiencing slight numbness on lateral aspect of L LE.      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      Limitations  Walking;House hold activities;Lifting    Diagnostic tests  XRAYS    Patient Stated Goals  Walk without pain/sleep without R hip/knee pain/ Ambulate with no assistive device.      Currently in Pain?  Yes    Pain Score  4     Pain Location  Knee    Pain Orientation  Left    Pain Descriptors / Indicators  Constant         There.ex.:   Nustep L5 seat position 12 10 min. (cuing to increase L knee flexion/ static holds) Standing hip flexion/ abd./ extension 20x (mirror feedback) Forward/ backward/ lateral walking with exaggerated hip movement/ cuing for upright posture.    Manual tx.:  Seated on blue mat  table with contract-relax technique to L knee 10x (moderate resistance) Supine on mat table L hip/ knee manual isometrics 10x Supine L knee flexion 5x with static holds 30 sec. Each. Patellar mobs. All planes of movement (significant hypomobility).       PT Long Term Goals - 03/07/18 1724      PT LONG TERM GOAL #1   Title  Pt will increase L LE muscle strength 1/2 muscle grade to improve L SLR/ gait pattern without assistive device.      Baseline  Seated L knee A/PROM: flexion 61/66 deg./ extension -33 deg (ext. lag). R/L hip flex. 3+/5 MMT/ 2+/5 MMT. Hip abd. 4/5 MMT, 3/5 MMT. B adduction 5/5 MMT. R/L knee ext. 5/5 MMT, 4/5 MMT. R/L knee flex. 4+/5, 3/5 MMT. Supine R knee A/PROM: extension: -11/-7 deg. flexion: 120 deg. Supine L knee ext. AROM: -32 deg. Pt. unable to actively SLR     Time  4    Period  Weeks    Status  New    Target Date  04/04/18      PT LONG TERM GOAL #2   Title  Pt. will increase L knee flexion AROM to >90 deg. to improve sitting posture/ gait pattern.      Baseline  Seated L knee A/PROM: flexion 61/66 deg./ extension -33 deg (ext. lag).    Time  4    Period  Weeks    Status  New    Target Date  04/04/18      PT LONG TERM GOAL #3   Title  Pt. will increase FOTO score to 56 to improve functional mobility.      Baseline  FOTO: initial 13    Time  4    Period  Weeks    Status  New    Target Date  04/04/18      PT LONG TERM GOAL #4   Title  Pt. able to ambulate with with use of SPC and consistent gait pattern to promote greater independence/ mobility.      Baseline  Moderate L antalgic gait pattern with use of RW    Time  4    Period  Weeks    Status  New    Target Date  04/04/18         Plan - 03/14/18 1638    Clinical Impression Statement  L knee joint stiffness and L hip muscle weakness remain primary focus of PT tx. session.  Pt. has difficulty tolerating any aggressive L knee ROM/ stretching and benefits from long static holds/ contract-relax  technique.  Moderate cuing to increase L heel strike/ decrease toe walking during gait pattern with use of RW.  Pt.  able to complete a SLR but benefit from light PT assist with multiple sets/reps.      Clinical Presentation  Evolving    Clinical Decision Making  Moderate    Rehab Potential  Fair    PT Frequency  2x / week    PT Duration  4 weeks    PT Treatment/Interventions  ADLs/Self Care Home Management;Gait training;Functional mobility training;Therapeutic exercise;Balance training;Therapeutic activities;Neuromuscular re-education;Patient/family education;Manual techniques;Passive range of motion;Cryotherapy;Electrical Stimulation;Moist Heat    PT Next Visit Plan  Increase L knee ROM/ LE strengthening in hip/knee.      PT Home Exercise Plan  reviewed HEP       Patient will benefit from skilled therapeutic intervention in order to improve the following deficits and impairments:  Abnormal gait, Decreased activity tolerance, Decreased balance, Decreased endurance, Decreased range of motion, Difficulty walking, Hypomobility, Decreased strength, Pain, Improper body mechanics, Decreased mobility, Decreased scar mobility  Visit Diagnosis: Chronic pain of left knee  Joint stiffness of knee, left  Muscle weakness (generalized)  Gait difficulty     Problem List Patient Active Problem List   Diagnosis Date Noted  . Status post total hip replacement, left 02/10/2018  . Primary localized osteoarthritis of right hip 12/24/2015  . Arthrofibrosis of knee joint 03/05/2014  . Hypokalemia 05/30/2013  . OA (osteoarthritis) of knee 05/29/2013  . Postoperative stiffness of total knee replacement ([place]) 02/15/2013  . Postoperative anemia due to acute blood loss 12/09/2012  . 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

## 2018-03-16 ENCOUNTER — Encounter: Payer: Medicare HMO | Admitting: Physical Therapy

## 2018-03-17 ENCOUNTER — Ambulatory Visit: Payer: Medicare HMO | Admitting: Physical Therapy

## 2018-03-17 ENCOUNTER — Encounter: Payer: Self-pay | Admitting: Physical Therapy

## 2018-03-17 DIAGNOSIS — M25562 Pain in left knee: Principal | ICD-10-CM

## 2018-03-17 DIAGNOSIS — R269 Unspecified abnormalities of gait and mobility: Secondary | ICD-10-CM

## 2018-03-17 DIAGNOSIS — M25662 Stiffness of left knee, not elsewhere classified: Secondary | ICD-10-CM

## 2018-03-17 DIAGNOSIS — G8929 Other chronic pain: Secondary | ICD-10-CM

## 2018-03-17 DIAGNOSIS — M6281 Muscle weakness (generalized): Secondary | ICD-10-CM

## 2018-03-17 NOTE — Therapy (Signed)
[place] [facility] [address] [place], [place], [address] Phone: [phone]   Fax:  [phone]  Physical Therapy Treatment  Patient Details  Name: [name] [name] MRN: [id number] Date of Birth: November 19, 1950 Referring Provider (PT): Dr. Rozelle Logan   Encounter Date: 03/17/2018  PT End of Session - 03/17/18 0816    Visit Number  4    Number of Visits  8    Date for PT Re-Evaluation  04/04/18    PT Start Time  0816    PT Stop Time  0910    PT Time Calculation (min)  54 min    Activity Tolerance  Patient tolerated treatment well;Patient limited by pain    Behavior During Therapy  [facility] for tasks assessed/performed       Past Medical History:  Diagnosis Date  . Arthritis   . Complication of anesthesia    terrible vertigo  . Hypertension   . PONV (postoperative nausea and vomiting)    related to vertigo  . Shortness of breath    WITH EXERTION  . Vertigo    hx post op    Past Surgical History:  Procedure Laterality Date  . BACK SURGERY  2010   cervical fusion  . BREAST BIOPSY Left 03/31/2011   fibrocystic changes, marked stromal sclerosis, and dense stromal calcifications  . BREAST BIOPSY Left 03/31/2011  . BREAST BIOPSY Right 03/31/2011   fine needle aspiration with clip placement - ultrasound  . BREAST SURGERY Left 2005   biopsy  . CARPAL TUNNEL RELEASE Right   . FOOT SURGERY Left    heel---STATES METAL IN THE HEEL - FOOT TURNS OUTWARD  . HEEL SPUR SURGERY    . HYSTEROSCOPY W/D&C N/A 07/26/2015   Procedure: DILATATION AND CURETTAGE /HYSTEROSCOPY with cervical biopsy;  Surgeon: Benjaman Kindler, MD;  Location: [facility];  Service: Gynecology;  Laterality: N/A;  . JOINT REPLACEMENT Left 2013   knee  . KNEE ARTHROTOMY Left 05/29/2013   Procedure: LEFT KNEE ARTHROTOMY WITH SCAR EXCISION;  Surgeon: Gearlean Alf, MD;  [facility];  Service: Orthopedics;  Laterality: Left;  . KNEE ARTHROTOMY Left 03/05/2014   Procedure:  LEFT KNEE ARTHROTOMY/SCAR EXCISION/POLYETHYLENE REVISION;  Surgeon: Gearlean Alf, MD;  [facility];  Service: Orthopedics;  Laterality: Left;  . KNEE CLOSED REDUCTION Left 02/15/2013   Procedure: CLOSED MANIPULATION LEFT KNEE;  Surgeon: Gearlean Alf, MD;  [facility];  Service: Orthopedics;  Laterality: Left;  . TOTAL HIP ARTHROPLASTY Right 12/24/2015   Procedure: TOTAL HIP ARTHROPLASTY ANTERIOR APPROACH;  Surgeon: Hessie Knows, MD;  Location: [facility];  Service: Orthopedics;  Laterality: Right;  . TOTAL HIP ARTHROPLASTY Left 02/10/2018   Procedure: TOTAL HIP ARTHROPLASTY ANTERIOR APPROACH;  Surgeon: Hessie Knows, MD;  Location: [facility];  Service: Orthopedics;  Laterality: Left;  . TOTAL KNEE REVISION Left 12/07/2012   Procedure:  TOTAL KNEE ARTHROPLASTY REVISION;  Surgeon: Gearlean Alf, MD;  [facility];  Service: Orthopedics;  Laterality: Left;  . TUBAL LIGATION      There were no vitals filed for this visit.  Subjective Assessment - 03/17/18 0816    Subjective  Pt. reports being freezing cold due to weather outside.  Pt. entered PT walking on L toe and PT cued pt. to increase heel strike.  Pt. reports low pain score today and states she is sleeping better.      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      Limitations  Walking;House hold activities;Lifting    Diagnostic tests  XRAYS    Patient Stated Goals  Walk without pain/sleep without R hip/knee pain/ Ambulate with no assistive device.      Currently in Pain?  Yes    Pain Location  Knee    Pain Orientation  Left         There.ex.:   Nustep L5 seat position 11 10 min. (cuing to increase L knee flexion/ static holds) Standing TKE with quad control focus/ step ups and downs on L LE in //-bars with UE assist.   Standing hip flexion/ abd./ extension 20x (mirror feedback).  Forward/ backward/ lateral walking with exaggerated  hip movement/ cuing for upright posture.  Light L UE assist/ heavy R UE assist. Supine L SLR/ hip flexion 10x2 (PT assist).    Manual tx.:  Seated on blue mat table with contract-relax technique to L knee 10x (moderate resistance) Supine on mat table L hip/ knee manual isometrics 10x Supine L knee flexion 5x with static holds 30 sec. Each. Patellar mobs. All planes of movement (significant hypomobility).     PT Long Term Goals - 03/07/18 1724      PT LONG TERM GOAL #1   Title  Pt will increase L LE muscle strength 1/2 muscle grade to improve L SLR/ gait pattern without assistive device.      Baseline  Seated L knee A/PROM: flexion 61/66 deg./ extension -33 deg (ext. lag). R/L hip flex. 3+/5 MMT/ 2+/5 MMT. Hip abd. 4/5 MMT, 3/5 MMT. B adduction 5/5 MMT. R/L knee ext. 5/5 MMT, 4/5 MMT. R/L knee flex. 4+/5, 3/5 MMT. Supine R knee A/PROM: extension: -11/-7 deg. flexion: 120 deg. Supine L knee ext. AROM: -32 deg. Pt. unable to actively SLR     Time  4    Period  Weeks    Status  New    Target Date  04/04/18      PT LONG TERM GOAL #2   Title  Pt. will increase L knee flexion AROM to >90 deg. to improve sitting posture/ gait pattern.      Baseline  Seated L knee A/PROM: flexion 61/66 deg./ extension -33 deg (ext. lag).    Time  4    Period  Weeks    Status  New    Target Date  04/04/18      PT LONG TERM GOAL #3   Title  Pt. will increase FOTO score to 56 to improve functional mobility.      Baseline  FOTO: initial 13    Time  4    Period  Weeks    Status  New    Target Date  04/04/18      PT LONG TERM GOAL #4   Title  Pt. able to ambulate with with use of SPC and consistent gait pattern to promote greater independence/ mobility.      Baseline  Moderate L antalgic gait pattern with use of RW    Time  4    Period  Weeks    Status  New    Target Date  04/04/18            Plan - 03/17/18 0817    Clinical Impression Statement  Pt. reports several times during tx.  session that the numbness is getting better and she still doesn't have the strength to walk without assistive device.  Significant difficulty with L LE step ups on 1st step with heavy use of B UE (esp. L UE).  Extra time and SBA/CGA required for safety.  Pt. requires min. PT assist with L SLR/ hip flexion in supine position due to L hip weakness/ knee pain.      Clinical Presentation  Evolving    Clinical Decision Making  Moderate    Rehab Potential  Fair    PT Frequency  2x / week    PT Duration  4 weeks    PT Treatment/Interventions  ADLs/Self Care Home Management;Gait training;Functional mobility training;Therapeutic exercise;Balance training;Therapeutic activities;Neuromuscular re-education;Patient/family education;Manual techniques;Passive range of motion;Cryotherapy;Electrical Stimulation;Moist Heat    PT Next Visit Plan  Increase L knee ROM/ LE strengthening in hip/knee.      PT Home Exercise Plan  reviewed HEP       Patient will benefit from skilled therapeutic intervention in order to improve the following deficits and impairments:  Abnormal gait, Decreased activity tolerance, Decreased balance, Decreased endurance, Decreased range of motion, Difficulty walking, Hypomobility, Decreased strength, Pain, Improper body mechanics, Decreased mobility, Decreased scar mobility  Visit Diagnosis: Chronic pain of left knee  Joint stiffness of knee, left  Muscle weakness (generalized)  Gait difficulty     Problem List Patient Active Problem List   Diagnosis Date Noted  . Status post total hip replacement, left 02/10/2018  . Primary localized osteoarthritis of right hip 12/24/2015  . Arthrofibrosis of knee joint 03/05/2014  . Hypokalemia 05/30/2013  . OA (osteoarthritis) of knee 05/29/2013  . Postoperative stiffness of total knee replacement ([place]) 02/15/2013  . Postoperative anemia due to acute blood loss 12/09/2012  . Failed total knee arthroplasty ([place]) 12/07/2012   Pura Spice, PT, DPT # [id number] 03/17/2018, 9:23 AM  [facility] [address] [place], [place], [address] Phone: [phone]   Fax:  [phone]  Name: [name] [name] MRN: [id number] Date of Birth: December 13, 1950

## 2018-03-21 ENCOUNTER — Ambulatory Visit: Payer: Medicare HMO | Admitting: Physical Therapy

## 2018-03-21 ENCOUNTER — Encounter: Payer: Self-pay | Admitting: Physical Therapy

## 2018-03-21 DIAGNOSIS — M25562 Pain in left knee: Secondary | ICD-10-CM | POA: Diagnosis not present

## 2018-03-21 DIAGNOSIS — M25662 Stiffness of left knee, not elsewhere classified: Secondary | ICD-10-CM

## 2018-03-21 DIAGNOSIS — M6281 Muscle weakness (generalized): Secondary | ICD-10-CM

## 2018-03-21 DIAGNOSIS — R269 Unspecified abnormalities of gait and mobility: Secondary | ICD-10-CM

## 2018-03-21 DIAGNOSIS — G8929 Other chronic pain: Secondary | ICD-10-CM

## 2018-03-21 NOTE — Therapy (Signed)
[facility] [address] [place], [place], [address] Phone: [phone]   Fax:  [phone]  Physical Therapy Treatment  Patient Details  Name: [name] [name] MRN: [id number] Date of Birth: 27-Feb-1951 Referring Provider (PT): Dr. Rozelle Logan   Encounter Date: 03/21/2018  PT End of Session - 03/21/18 0832    Visit Number  5    Number of Visits  8    Date for PT Re-Evaluation  04/04/18    PT Start Time  0830    PT Stop Time  0924    PT Time Calculation (min)  54 min    Activity Tolerance  Patient tolerated treatment well;Patient limited by pain    Behavior During Therapy  [facility] for tasks assessed/performed       Past Medical History:  Diagnosis Date  . Arthritis   . Complication of anesthesia    terrible vertigo  . Hypertension   . PONV (postoperative nausea and vomiting)    related to vertigo  . Shortness of breath    WITH EXERTION  . Vertigo    hx post op    Past Surgical History:  Procedure Laterality Date  . BACK SURGERY  2010   cervical fusion  . BREAST BIOPSY Left 03/31/2011   fibrocystic changes, marked stromal sclerosis, and dense stromal calcifications  . BREAST BIOPSY Left 03/31/2011  . BREAST BIOPSY Right 03/31/2011   fine needle aspiration with clip placement - ultrasound  . BREAST SURGERY Left 2005   biopsy  . CARPAL TUNNEL RELEASE Right   . FOOT SURGERY Left    heel---STATES METAL IN THE HEEL - FOOT TURNS OUTWARD  . HEEL SPUR SURGERY    . HYSTEROSCOPY W/D&C N/A 07/26/2015   Procedure: DILATATION AND CURETTAGE /HYSTEROSCOPY with cervical biopsy;  Surgeon: Benjaman Kindler, MD;  Location: [facility];  Service: Gynecology;  Laterality: N/A;  . JOINT REPLACEMENT Left 2013   knee  . KNEE ARTHROTOMY Left 05/29/2013   Procedure: LEFT KNEE ARTHROTOMY WITH SCAR EXCISION;  Surgeon: Gearlean Alf, MD;  [facility]: [facility];  Service: Orthopedics;  Laterality: Left;  . KNEE ARTHROTOMY Left 03/05/2014   Procedure:  LEFT KNEE ARTHROTOMY/SCAR EXCISION/POLYETHYLENE REVISION;  Surgeon: Gearlean Alf, MD;  [facility];  Service: Orthopedics;  Laterality: Left;  . KNEE CLOSED REDUCTION Left 02/15/2013   Procedure: CLOSED MANIPULATION LEFT KNEE;  Surgeon: Gearlean Alf, MD;  [facility];  Service: Orthopedics;  Laterality: Left;  . TOTAL HIP ARTHROPLASTY Right 12/24/2015   Procedure: TOTAL HIP ARTHROPLASTY ANTERIOR APPROACH;  Surgeon: Hessie Knows, MD;  Location: [facility];  Service: Orthopedics;  Laterality: Right;  . TOTAL HIP ARTHROPLASTY Left 02/10/2018   Procedure: TOTAL HIP ARTHROPLASTY ANTERIOR APPROACH;  Surgeon: Hessie Knows, MD;  Location: [facility];  Service: Orthopedics;  Laterality: Left;  . TOTAL KNEE REVISION Left 12/07/2012   Procedure:  TOTAL KNEE ARTHROPLASTY REVISION;  Surgeon: Gearlean Alf, MD;  [facility];  Service: Orthopedics;  Laterality: Left;  . TUBAL LIGATION      There were no vitals filed for this visit.  Subjective Assessment - 03/21/18 0831    Subjective  Pt. states she is stiff ths morning.  No c/o pain at this time.  Pt. had some pain this morning and did some leg raises before going back to sleep.      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      Limitations  Walking;House hold activities;Lifting    How long can you stand comfortably?  as soon as standing up    How long can you walk comfortably?  as soon as starts     Diagnostic tests  XRAYS    Patient Stated Goals  Walk without pain/sleep without R hip/knee pain/ Ambulate with no assistive device.      Currently in Pain?  No/denies        There.ex.:   Nustep L6 seat position 11 10 min. (cuing to increase L knee flexion/ static holds) Forward/ backward/ lateral walking with exaggerated hip movement/ cuing for upright posture.Light L UE assist/ heavy R UE assist. Resisted walking 2BTB 5x all 4-planes (use of B UE  for safety)- mirror feedback. Standing TKE with quad control focus/ step ups and downs on L LE in //-bars with UE assist.    Gait training:   Ambulate in //-bars with focus on heel strike/toe off on R LE with consistent step pattern length.  Working on R LE stance phase during L LE swing through with decrease UE assist.  Pt. Has difficulty initiating movement on R with wt. Bearing when using L UE on //-bars.  Poor posture/ head position.    Manual tx.:  Seated on blue mat table with contract-relax technique to L knee 10x (moderate resistance) Supine on mat table L hip/ knee manual isometrics 10x Supine L knee flexion 5x with static holds 30 sec. Each. Patellar mobs.All planes of movement(significant hypomobility).    PT Long Term Goals - 03/07/18 1724      PT LONG TERM GOAL #1   Title  Pt will increase L LE muscle strength 1/2 muscle grade to improve L SLR/ gait pattern without assistive device.      Baseline  Seated L knee A/PROM: flexion 61/66 deg./ extension -33 deg (ext. lag). R/L hip flex. 3+/5 MMT/ 2+/5 MMT. Hip abd. 4/5 MMT, 3/5 MMT. B adduction 5/5 MMT. R/L knee ext. 5/5 MMT, 4/5 MMT. R/L knee flex. 4+/5, 3/5 MMT. Supine R knee A/PROM: extension: -11/-7 deg. flexion: 120 deg. Supine L knee ext. AROM: -32 deg. Pt. unable to actively SLR     Time  4    Period  Weeks    Status  New    Target Date  04/04/18      PT LONG TERM GOAL #2   Title  Pt. will increase L knee flexion AROM to >90 deg. to improve sitting posture/ gait pattern.      Baseline  Seated L knee A/PROM: flexion 61/66 deg./ extension -33 deg (ext. lag).    Time  4    Period  Weeks    Status  New    Target Date  04/04/18      PT LONG TERM GOAL #3   Title  Pt. will increase FOTO score to 56 to improve functional mobility.      Baseline  FOTO: initial 13    Time  4    Period  Weeks    Status  New    Target Date  04/04/18      PT LONG TERM GOAL #4   Title  Pt. able to ambulate with with use of SPC and  consistent gait pattern to promote greater independence/ mobility.      Baseline  Moderate L antalgic gait pattern with use of RW    Time  4    Period  Weeks    Status  New    Target Date  04/04/18            Plan - 03/21/18 8466    Clinical Impression Statement  L quad/ hamstring muscle tightness and knee joint hypomobility limits independence with gait.  Pt. able to ambulate in //-bars with less UE assist but not ready to progress to [facility] at this time.  Pt. has difficulty with L LE wt. bearing during R swingthrough phase of gait due to muscle weakness/ limited ROM.  Pt. has Nustep coming to house this Thursday.      Clinical Presentation  Evolving    Clinical Decision Making  Moderate    Rehab Potential  Fair    PT Frequency  2x / week    PT Duration  4 weeks    PT Treatment/Interventions  ADLs/Self Care Home Management;Gait training;Functional mobility training;Therapeutic exercise;Balance training;Therapeutic activities;Neuromuscular re-education;Patient/family education;Manual techniques;Passive range of motion;Cryotherapy;Electrical Stimulation;Moist Heat    PT Next Visit Plan  Increase L knee ROM/ LE strengthening in hip/knee.      PT Home Exercise Plan  reviewed HEP    Consulted and Agree with Plan of Care  Patient       Patient will benefit from skilled therapeutic intervention in order to improve the following deficits and impairments:  Abnormal gait, Decreased activity tolerance, Decreased balance, Decreased endurance, Decreased range of motion, Difficulty walking, Hypomobility, Decreased strength, Pain, Improper body mechanics, Decreased mobility, Decreased scar mobility  Visit Diagnosis: Chronic pain of left knee  Joint stiffness of knee, left  Muscle weakness (generalized)  Gait difficulty     Problem List Patient Active Problem List   Diagnosis Date Noted  . Status post total hip replacement, left 02/10/2018  . Primary localized osteoarthritis of right hip  12/24/2015  . Arthrofibrosis of knee joint 03/05/2014  . Hypokalemia 05/30/2013  . OA (osteoarthritis) of knee 05/29/2013  . Postoperative stiffness of total knee replacement ([place]) 02/15/2013  . Postoperative anemia due to acute blood loss 12/09/2012  . Failed total knee arthroplasty ([place]) 12/07/2012   Pura Spice, PT, DPT # [phone] 03/21/2018, 6:14 PM  [facility] [address] [place], [place], [address] Phone: [phone]   Fax:  [phone]  Name: [name] [name] MRN: [id number] Date of Birth: [birth year]

## 2018-03-23 ENCOUNTER — Encounter: Payer: Medicare HMO | Admitting: Physical Therapy

## 2018-03-24 ENCOUNTER — Ambulatory Visit: Payer: Medicare HMO | Admitting: Physical Therapy

## 2018-03-24 ENCOUNTER — Encounter: Payer: Self-pay | Admitting: Physical Therapy

## 2018-03-24 DIAGNOSIS — G8929 Other chronic pain: Secondary | ICD-10-CM

## 2018-03-24 DIAGNOSIS — M25562 Pain in left knee: Secondary | ICD-10-CM | POA: Diagnosis not present

## 2018-03-24 DIAGNOSIS — M6281 Muscle weakness (generalized): Secondary | ICD-10-CM

## 2018-03-24 DIAGNOSIS — R269 Unspecified abnormalities of gait and mobility: Secondary | ICD-10-CM

## 2018-03-24 DIAGNOSIS — M25662 Stiffness of left knee, not elsewhere classified: Secondary | ICD-10-CM

## 2018-03-24 NOTE — Therapy (Signed)
[facility] [address] [place], [place], [address] Phone: [phone]   Fax:  [phone]  Physical Therapy Treatment  Patient Details  Name: [name] [name] MRN: [id number] Date of Birth: 1951-01-24 Referring Provider (PT): Dr. Rozelle Logan   Encounter Date: 03/24/2018    Treatment: 6 of 8.  Recert date: 02/06/25 9485 to 1912   Past Medical History:  Diagnosis Date  . Arthritis   . Complication of anesthesia    terrible vertigo  . Hypertension   . PONV (postoperative nausea and vomiting)    related to vertigo  . Shortness of breath    WITH EXERTION  . Vertigo    hx post op    Past Surgical History:  Procedure Laterality Date  . BACK SURGERY  2010   cervical fusion  . BREAST BIOPSY Left 03/31/2011   fibrocystic changes, marked stromal sclerosis, and dense stromal calcifications  . BREAST BIOPSY Left 03/31/2011  . BREAST BIOPSY Right 03/31/2011   fine needle aspiration with clip placement - ultrasound  . BREAST SURGERY Left 2005   biopsy  . CARPAL TUNNEL RELEASE Right   . FOOT SURGERY Left    heel---STATES METAL IN THE HEEL - FOOT TURNS OUTWARD  . HEEL SPUR SURGERY    . HYSTEROSCOPY W/D&C N/A 07/26/2015   Procedure: DILATATION AND CURETTAGE /HYSTEROSCOPY with cervical biopsy;  Surgeon: Benjaman Kindler, MD;  Location: [facility];  Service: Gynecology;  Laterality: N/A;  . JOINT REPLACEMENT Left 2013   knee  . KNEE ARTHROTOMY Left 05/29/2013   Procedure: LEFT KNEE ARTHROTOMY WITH SCAR EXCISION;  Surgeon: Gearlean Alf, MD;  [facility];  Service: Orthopedics;  Laterality: Left;  . KNEE ARTHROTOMY Left 03/05/2014   Procedure: LEFT KNEE ARTHROTOMY/SCAR EXCISION/POLYETHYLENE REVISION;  Surgeon: Gearlean Alf, MD;  [facility];  Service: Orthopedics;  Laterality: Left;  . KNEE CLOSED REDUCTION Left 02/15/2013   Procedure: CLOSED MANIPULATION LEFT KNEE;  Surgeon: Gearlean Alf, MD;  [facility];   Service: Orthopedics;  Laterality: Left;  . TOTAL HIP ARTHROPLASTY Right 12/24/2015   Procedure: TOTAL HIP ARTHROPLASTY ANTERIOR APPROACH;  Surgeon: Hessie Knows, MD;  Location: [facility];  Service: Orthopedics;  Laterality: Right;  . TOTAL HIP ARTHROPLASTY Left 02/10/2018   Procedure: TOTAL HIP ARTHROPLASTY ANTERIOR APPROACH;  Surgeon: Hessie Knows, MD;  Location: [facility];  Service: Orthopedics;  Laterality: Left;  . TOTAL KNEE REVISION Left 12/07/2012   Procedure:  TOTAL KNEE ARTHROPLASTY REVISION;  Surgeon: Gearlean Alf, MD;  [facility];  Service: Orthopedics;  Laterality: Left;  . TUBAL LIGATION      There were no vitals filed for this visit.    Pt. reports MD wants PT to focus on hip/LE strengthening. Pt. reports L knee stiffness at this time. No hip pain. MD appt. in 4 weeks (12/18)        There.ex.:   Nustep L6 seat position 1110 min. (cuing to increase L knee flexion/ static holds)  Forward/ backward/ lateral walking with exaggerated hip movement/ cuing for upright posture.Light L UE assist/ heavy R UE assist. 3" plinth step overs forward with L/R in //-bars 10x Walking lunges L/R in //-bars 10x (cuing for posture/ knee position)- mirror feedback Standing TKE with quad control focus/ step ups and downs on L LE in //-bars with UE assist. Supine L hip/knee manual isometrics 10x each  Gait training:   Ambulate in //-bars with focus on heel strike/toe off on R  LE with consistent step pattern length.  Working on R LE stance phase during L LE swing through with decrease UE assist.  Pt. Has difficulty initiating movement on R with wt. Bearing when using L UE on //-bars.  Moderate verbal cuing to L step ups.  Manual tx.:  Seated on blue mat table with contract-relax technique to L knee 10x (moderate resistance) Supine on mat table L hip/ knee manual isometrics 10x Supine L knee flexion 5x with static holds 30 sec. Each. Patellar mobs.All planes of  movement(significant hypomobility).    PT Long Term Goals - 03/07/18 1724      PT LONG TERM GOAL #1   Title  Pt will increase L LE muscle strength 1/2 muscle grade to improve L SLR/ gait pattern without assistive device.      Baseline  Seated L knee A/PROM: flexion 61/66 deg./ extension -33 deg (ext. lag). R/L hip flex. 3+/5 MMT/ 2+/5 MMT. Hip abd. 4/5 MMT, 3/5 MMT. B adduction 5/5 MMT. R/L knee ext. 5/5 MMT, 4/5 MMT. R/L knee flex. 4+/5, 3/5 MMT. Supine R knee A/PROM: extension: -11/-7 deg. flexion: 120 deg. Supine L knee ext. AROM: -32 deg. Pt. unable to actively SLR     Time  4    Period  Weeks    Status  New    Target Date  04/04/18      PT LONG TERM GOAL #2   Title  Pt. will increase L knee flexion AROM to >90 deg. to improve sitting posture/ gait pattern.      Baseline  Seated L knee A/PROM: flexion 61/66 deg./ extension -33 deg (ext. lag).    Time  4    Period  Weeks    Status  New    Target Date  04/04/18      PT LONG TERM GOAL #3   Title  Pt. will increase FOTO score to 56 to improve functional mobility.      Baseline  FOTO: initial 13    Time  4    Period  Weeks    Status  New    Target Date  04/04/18      PT LONG TERM GOAL #4   Title  Pt. able to ambulate with with use of SPC and consistent gait pattern to promote greater independence/ mobility.      Baseline  Moderate L antalgic gait pattern with use of RW    Time  4    Period  Weeks    Status  New    Target Date  04/04/18         Pt. requires repeated verbal cuing to increase R LE step length past L foot with use of RW. Pt. has difficulty decreasing UE support wtih standing ther.ex./ walking tasks. Good quad control/ knee position with walking lunges in //-bars. Pt. able to step up on 6" step with improved control but requires heavy UE assist. Significant ROM limitations/ hypomobility in L knee (flexion/ extension).       Patient will benefit from skilled therapeutic intervention in order to improve the  following deficits and impairments:  Abnormal gait, Decreased activity tolerance, Decreased balance, Decreased endurance, Decreased range of motion, Difficulty walking, Hypomobility, Decreased strength, Pain, Improper body mechanics, Decreased mobility, Decreased scar mobility  Visit Diagnosis: Chronic pain of left knee  Joint stiffness of knee, left  Muscle weakness (generalized)  Gait difficulty     Problem List Patient Active Problem List   Diagnosis Date Noted  .  Status post total hip replacement, left 02/10/2018  . Primary localized osteoarthritis of right hip 12/24/2015  . Arthrofibrosis of knee joint 03/05/2014  . Hypokalemia 05/30/2013  . OA (osteoarthritis) of knee 05/29/2013  . Postoperative stiffness of total knee replacement ([place]) 02/15/2013  . Postoperative anemia due to acute blood loss 12/09/2012  . 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

## 2018-03-28 ENCOUNTER — Ambulatory Visit: Payer: Medicare HMO | Admitting: Physical Therapy

## 2018-03-28 ENCOUNTER — Encounter: Payer: Self-pay | Admitting: Physical Therapy

## 2018-03-28 DIAGNOSIS — M6281 Muscle weakness (generalized): Secondary | ICD-10-CM

## 2018-03-28 DIAGNOSIS — M25562 Pain in left knee: Secondary | ICD-10-CM | POA: Diagnosis not present

## 2018-03-28 DIAGNOSIS — G8929 Other chronic pain: Secondary | ICD-10-CM

## 2018-03-28 DIAGNOSIS — R269 Unspecified abnormalities of gait and mobility: Secondary | ICD-10-CM

## 2018-03-28 DIAGNOSIS — M25662 Stiffness of left knee, not elsewhere classified: Secondary | ICD-10-CM

## 2018-03-28 NOTE — Therapy (Signed)
[facility] [address] [place], [place], [address] Phone: [phone]   Fax:  [phone]  Physical Therapy Treatment  Patient Details  Name: [name] [name] MRN: [id number] Date of Birth: 12/06/1950 Referring Provider (PT): Dr. Rozelle Logan   Encounter Date: 03/28/2018  PT End of Session - 03/28/18 0810    Visit Number  7    Number of Visits  8    Date for PT Re-Evaluation  04/04/18    PT Start Time  0802    PT Stop Time  0901    PT Time Calculation (min)  59 min    Activity Tolerance  Patient tolerated treatment well;Patient limited by pain    Behavior During Therapy  [facility] for tasks assessed/performed       Past Medical History:  Diagnosis Date  . Arthritis   . Complication of anesthesia    terrible vertigo  . Hypertension   . PONV (postoperative nausea and vomiting)    related to vertigo  . Shortness of breath    WITH EXERTION  . Vertigo    hx post op    Past Surgical History:  Procedure Laterality Date  . BACK SURGERY  2010   cervical fusion  . BREAST BIOPSY Left 03/31/2011   fibrocystic changes, marked stromal sclerosis, and dense stromal calcifications  . BREAST BIOPSY Left 03/31/2011  . BREAST BIOPSY Right 03/31/2011   fine needle aspiration with clip placement - ultrasound  . BREAST SURGERY Left 2005   biopsy  . CARPAL TUNNEL RELEASE Right   . FOOT SURGERY Left    heel---STATES METAL IN THE HEEL - FOOT TURNS OUTWARD  . HEEL SPUR SURGERY    . HYSTEROSCOPY W/D&C N/A 07/26/2015   Procedure: DILATATION AND CURETTAGE /HYSTEROSCOPY with cervical biopsy;  Surgeon: Benjaman Kindler, MD;  Location: [facility];  Service: Gynecology;  Laterality: N/A;  . JOINT REPLACEMENT Left 2013   knee  . KNEE ARTHROTOMY Left 05/29/2013   Procedure: LEFT KNEE ARTHROTOMY WITH SCAR EXCISION;  Surgeon: Gearlean Alf, MD;  [facility];  Service: Orthopedics;  Laterality: Left;  . KNEE ARTHROTOMY Left 03/05/2014   Procedure:  LEFT KNEE ARTHROTOMY/SCAR EXCISION/POLYETHYLENE REVISION;  Surgeon: Gearlean Alf, MD;  [facility];  Service: Orthopedics;  Laterality: Left;  . KNEE CLOSED REDUCTION Left 02/15/2013   Procedure: CLOSED MANIPULATION LEFT KNEE;  Surgeon: Gearlean Alf, MD;  [facility];  Service: Orthopedics;  Laterality: Left;  . TOTAL HIP ARTHROPLASTY Right 12/24/2015   Procedure: TOTAL HIP ARTHROPLASTY ANTERIOR APPROACH;  Surgeon: Hessie Knows, MD;  Location: [facility];  Service: Orthopedics;  Laterality: Right;  . TOTAL HIP ARTHROPLASTY Left 02/10/2018   Procedure: TOTAL HIP ARTHROPLASTY ANTERIOR APPROACH;  Surgeon: Hessie Knows, MD;  Location: [facility];  Service: Orthopedics;  Laterality: Left;  . TOTAL KNEE REVISION Left 12/07/2012   Procedure:  TOTAL KNEE ARTHROPLASTY REVISION;  Surgeon: Gearlean Alf, MD;  [facility];  Service: Orthopedics;  Laterality: Left;  . TUBAL LIGATION      There were no vitals filed for this visit.  Subjective Assessment - 03/28/18 0807    Subjective  Pt. received her Nustep for home use.  Pt. states she is stiff in L leg this morning.  Pt. active in church this weekend.      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  Limitations  Walking;House hold activities;Lifting    How long can you stand comfortably?  as soon as standing up    How long can you walk comfortably?  as soon as starts     Diagnostic tests  XRAYS    Patient Stated Goals  Walk without pain/sleep without R hip/knee pain/ Ambulate with no assistive device.      Currently in Pain?  Yes    Pain Score  3     Pain Location  Hip    Pain Orientation  Left    Pain Descriptors / Indicators  Aching    Pain Type  Chronic pain        There.ex.:   Forward/ backward/ lateral walking with exaggerated hip movement/ cuing for upright posture.Light L UE assist/ heavy R UE assist. Resisted gait 2BTB: 5x all  4-planes (UE assist required/ cuing to lighten up UE assist) Resisted lunges L/R in //-bars 10x (cuing for posture/ knee position)- heavy UE assist Supine L hip/knee manual isometrics 10x each Seated 5# ankle wt.: hip flexion/ LAQ/ hip abduction/ toe and heel raises 30x each Nustep L6seat position 1110 min. (cuing to increase L knee flexion/ static holds)  Gait training:   Ambulate in //-bars with focus on heel strike/toe off on R LE with consistent step pattern length. Working on R LE stance phase during L LE swing through with decrease UE assist. Pt. Has difficulty initiating movement on R with wt. Bearing when using L UE on //-bars. Moderate verbal cuing with L step ups at stairs 15x).    Manual tx.:  Supine on mat table L hip/ knee manual isometrics 10x Supine L knee flexion 5x with static holds 30 sec. Each. Patellar mobs.All planes of movement(significant hypomobility).   PT Long Term Goals - 03/07/18 1724      PT LONG TERM GOAL #1   Title  Pt will increase L LE muscle strength 1/2 muscle grade to improve L SLR/ gait pattern without assistive device.      Baseline  Seated L knee A/PROM: flexion 61/66 deg./ extension -33 deg (ext. lag). R/L hip flex. 3+/5 MMT/ 2+/5 MMT. Hip abd. 4/5 MMT, 3/5 MMT. B adduction 5/5 MMT. R/L knee ext. 5/5 MMT, 4/5 MMT. R/L knee flex. 4+/5, 3/5 MMT. Supine R knee A/PROM: extension: -11/-7 deg. flexion: 120 deg. Supine L knee ext. AROM: -32 deg. Pt. unable to actively SLR     Time  4    Period  Weeks    Status  New    Target Date  04/04/18      PT LONG TERM GOAL #2   Title  Pt. will increase L knee flexion AROM to >90 deg. to improve sitting posture/ gait pattern.      Baseline  Seated L knee A/PROM: flexion 61/66 deg./ extension -33 deg (ext. lag).    Time  4    Period  Weeks    Status  New    Target Date  04/04/18      PT LONG TERM GOAL #3   Title  Pt. will increase FOTO score to 56 to improve functional mobility.      Baseline   FOTO: initial 13    Time  4    Period  Weeks    Status  New    Target Date  04/04/18      PT LONG TERM GOAL #4   Title  Pt. able to ambulate with with use of SPC and consistent gait  pattern to promote greater independence/ mobility.      Baseline  Moderate L antalgic gait pattern with use of RW    Time  4    Period  Weeks    Status  New    Target Date  04/04/18         Plan - 03/28/18 0810    Clinical Impression Statement  Supine L knee extension: -24 deg. (after manual stretches).  Improved ability to step up on 6" step with L LE with less UE assist/ time required.  Pt. able to complete more standing resistive ex. to progress L hip strengthening.  Pt. unable to ambulate safely without assistive device.    Clinical Presentation  Evolving    Clinical Decision Making  Moderate    Rehab Potential  Fair    PT Frequency  2x / week    PT Duration  4 weeks    PT Treatment/Interventions  ADLs/Self Care Home Management;Gait training;Functional mobility training;Therapeutic exercise;Balance training;Therapeutic activities;Neuromuscular re-education;Patient/family education;Manual techniques;Passive range of motion;Cryotherapy;Electrical Stimulation;Moist Heat    PT Next Visit Plan  Increase L knee ROM/ LE strengthening in hip/knee.  CHECK GOALS/ SCHEDULE NEXT TX SESSION    PT Home Exercise Plan  reviewed HEP    Consulted and Agree with Plan of Care  Patient       Patient will benefit from skilled therapeutic intervention in order to improve the following deficits and impairments:  Abnormal gait, Decreased activity tolerance, Decreased balance, Decreased endurance, Decreased range of motion, Difficulty walking, Hypomobility, Decreased strength, Pain, Improper body mechanics, Decreased mobility, Decreased scar mobility  Visit Diagnosis: Chronic pain of left knee  Joint stiffness of knee, left  Muscle weakness (generalized)  Gait difficulty     Problem List Patient Active Problem  List   Diagnosis Date Noted  . Status post total hip replacement, left 02/10/2018  . Primary localized osteoarthritis of right hip 12/24/2015  . Arthrofibrosis of knee joint 03/05/2014  . Hypokalemia 05/30/2013  . OA (osteoarthritis) of knee 05/29/2013  . Postoperative stiffness of total knee replacement ([place]) 02/15/2013  . Postoperative anemia due to acute blood loss 12/09/2012  . Failed total knee arthroplasty ([place]) 12/07/2012   Pura Spice, PT, DPT # [phone] 03/28/2018, 12:13 PM  [facility] [address] [place], [place], [address] Phone: [phone]   Fax:  [phone]  Name: [name] [name] MRN: [id number] Date of Birth: 1951/04/17

## 2018-03-30 ENCOUNTER — Encounter: Payer: Medicare HMO | Admitting: Physical Therapy

## 2018-03-30 ENCOUNTER — Ambulatory Visit: Payer: Medicare HMO | Admitting: Physical Therapy

## 2018-03-30 DIAGNOSIS — M25662 Stiffness of left knee, not elsewhere classified: Secondary | ICD-10-CM

## 2018-03-30 DIAGNOSIS — M25562 Pain in left knee: Secondary | ICD-10-CM | POA: Diagnosis not present

## 2018-03-30 DIAGNOSIS — M6281 Muscle weakness (generalized): Secondary | ICD-10-CM

## 2018-03-30 DIAGNOSIS — R269 Unspecified abnormalities of gait and mobility: Secondary | ICD-10-CM

## 2018-03-30 DIAGNOSIS — G8929 Other chronic pain: Secondary | ICD-10-CM

## 2018-04-01 ENCOUNTER — Encounter: Payer: Self-pay | Admitting: Physical Therapy

## 2018-04-01 NOTE — Therapy (Addendum)
[facility] [address] [place], [place], [address] Phone: [phone]   Fax:  [phone]  Physical Therapy Treatment  Patient Details  Name: [name] [name] MRN: [id number] Date of Birth: [birth year] Referring Provider (PT): Dr. Rozelle Logan   Encounter Date: 03/30/2018    Treatment: 8 of 8.  Recert: 79/0/24 0973 to 5329   Past Medical History:  Diagnosis Date  . Arthritis   . Complication of anesthesia    terrible vertigo  . Hypertension   . PONV (postoperative nausea and vomiting)    related to vertigo  . Shortness of breath    WITH EXERTION  . Vertigo    hx post op    Past Surgical History:  Procedure Laterality Date  . BACK SURGERY  2010   cervical fusion  . BREAST BIOPSY Left 03/31/2011   fibrocystic changes, marked stromal sclerosis, and dense stromal calcifications  . BREAST BIOPSY Left 03/31/2011  . BREAST BIOPSY Right 03/31/2011   fine needle aspiration with clip placement - ultrasound  . BREAST SURGERY Left 2005   biopsy  . CARPAL TUNNEL RELEASE Right   . FOOT SURGERY Left    heel---STATES METAL IN THE HEEL - FOOT TURNS OUTWARD  . HEEL SPUR SURGERY    . HYSTEROSCOPY W/D&C N/A 07/26/2015   Procedure: DILATATION AND CURETTAGE /HYSTEROSCOPY with cervical biopsy;  Surgeon: Benjaman Kindler, MD;  Location: [facility];  Service: Gynecology;  Laterality: N/A;  . JOINT REPLACEMENT Left 2013   knee  . KNEE ARTHROTOMY Left 05/29/2013   Procedure: LEFT KNEE ARTHROTOMY WITH SCAR EXCISION;  Surgeon: Gearlean Alf, MD;  [facility]: [facility];  Service: Orthopedics;  Laterality: Left;  . KNEE ARTHROTOMY Left 03/05/2014   Procedure: LEFT KNEE ARTHROTOMY/SCAR EXCISION/POLYETHYLENE REVISION;  Surgeon: Gearlean Alf, MD;  [facility];  Service: Orthopedics;  Laterality: Left;  . KNEE CLOSED REDUCTION Left 02/15/2013   Procedure: CLOSED MANIPULATION LEFT KNEE;  Surgeon: Gearlean Alf, MD;  [facility];  Service:  Orthopedics;  Laterality: Left;  . TOTAL HIP ARTHROPLASTY Right 12/24/2015   Procedure: TOTAL HIP ARTHROPLASTY ANTERIOR APPROACH;  Surgeon: Hessie Knows, MD;  Location: [facility];  Service: Orthopedics;  Laterality: Right;  . TOTAL HIP ARTHROPLASTY Left 02/10/2018   Procedure: TOTAL HIP ARTHROPLASTY ANTERIOR APPROACH;  Surgeon: Hessie Knows, MD;  Location: [facility];  Service: Orthopedics;  Laterality: Left;  . TOTAL KNEE REVISION Left 12/07/2012   Procedure:  TOTAL KNEE ARTHROPLASTY REVISION;  Surgeon: Gearlean Alf, MD;  [facility];  Service: Orthopedics;  Laterality: Left;  . TUBAL LIGATION      There were no vitals filed for this visit.    Pt. reports stiffness in L hip/knee. Pt. reports no pain in L knee but L hip discomfort reported with standing/walking tasks.        There.ex.:   Forward/ backward/ lateral walking with exaggerated hip movement/ cuing for upright posture.Light L UE assist/ heavy R UE assist. 6" step touches L/R (heel/ toe touches).  Step ups/downs 10x2 (cuing for posture) Resisted lunges L/R in //-bars 10x (cuing for posture/ knee position)- heavy UE assist Supine L hip/knee manual isometrics 10x each Seated 5# ankle wt.: hip flexion/ LAQ/ hip abduction/ toe and heel raises 30x each Nustep L6seat position 1110 min. (cuing to increase L knee flexion/ static holds)  Gait training:   Ambulate in //-bars with focus on heel strike/toe off on R LE with consistent step pattern length. Working  on R LE stance phase during L LE swing through with decrease UE assist. Pt. Has difficulty initiating movement on R with wt. Bearing when using L UE on //-bars.Moderate verbal cuing with L step ups at stairs 15x).    Manual tx.:  Supine on mat table L hip/ knee manual isometrics 10x Supine L knee flexion 5x with static holds 30 sec. Each. Patellar mobs.All planes of movement(significant hypomobility).     PT Long Term Goals - 03/07/18 1724       PT LONG TERM GOAL #1   Title  Pt will increase L LE muscle strength 1/2 muscle grade to improve L SLR/ gait pattern without assistive device.      Baseline  Seated L knee A/PROM: flexion 61/66 deg./ extension -33 deg (ext. lag). R/L hip flex. 3+/5 MMT/ 2+/5 MMT. Hip abd. 4/5 MMT, 3/5 MMT. B adduction 5/5 MMT. R/L knee ext. 5/5 MMT, 4/5 MMT. R/L knee flex. 4+/5, 3/5 MMT. Supine R knee A/PROM: extension: -11/-7 deg. flexion: 120 deg. Supine L knee ext. AROM: -32 deg. Pt. unable to actively SLR     Time  4    Period  Weeks    Status  New    Target Date  04/04/18      PT LONG TERM GOAL #2   Title  Pt. will increase L knee flexion AROM to >90 deg. to improve sitting posture/ gait pattern.      Baseline  Seated L knee A/PROM: flexion 61/66 deg./ extension -33 deg (ext. lag).    Time  4    Period  Weeks    Status  New    Target Date  04/04/18      PT LONG TERM GOAL #3   Title  Pt. will increase FOTO score to 56 to improve functional mobility.      Baseline  FOTO: initial 13    Time  4    Period  Weeks    Status  New    Target Date  04/04/18      PT LONG TERM GOAL #4   Title  Pt. able to ambulate with with use of SPC and consistent gait pattern to promote greater independence/ mobility.      Baseline  Moderate L antalgic gait pattern with use of RW    Time  4    Period  Weeks    Status  New    Target Date  04/04/18         Pt. working hard to progress off the RW and complete LE ex. with less UE assist. No increase c/o hip/knee pain during tx. session but heavy use of B UE in //-bars with standing/walking tasks. L knee flexion/extension remains limited with significant hypomobility/ pain with manual tx.       Patient will benefit from skilled therapeutic intervention in order to improve the following deficits and impairments:  Abnormal gait, Decreased activity tolerance, Decreased balance, Decreased endurance, Decreased range of motion, Difficulty walking, Hypomobility, Decreased  strength, Pain, Improper body mechanics, Decreased mobility, Decreased scar mobility  Visit Diagnosis: Chronic pain of left knee  Joint stiffness of knee, left  Muscle weakness (generalized)  Gait difficulty     Problem List Patient Active Problem List   Diagnosis Date Noted  . Status post total hip replacement, left 02/10/2018  . Primary localized osteoarthritis of right hip 12/24/2015  . Arthrofibrosis of knee joint 03/05/2014  . Hypokalemia 05/30/2013  . OA (osteoarthritis) of knee 05/29/2013  .  Postoperative stiffness of total knee replacement ([place]) 02/15/2013  . Postoperative anemia due to acute blood loss 12/09/2012  . Failed total knee arthroplasty ([place]) 12/07/2012   Pura Spice, PT, DPT # [phone] 04/04/2018, 9:25 AM  [facility] [address] [place], [place], [address] Phone: [phone]   Fax:  [phone]  Name: [name] [name] MRN: [id number] Date of Birth: 10-15-1950

## 2018-04-04 ENCOUNTER — Encounter: Payer: Self-pay | Admitting: Physical Therapy

## 2018-04-04 ENCOUNTER — Ambulatory Visit: Payer: Medicare HMO | Attending: Orthopedic Surgery | Admitting: Physical Therapy

## 2018-04-04 DIAGNOSIS — R269 Unspecified abnormalities of gait and mobility: Secondary | ICD-10-CM | POA: Diagnosis present

## 2018-04-04 DIAGNOSIS — M6281 Muscle weakness (generalized): Secondary | ICD-10-CM | POA: Insufficient documentation

## 2018-04-04 DIAGNOSIS — G8929 Other chronic pain: Secondary | ICD-10-CM

## 2018-04-04 DIAGNOSIS — M25562 Pain in left knee: Secondary | ICD-10-CM | POA: Insufficient documentation

## 2018-04-04 DIAGNOSIS — M25662 Stiffness of left knee, not elsewhere classified: Secondary | ICD-10-CM | POA: Diagnosis present

## 2018-04-04 NOTE — Therapy (Signed)
[facility] [address] [place], [place], [address] Phone: [phone]   Fax:  [phone]  Physical Therapy Treatment  Patient Details  Name: AZYA BARBERO MRN: [id number] Date of Birth: 08-24-1950 Referring Provider (PT): Dr. Rozelle Logan   Encounter Date: 04/04/2018  PT End of Session - 04/04/18 0834    Visit Number  9    Number of Visits  17    Date for PT Re-Evaluation  05/02/18    PT Start Time  0817    PT Stop Time  0912    PT Time Calculation (min)  55 min    Activity Tolerance  Patient tolerated treatment well;Patient limited by pain    Behavior During Therapy  [facility] for tasks assessed/performed       Past Medical History:  Diagnosis Date  . Arthritis   . Complication of anesthesia    terrible vertigo  . Hypertension   . PONV (postoperative nausea and vomiting)    related to vertigo  . Shortness of breath    WITH EXERTION  . Vertigo    hx post op    Past Surgical History:  Procedure Laterality Date  . BACK SURGERY  2010   cervical fusion  . BREAST BIOPSY Left 03/31/2011   fibrocystic changes, marked stromal sclerosis, and dense stromal calcifications  . BREAST BIOPSY Left 03/31/2011  . BREAST BIOPSY Right 03/31/2011   fine needle aspiration with clip placement - ultrasound  . BREAST SURGERY Left 2005   biopsy  . CARPAL TUNNEL RELEASE Right   . FOOT SURGERY Left    heel---STATES METAL IN THE HEEL - FOOT TURNS OUTWARD  . HEEL SPUR SURGERY    . HYSTEROSCOPY W/D&C N/A 07/26/2015   Procedure: DILATATION AND CURETTAGE /HYSTEROSCOPY with cervical biopsy;  Surgeon: Benjaman Kindler, MD;  Location: [facility];  Service: Gynecology;  Laterality: N/A;  . JOINT REPLACEMENT Left 2013   knee  . KNEE ARTHROTOMY Left 05/29/2013   Procedure: LEFT KNEE ARTHROTOMY WITH SCAR EXCISION;  Surgeon: Gearlean Alf, MD;  [facility];  Service: Orthopedics;  Laterality: Left;  . KNEE ARTHROTOMY Left 03/05/2014   Procedure:  LEFT KNEE ARTHROTOMY/SCAR EXCISION/POLYETHYLENE REVISION;  Surgeon: Gearlean Alf, MD;  [facility];  Service: Orthopedics;  Laterality: Left;  . KNEE CLOSED REDUCTION Left 02/15/2013   Procedure: CLOSED MANIPULATION LEFT KNEE;  Surgeon: Gearlean Alf, MD;  [facility];  Service: Orthopedics;  Laterality: Left;  . TOTAL HIP ARTHROPLASTY Right 12/24/2015   Procedure: TOTAL HIP ARTHROPLASTY ANTERIOR APPROACH;  Surgeon: Hessie Knows, MD;  Location: [facility];  Service: Orthopedics;  Laterality: Right;  . TOTAL HIP ARTHROPLASTY Left 02/10/2018   Procedure: TOTAL HIP ARTHROPLASTY ANTERIOR APPROACH;  Surgeon: Hessie Knows, MD;  Location: [facility];  Service: Orthopedics;  Laterality: Left;  . TOTAL KNEE REVISION Left 12/07/2012   Procedure:  TOTAL KNEE ARTHROPLASTY REVISION;  Surgeon: Gearlean Alf, MD;  [facility];  Service: Orthopedics;  Laterality: Left;  . TUBAL LIGATION      There were no vitals filed for this visit.  Subjective Assessment - 04/04/18 0817    Subjective  Pt. states she is "really stiff" in L leg this morning.  Pt. has been using Nustep at night with no issues.  Pt. did walk short distance in bathroom with no assistive device.      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      Limitations  Walking;House hold activities;Lifting    How long can you stand comfortably?  as soon as standing up    How long can you walk comfortably?  as soon as starts     Diagnostic tests  XRAYS    Patient Stated Goals  Walk without pain/sleep without R hip/knee pain/ Ambulate with no assistive device.      Currently in Pain?  Yes    Pain Score  4     Pain Location  Hip    Pain Orientation  Left    Pain Descriptors / Indicators  Aching    Pain Type  Chronic pain         [facility] PT Assessment - 04/04/18 0001      Assessment   Medical Diagnosis  R knee pain/ joint stiffness    Referring  Provider (PT)  Dr. Rozelle Logan    Onset Date/Surgical Date  02/10/18    Prior Therapy  yes, known well to PT      Precautions   Precautions  Anterior Hip      Balance Screen   Has the patient fallen in the past 6 months  No      Prior Function   Level of Independence  Requires assistive device for independence      Cognition   Overall Cognitive Status  Within Functional Limits for tasks assessed         There.ex.:   Nustep L6seat position 1110 min. (cuing to increase L knee flexion/ static holds)  Forward/ backward/ lateral walking with exaggerated hip movement/ cuing for upright posture.Light B UE assist. Standing hip ex.: all planes 20x each (cuing for posture) Supine L hip/knee manual isometrics 10x each   Gait training:   Ambulate in //-bars with focus on heel strike/toe off on R LE with consistent step pattern length. Pt. Instructed in use of B loftstand crutches in //-bars and progressing to gym based walking.  Pt. Able to use only R loftstrand crutch with 2-point gait pattern.     Manual tx.:  Supine on mat table L hip/ knee manual isometrics 10x Supine L knee flexion 5x with static holds 30 sec. Each. Patellar mobs.All planes of movement(significant hypomobility).   PT Long Term Goals - 04/04/18 4920      PT LONG TERM GOAL #1   Title  Pt will increase L LE muscle strength 1/2 muscle grade to improve L SLR/ gait pattern without assistive device.      Baseline  R/L hip flex. 3+/5 MMT/ 3+/5 MMT. Hip abd. 4/5 MMT, 4/5 MMT. B adduction 5/5 MMT. R/L knee ext. 5/5 MMT, 4/5 MMT. R/L knee flex. 4+/5, 3/5 MMT. Pt. able to actively SLR 10x (marked improvement)    Time  4    Period  Weeks    Status  Partially Met    Target Date  05/02/18      PT LONG TERM GOAL #2   Title  Pt. will increase L knee flexion AROM to >90 deg. to improve sitting posture/ gait pattern.      Baseline  Seated L knee A/PROM: flexion 61/66 deg./ extension -33 deg (ext. lag).     Time  4    Period  Weeks    Status  Not Met    Target Date  05/02/18      PT LONG TERM GOAL #3   Title  Pt. will increase FOTO  score to 56 to improve functional mobility.      Baseline  FOTO: initial 13    Time  4    Period  Weeks    Status  On-going    Target Date  05/02/18      PT LONG TERM GOAL #4   Title  Pt. able to ambulate with with use of SPC and consistent gait pattern to promote greater independence/ mobility.      Baseline  Moderate L antalgic gait pattern with recent progression to B loftstrand crutches.  Pt. primarily using RW    Time  4    Period  Weeks    Status  Partially Met    Target Date  05/02/18            Plan - 04/04/18 0834    Clinical Impression Statement  Pt. has progressed well to use of B loftstrand crutches in //-bars/ PT gym with 2-point gait pattern.  Pt. able to complete 10 SLR with moderate muscle fatigue/ discomfort in L hip.  Pt. mod. I with bed mobility/ sit to standing transfers and use of loftstrands.  Pt. will try to get some loftstrands prior to next scheduled PT appt.  See updated goals.      Clinical Presentation  Evolving    Clinical Decision Making  Moderate    Rehab Potential  Fair    PT Frequency  2x / week    PT Duration  4 weeks    PT Treatment/Interventions  ADLs/Self Care Home Management;Gait training;Functional mobility training;Therapeutic exercise;Balance training;Therapeutic activities;Neuromuscular re-education;Patient/family education;Manual techniques;Passive range of motion;Cryotherapy;Electrical Stimulation;Moist Heat    PT Next Visit Plan  Increase L knee ROM/ LE strengthening in hip/knee.  CHECK SCHEDULE NEXT TX SESSION    PT Home Exercise Plan  reviewed HEP    Consulted and Agree with Plan of Care  Patient       Patient will benefit from skilled therapeutic intervention in order to improve the following deficits and impairments:  Abnormal gait, Decreased activity tolerance, Decreased balance, Decreased endurance,  Decreased range of motion, Difficulty walking, Hypomobility, Decreased strength, Pain, Improper body mechanics, Decreased mobility, Decreased scar mobility  Visit Diagnosis: Chronic pain of left knee  Joint stiffness of knee, left  Muscle weakness (generalized)  Gait difficulty     Problem List Patient Active Problem List   Diagnosis Date Noted  . Status post total hip replacement, left 02/10/2018  . Primary localized osteoarthritis of right hip 12/24/2015  . Arthrofibrosis of knee joint 03/05/2014  . Hypokalemia 05/30/2013  . OA (osteoarthritis) of knee 05/29/2013  . Postoperative stiffness of total knee replacement ([place]) 02/15/2013  . Postoperative anemia due to acute blood loss 12/09/2012  . 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 [place], [place], [address] Phone: [phone]   Fax:  [phone]  Name: ZALAYA ASTARITA MRN: [id number] Date of Birth: 08/26/1950

## 2018-04-07 ENCOUNTER — Ambulatory Visit: Payer: Medicare HMO | Admitting: Physical Therapy

## 2018-04-07 DIAGNOSIS — M25562 Pain in left knee: Secondary | ICD-10-CM | POA: Diagnosis not present

## 2018-04-07 DIAGNOSIS — M6281 Muscle weakness (generalized): Secondary | ICD-10-CM

## 2018-04-07 DIAGNOSIS — G8929 Other chronic pain: Secondary | ICD-10-CM

## 2018-04-07 DIAGNOSIS — M25662 Stiffness of left knee, not elsewhere classified: Secondary | ICD-10-CM

## 2018-04-07 DIAGNOSIS — R269 Unspecified abnormalities of gait and mobility: Secondary | ICD-10-CM

## 2018-04-09 ENCOUNTER — Encounter: Payer: Self-pay | Admitting: Physical Therapy

## 2018-04-09 NOTE — Therapy (Signed)
[facility] [address] [place], [place], [address] Phone: [phone]   Fax:  [phone]  Physical Therapy Treatment  Patient Details  Name: [name] [name] MRN: [id number] Date of Birth: Feb 26, 1951 Referring Provider (PT): Dr. Rozelle Logan   Encounter Date: 04/07/2018  PT End of Session - 04/09/18 1335    Visit Number  10    Number of Visits  17    Date for PT Re-Evaluation  05/02/18    PT Start Time  0811    PT Stop Time  0902    PT Time Calculation (min)  51 min    Activity Tolerance  Patient tolerated treatment well;Patient limited by pain    Behavior During Therapy  [facility] for tasks assessed/performed       Past Medical History:  Diagnosis Date  . Arthritis   . Complication of anesthesia    terrible vertigo  . Hypertension   . PONV (postoperative nausea and vomiting)    related to vertigo  . Shortness of breath    WITH EXERTION  . Vertigo    hx post op    Past Surgical History:  Procedure Laterality Date  . BACK SURGERY  2010   cervical fusion  . BREAST BIOPSY Left 03/31/2011   fibrocystic changes, marked stromal sclerosis, and dense stromal calcifications  . BREAST BIOPSY Left 03/31/2011  . BREAST BIOPSY Right 03/31/2011   fine needle aspiration with clip placement - ultrasound  . BREAST SURGERY Left 2005   biopsy  . CARPAL TUNNEL RELEASE Right   . FOOT SURGERY Left    heel---STATES METAL IN THE HEEL - FOOT TURNS OUTWARD  . HEEL SPUR SURGERY    . HYSTEROSCOPY W/D&C N/A 07/26/2015   Procedure: DILATATION AND CURETTAGE /HYSTEROSCOPY with cervical biopsy;  Surgeon: Benjaman Kindler, MD;  Location: [facility];  Service: Gynecology;  Laterality: N/A;  . JOINT REPLACEMENT Left 2013   knee  . KNEE ARTHROTOMY Left 05/29/2013   Procedure: LEFT KNEE ARTHROTOMY WITH SCAR EXCISION;  Surgeon: Gearlean Alf, MD;  [facility];  Service: Orthopedics;  Laterality: Left;  . KNEE ARTHROTOMY Left 03/05/2014   Procedure:  LEFT KNEE ARTHROTOMY/SCAR EXCISION/POLYETHYLENE REVISION;  Surgeon: Gearlean Alf, MD;  [facility];  Service: Orthopedics;  Laterality: Left;  . KNEE CLOSED REDUCTION Left 02/15/2013   Procedure: CLOSED MANIPULATION LEFT KNEE;  Surgeon: Gearlean Alf, MD;  [facility];  Service: Orthopedics;  Laterality: Left;  . TOTAL HIP ARTHROPLASTY Right 12/24/2015   Procedure: TOTAL HIP ARTHROPLASTY ANTERIOR APPROACH;  Surgeon: Hessie Knows, MD;  Location: [facility];  Service: Orthopedics;  Laterality: Right;  . TOTAL HIP ARTHROPLASTY Left 02/10/2018   Procedure: TOTAL HIP ARTHROPLASTY ANTERIOR APPROACH;  Surgeon: Hessie Knows, MD;  Location: [facility];  Service: Orthopedics;  Laterality: Left;  . TOTAL KNEE REVISION Left 12/07/2012   Procedure:  TOTAL KNEE ARTHROPLASTY REVISION;  Surgeon: Gearlean Alf, MD;  [facility];  Service: Orthopedics;  Laterality: Left;  . TUBAL LIGATION      There were no vitals filed for this visit.  Subjective Assessment - 04/09/18 1324    Subjective  Stiffness in L leg remains a consistent issue.  Pt. reports groin pain with walking/ getting into car.      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      Limitations  Walking;House hold  activities;Lifting    How long can you stand comfortably?  as soon as standing up    How long can you walk comfortably?  as soon as starts     Diagnostic tests  XRAYS    Patient Stated Goals  Walk without pain/sleep without R hip/knee pain/ Ambulate with no assistive device.      Currently in Pain?  Yes    Pain Score  3     Pain Location  Hip    Pain Orientation  Left    Pain Descriptors / Indicators  Aching            There.ex.:   Nustep L6seat position 1110 min. (cuing to increase L knee flexion/ static holds)  Forward/ backward/ lateral walking with exaggerated hip movement/ cuing for upright posture.Light B UE assist.   High marching with mirror feedback to correct posture. Standing hip ex.: all planes 20x each (cuing for posture) Supine L hip/knee manual isometrics 10x each 12" step touches/ lunges with handrail support   Gait training:   Ambulate in //-bars with focus on heel strike/toe off on R LE with consistent step pattern length. Pt. Instructed in use of B loftstand crutches in //-bars and progressing to gym based walking.  Pt. Able to use only R loftstrand crutch with 2-point gait pattern.     Manual tx.:  Supine on mat table L hip/ knee manual isometrics 10x Supine L knee flexion 5x with static holds 30 sec. Each. Patellar mobs.All planes of movement(significant hypomobility).    PT Long Term Goals - 04/04/18 1657      PT LONG TERM GOAL #1   Title  Pt will increase L LE muscle strength 1/2 muscle grade to improve L SLR/ gait pattern without assistive device.      Baseline  R/L hip flex. 3+/5 MMT/ 3+/5 MMT. Hip abd. 4/5 MMT, 4/5 MMT. B adduction 5/5 MMT. R/L knee ext. 5/5 MMT, 4/5 MMT. R/L knee flex. 4+/5, 3/5 MMT. Pt. able to actively SLR 10x (marked improvement)    Time  4    Period  Weeks    Status  Partially Met    Target Date  05/02/18      PT LONG TERM GOAL #2   Title  Pt. will increase L knee flexion AROM to >90 deg. to improve sitting posture/ gait pattern.      Baseline  Seated L knee A/PROM: flexion 61/66 deg./ extension -33 deg (ext. lag).    Time  4    Period  Weeks    Status  Not Met    Target Date  05/02/18      PT LONG TERM GOAL #3   Title  Pt. will increase FOTO score to 56 to improve functional mobility.      Baseline  FOTO: initial 13    Time  4    Period  Weeks    Status  On-going    Target Date  05/02/18      PT LONG TERM GOAL #4   Title  Pt. able to ambulate with with use of SPC and consistent gait pattern to promote greater independence/ mobility.      Baseline  Moderate L antalgic gait pattern with recent progression to B loftstrand crutches.   Pt. primarily using RW    Time  4    Period  Weeks    Status  Partially Met    Target Date  05/02/18  Plan - 04/09/18 1336    Clinical Impression Statement  Significant L knee joint stiffness/ hip muscle weakness remains primary limiting factor in order to progress off walking.  Pt. demonstrates proper use of B loftstrand crutches and 2-point gait pattern.  Pt. will work on progressing to loftstrand crutches over the weekend.  PT issued a signed MD order requesting use of loftstrand crutches.  No increase c/o groin pain after standing ther.ex./ marching.  Pt. will increase resistance/ time on Nustep over weekend.      Clinical Presentation  Evolving    Clinical Decision Making  Moderate    Rehab Potential  Fair    PT Frequency  2x / week    PT Duration  4 weeks    PT Treatment/Interventions  ADLs/Self Care Home Management;Gait training;Functional mobility training;Therapeutic exercise;Balance training;Therapeutic activities;Neuromuscular re-education;Patient/family education;Manual techniques;Passive range of motion;Cryotherapy;Electrical Stimulation;Moist Heat    PT Next Visit Plan  Increase L knee ROM/ LE strengthening in hip/knee.  CHECK SCHEDULE NEXT TX SESSION    PT Home Exercise Plan  reviewed HEP    Consulted and Agree with Plan of Care  Patient       Patient will benefit from skilled therapeutic intervention in order to improve the following deficits and impairments:  Abnormal gait, Decreased activity tolerance, Decreased balance, Decreased endurance, Decreased range of motion, Difficulty walking, Hypomobility, Decreased strength, Pain, Improper body mechanics, Decreased mobility, Decreased scar mobility  Visit Diagnosis: Chronic pain of left knee  Joint stiffness of knee, left  Muscle weakness (generalized)  Gait difficulty     Problem List Patient Active Problem List   Diagnosis Date Noted  . Status post total hip replacement, left 02/10/2018  .  Primary localized osteoarthritis of right hip 12/24/2015  . Arthrofibrosis of knee joint 03/05/2014  . Hypokalemia 05/30/2013  . OA (osteoarthritis) of knee 05/29/2013  . Postoperative stiffness of total knee replacement ([place]) 02/15/2013  . Postoperative anemia due to acute blood loss 12/09/2012  . Failed total knee arthroplasty ([place]) 12/07/2012   Pura Spice, PT, DPT # [phone] 04/09/2018, 1:45 PM  [facility] [address] [place], [place], [address] Phone: [phone]   Fax:  [phone]  Name: [name] [name] MRN: [id number] Date of Birth: [birth year]

## 2018-04-11 ENCOUNTER — Encounter: Payer: Self-pay | Admitting: Physical Therapy

## 2018-04-11 ENCOUNTER — Ambulatory Visit: Payer: Medicare HMO | Admitting: Physical Therapy

## 2018-04-11 DIAGNOSIS — M25662 Stiffness of left knee, not elsewhere classified: Secondary | ICD-10-CM

## 2018-04-11 DIAGNOSIS — R269 Unspecified abnormalities of gait and mobility: Secondary | ICD-10-CM

## 2018-04-11 DIAGNOSIS — M25562 Pain in left knee: Principal | ICD-10-CM

## 2018-04-11 DIAGNOSIS — M6281 Muscle weakness (generalized): Secondary | ICD-10-CM

## 2018-04-11 DIAGNOSIS — G8929 Other chronic pain: Secondary | ICD-10-CM

## 2018-04-11 NOTE — Therapy (Signed)
[facility] [address] [place], [place], [address] Phone: [phone]   Fax:  [phone]  Physical Therapy Treatment  Patient Details  Name: [name] [name] MRN: [id number] Date of Birth: 1951/03/03 Referring Provider (PT): Dr. Rozelle Logan   Encounter Date: 04/11/2018  PT End of Session - 04/11/18 0711    Visit Number  11    Number of Visits  17    Date for PT Re-Evaluation  05/02/18    PT Start Time  0816    PT Stop Time  0906    PT Time Calculation (min)  50 min    Activity Tolerance  Patient tolerated treatment well;Patient limited by pain    Behavior During Therapy  [facility] for tasks assessed/performed       Past Medical History:  Diagnosis Date  . Arthritis   . Complication of anesthesia    terrible vertigo  . Hypertension   . PONV (postoperative nausea and vomiting)    related to vertigo  . Shortness of breath    WITH EXERTION  . Vertigo    hx post op    Past Surgical History:  Procedure Laterality Date  . BACK SURGERY  2010   cervical fusion  . BREAST BIOPSY Left 03/31/2011   fibrocystic changes, marked stromal sclerosis, and dense stromal calcifications  . BREAST BIOPSY Left 03/31/2011  . BREAST BIOPSY Right 03/31/2011   fine needle aspiration with clip placement - ultrasound  . BREAST SURGERY Left 2005   biopsy  . CARPAL TUNNEL RELEASE Right   . FOOT SURGERY Left    heel---STATES METAL IN THE HEEL - FOOT TURNS OUTWARD  . HEEL SPUR SURGERY    . HYSTEROSCOPY W/D&C N/A 07/26/2015   Procedure: DILATATION AND CURETTAGE /HYSTEROSCOPY with cervical biopsy;  Surgeon: Benjaman Kindler, MD;  Location: [facility];  Service: Gynecology;  Laterality: N/A;  . JOINT REPLACEMENT Left 2013   knee  . KNEE ARTHROTOMY Left 05/29/2013   Procedure: LEFT KNEE ARTHROTOMY WITH SCAR EXCISION;  Surgeon: Gearlean Alf, MD;  [facility]: [facility];  Service: Orthopedics;  Laterality: Left;  . KNEE ARTHROTOMY Left 03/05/2014   Procedure:  LEFT KNEE ARTHROTOMY/SCAR EXCISION/POLYETHYLENE REVISION;  Surgeon: Gearlean Alf, MD;  [facility];  Service: Orthopedics;  Laterality: Left;  . KNEE CLOSED REDUCTION Left 02/15/2013   Procedure: CLOSED MANIPULATION LEFT KNEE;  Surgeon: Gearlean Alf, MD;  [facility];  Service: Orthopedics;  Laterality: Left;  . TOTAL HIP ARTHROPLASTY Right 12/24/2015   Procedure: TOTAL HIP ARTHROPLASTY ANTERIOR APPROACH;  Surgeon: Hessie Knows, MD;  Location: [facility];  Service: Orthopedics;  Laterality: Right;  . TOTAL HIP ARTHROPLASTY Left 02/10/2018   Procedure: TOTAL HIP ARTHROPLASTY ANTERIOR APPROACH;  Surgeon: Hessie Knows, MD;  Location: [facility];  Service: Orthopedics;  Laterality: Left;  . TOTAL KNEE REVISION Left 12/07/2012   Procedure:  TOTAL KNEE ARTHROPLASTY REVISION;  Surgeon: Gearlean Alf, MD;  [facility];  Service: Orthopedics;  Laterality: Left;  . TUBAL LIGATION      There were no vitals filed for this visit.  Subjective Assessment - 04/11/18 0711    Subjective  Pt. states she is really stiff in L leg.  Pt. ordered Loftstrand crutches and they will arrive Wednesday.  Pt. using RW upon entering PT clinic today.      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      Limitations  Walking;House hold activities;Lifting    How long can you stand comfortably?  as soon as standing up    How long can you walk comfortably?  as soon as starts     Patient Stated Goals  Walk without pain/sleep without R hip/knee pain/ Ambulate with no assistive device.      Currently in Pain?  No/denies        Treatment:  There.ex.:   Forward/ backward/ lateral walking with exaggerated hip movement/ cuing for upright posture.Light BUE assist.  High marching with mirror feedback to correct posture.  3" step ups/ overs with B //-bars support with UE progressing to R UE only 5x each direction.   Resisted gait  2BTB 10x all 4-planes with B UE assist.   Supine L hip/knee manual isometrics 10x each 12" step touches/ lunges with handrail support  Nustep L6seat position 1110 min. (cuing to increase L knee flexion/ static holds)   Manual tx.:  Supine on mat table L hip/ knee manual isometrics 10x Supine L knee flexion 5x with static holds 30 sec. Each. Patellar mobs.All planes of movement(significant hypomobility).    PT Long Term Goals - 04/04/18 8295      PT LONG TERM GOAL #1   Title  Pt will increase L LE muscle strength 1/2 muscle grade to improve L SLR/ gait pattern without assistive device.      Baseline  R/L hip flex. 3+/5 MMT/ 3+/5 MMT. Hip abd. 4/5 MMT, 4/5 MMT. B adduction 5/5 MMT. R/L knee ext. 5/5 MMT, 4/5 MMT. R/L knee flex. 4+/5, 3/5 MMT. Pt. able to actively SLR 10x (marked improvement)    Time  4    Period  Weeks    Status  Partially Met    Target Date  05/02/18      PT LONG TERM GOAL #2   Title  Pt. will increase L knee flexion AROM to >90 deg. to improve sitting posture/ gait pattern.      Baseline  Seated L knee A/PROM: flexion 61/66 deg./ extension -33 deg (ext. lag).    Time  4    Period  Weeks    Status  Not Met    Target Date  05/02/18      PT LONG TERM GOAL #3   Title  Pt. will increase FOTO score to 56 to improve functional mobility.      Baseline  FOTO: initial 13    Time  4    Period  Weeks    Status  On-going    Target Date  05/02/18      PT LONG TERM GOAL #4   Title  Pt. able to ambulate with with use of SPC and consistent gait pattern to promote greater independence/ mobility.      Baseline  Moderate L antalgic gait pattern with recent progression to B loftstrand crutches.  Pt. primarily using RW    Time  4    Period  Weeks    Status  Partially Met    Target Date  05/02/18         Plan - 04/11/18 0711    Clinical Impression Statement  Slight increase in L groin discomfort with lateral walking.  Pt. progressing well to use of 1 UE  with walking/ balance tasks in //-bars during step pattern.  No LOB during tx. session.  Pt. working hard with hamstinrg/gastroc flexibility and knee ROM during ther.ex./ manual tx.  Clinical Presentation  Evolving    Clinical Decision Making  Moderate    Rehab Potential  Fair    PT Frequency  2x / week    PT Duration  4 weeks    PT Treatment/Interventions  ADLs/Self Care Home Management;Gait training;Functional mobility training;Therapeutic exercise;Balance training;Therapeutic activities;Neuromuscular re-education;Patient/family education;Manual techniques;Passive range of motion;Cryotherapy;Electrical Stimulation;Moist Heat    PT Next Visit Plan  Increase L knee ROM/ LE strengthening in hip/knee.      PT Home Exercise Plan  reviewed HEP       Patient will benefit from skilled therapeutic intervention in order to improve the following deficits and impairments:  Abnormal gait, Decreased activity tolerance, Decreased balance, Decreased endurance, Decreased range of motion, Difficulty walking, Hypomobility, Decreased strength, Pain, Improper body mechanics, Decreased mobility, Decreased scar mobility  Visit Diagnosis: Chronic pain of left knee  Joint stiffness of knee, left  Muscle weakness (generalized)  Gait difficulty     Problem List Patient Active Problem List   Diagnosis Date Noted  . Status post total hip replacement, left 02/10/2018  . Primary localized osteoarthritis of right hip 12/24/2015  . Arthrofibrosis of knee joint 03/05/2014  . Hypokalemia 05/30/2013  . OA (osteoarthritis) of knee 05/29/2013  . Postoperative stiffness of total knee replacement ([place]) 02/15/2013  . Postoperative anemia due to acute blood loss 12/09/2012  . 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

## 2018-04-14 ENCOUNTER — Ambulatory Visit: Payer: Medicare HMO | Admitting: Physical Therapy

## 2018-04-14 ENCOUNTER — Encounter: Payer: Self-pay | Admitting: Physical Therapy

## 2018-04-14 DIAGNOSIS — M25562 Pain in left knee: Principal | ICD-10-CM

## 2018-04-14 DIAGNOSIS — G8929 Other chronic pain: Secondary | ICD-10-CM

## 2018-04-14 DIAGNOSIS — M6281 Muscle weakness (generalized): Secondary | ICD-10-CM

## 2018-04-14 DIAGNOSIS — M25662 Stiffness of left knee, not elsewhere classified: Secondary | ICD-10-CM

## 2018-04-14 DIAGNOSIS — R269 Unspecified abnormalities of gait and mobility: Secondary | ICD-10-CM

## 2018-04-14 NOTE — Therapy (Signed)
[facility] [address] [place], [place], [address] Phone: [phone]   Fax:  [phone]  Physical Therapy Treatment  Patient Details  Name: [name] [name] MRN: [id number] Date of Birth: 1951/01/05 Referring Provider (PT): Dr. Rozelle Logan   Encounter Date: 04/14/2018  PT End of Session - 04/14/18 0813    Visit Number  12    Number of Visits  17    Date for PT Re-Evaluation  05/02/18    PT Start Time  0810    PT Stop Time  0901    PT Time Calculation (min)  51 min    Activity Tolerance  Patient tolerated treatment well;Patient limited by pain    Behavior During Therapy  [facility] for tasks assessed/performed       Past Medical History:  Diagnosis Date  . Arthritis   . Complication of anesthesia    terrible vertigo  . Hypertension   . PONV (postoperative nausea and vomiting)    related to vertigo  . Shortness of breath    WITH EXERTION  . Vertigo    hx post op    Past Surgical History:  Procedure Laterality Date  . BACK SURGERY  2010   cervical fusion  . BREAST BIOPSY Left 03/31/2011   fibrocystic changes, marked stromal sclerosis, and dense stromal calcifications  . BREAST BIOPSY Left 03/31/2011  . BREAST BIOPSY Right 03/31/2011   fine needle aspiration with clip placement - ultrasound  . BREAST SURGERY Left 2005   biopsy  . CARPAL TUNNEL RELEASE Right   . FOOT SURGERY Left    heel---STATES METAL IN THE HEEL - FOOT TURNS OUTWARD  . HEEL SPUR SURGERY    . HYSTEROSCOPY W/D&C N/A 07/26/2015   Procedure: DILATATION AND CURETTAGE /HYSTEROSCOPY with cervical biopsy;  Surgeon: Benjaman Kindler, MD;  Location: [facility];  Service: Gynecology;  Laterality: N/A;  . JOINT REPLACEMENT Left 2013   knee  . KNEE ARTHROTOMY Left 05/29/2013   Procedure: LEFT KNEE ARTHROTOMY WITH SCAR EXCISION;  Surgeon: Gearlean Alf, MD;  [facility];  Service: Orthopedics;  Laterality: Left;  . KNEE ARTHROTOMY Left 03/05/2014   Procedure:  LEFT KNEE ARTHROTOMY/SCAR EXCISION/POLYETHYLENE REVISION;  Surgeon: Gearlean Alf, MD;  [facility];  Service: Orthopedics;  Laterality: Left;  . KNEE CLOSED REDUCTION Left 02/15/2013   Procedure: CLOSED MANIPULATION LEFT KNEE;  Surgeon: Gearlean Alf, MD;  [facility];  Service: Orthopedics;  Laterality: Left;  . TOTAL HIP ARTHROPLASTY Right 12/24/2015   Procedure: TOTAL HIP ARTHROPLASTY ANTERIOR APPROACH;  Surgeon: Hessie Knows, MD;  Location: [facility];  Service: Orthopedics;  Laterality: Right;  . TOTAL HIP ARTHROPLASTY Left 02/10/2018   Procedure: TOTAL HIP ARTHROPLASTY ANTERIOR APPROACH;  Surgeon: Hessie Knows, MD;  Location: [facility];  Service: Orthopedics;  Laterality: Left;  . TOTAL KNEE REVISION Left 12/07/2012   Procedure:  TOTAL KNEE ARTHROPLASTY REVISION;  Surgeon: Gearlean Alf, MD;  [facility];  Service: Orthopedics;  Laterality: Left;  . TUBAL LIGATION      There were no vitals filed for this visit.  Subjective Assessment - 04/14/18 0811    Subjective  Pt. reports stiffness in L LE and discomfort in L anterior hip.  Pt. states she has not been too active this morning and needs to loosen up this morning.      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      Limitations  Walking;House hold activities;Lifting    How long can you stand comfortably?  as soon as standing up    How long can you walk comfortably?  as soon as starts     Diagnostic tests  XRAYS    Patient Stated Goals  Walk without pain/sleep without R hip/knee pain/ Ambulate with no assistive device.      Currently in Pain?  Yes    Pain Score  2     Pain Location  Hip    Pain Orientation  Left    Pain Descriptors / Indicators  Aching        MD appt. Next Wednesday (12/18).      Treatment:  There.ex.:   Forward/ backward/ lateral walking with exaggerated hip movement/ cuing for upright posture.Light  BUE assist.High marching with mirror feedback to correct posture.  3" step ups/ overs with B //-bars support with UE progressing to R UE only 5x each direction.   TG knee flexion with >20 sec. holds  Step ups with UE assist at stairs.  6"/ 12" step touches/ lunges with handrail support  Nustep L6seat position 1110 min. (cuing to increase L knee flexion/ static holds)   Manual tx.:  Seated L knee extension/ flexion stretches/ contract-relax technique.  Patellar mobs.All planes of movement(significant hypomobility).     PT Long Term Goals - 04/04/18 0867      PT LONG TERM GOAL #1   Title  Pt will increase L LE muscle strength 1/2 muscle grade to improve L SLR/ gait pattern without assistive device.      Baseline  R/L hip flex. 3+/5 MMT/ 3+/5 MMT. Hip abd. 4/5 MMT, 4/5 MMT. B adduction 5/5 MMT. R/L knee ext. 5/5 MMT, 4/5 MMT. R/L knee flex. 4+/5, 3/5 MMT. Pt. able to actively SLR 10x (marked improvement)    Time  4    Period  Weeks    Status  Partially Met    Target Date  05/02/18      PT LONG TERM GOAL #2   Title  Pt. will increase L knee flexion AROM to >90 deg. to improve sitting posture/ gait pattern.      Baseline  Seated L knee A/PROM: flexion 61/66 deg./ extension -33 deg (ext. lag).    Time  4    Period  Weeks    Status  Not Met    Target Date  05/02/18      PT LONG TERM GOAL #3   Title  Pt. will increase FOTO score to 56 to improve functional mobility.      Baseline  FOTO: initial 13    Time  4    Period  Weeks    Status  On-going    Target Date  05/02/18      PT LONG TERM GOAL #4   Title  Pt. able to ambulate with with use of SPC and consistent gait pattern to promote greater independence/ mobility.      Baseline  Moderate L antalgic gait pattern with recent progression to B loftstrand crutches.  Pt. primarily using RW    Time  4    Period  Weeks    Status  Partially Met    Target Date  05/02/18          Plan - 04/14/18 0813    Clinical  Impression Statement  Pt. continues to show slow but consistent progress with hip strengthening.  Pts. L  knee flexion/ extension remains with significant hypomobility.  Pts. carryover with L knee ROM is limited from start to end of PT tx. session.  Pt. pushes herself and works hard during standing hip/ LE ex. program.  Pt. has not received loftstrand crutches at this time.       Clinical Presentation  Evolving    Clinical Decision Making  Moderate    Rehab Potential  Fair    PT Frequency  2x / week    PT Duration  4 weeks    PT Treatment/Interventions  ADLs/Self Care Home Management;Gait training;Functional mobility training;Therapeutic exercise;Balance training;Therapeutic activities;Neuromuscular re-education;Patient/family education;Manual techniques;Passive range of motion;Cryotherapy;Electrical Stimulation;Moist Heat    PT Next Visit Plan  Increase L knee ROM/ LE strengthening in hip/knee.      PT Home Exercise Plan  reviewed HEP    Consulted and Agree with Plan of Care  Patient       Patient will benefit from skilled therapeutic intervention in order to improve the following deficits and impairments:  Abnormal gait, Decreased activity tolerance, Decreased balance, Decreased endurance, Decreased range of motion, Difficulty walking, Hypomobility, Decreased strength, Pain, Improper body mechanics, Decreased mobility, Decreased scar mobility  Visit Diagnosis: Chronic pain of left knee  Joint stiffness of knee, left  Muscle weakness (generalized)  Gait difficulty     Problem List Patient Active Problem List   Diagnosis Date Noted  . Status post total hip replacement, left 02/10/2018  . Primary localized osteoarthritis of right hip 12/24/2015  . Arthrofibrosis of knee joint 03/05/2014  . Hypokalemia 05/30/2013  . OA (osteoarthritis) of knee 05/29/2013  . Postoperative stiffness of total knee replacement ([place]) 02/15/2013  . Postoperative anemia due to acute blood loss 12/09/2012   . 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

## 2018-04-18 ENCOUNTER — Encounter: Payer: Self-pay | Admitting: Physical Therapy

## 2018-04-18 ENCOUNTER — Ambulatory Visit: Payer: Medicare HMO | Admitting: Physical Therapy

## 2018-04-18 DIAGNOSIS — M25562 Pain in left knee: Principal | ICD-10-CM

## 2018-04-18 DIAGNOSIS — M6281 Muscle weakness (generalized): Secondary | ICD-10-CM

## 2018-04-18 DIAGNOSIS — R269 Unspecified abnormalities of gait and mobility: Secondary | ICD-10-CM

## 2018-04-18 DIAGNOSIS — M25662 Stiffness of left knee, not elsewhere classified: Secondary | ICD-10-CM

## 2018-04-18 DIAGNOSIS — G8929 Other chronic pain: Secondary | ICD-10-CM

## 2018-04-18 NOTE — Therapy (Signed)
[facility] [address] [place], [place], [address] Phone: [phone]   Fax:  [phone]  Physical Therapy Treatment  Patient Details  Name: [name] [name] MRN: [id number] Date of Birth: 1950-06-21 Referring Provider (PT): Dr. Rozelle Logan   Encounter Date: 04/18/2018  PT End of Session - 04/18/18 0824    Visit Number  13    Number of Visits  17    Date for PT Re-Evaluation  05/02/18    PT Start Time  0816    PT Stop Time  0907    PT Time Calculation (min)  51 min    Activity Tolerance  Patient tolerated treatment well;Patient limited by pain    Behavior During Therapy  [facility] for tasks assessed/performed       Past Medical History:  Diagnosis Date  . Arthritis   . Complication of anesthesia    terrible vertigo  . Hypertension   . PONV (postoperative nausea and vomiting)    related to vertigo  . Shortness of breath    WITH EXERTION  . Vertigo    hx post op    Past Surgical History:  Procedure Laterality Date  . BACK SURGERY  2010   cervical fusion  . BREAST BIOPSY Left 03/31/2011   fibrocystic changes, marked stromal sclerosis, and dense stromal calcifications  . BREAST BIOPSY Left 03/31/2011  . BREAST BIOPSY Right 03/31/2011   fine needle aspiration with clip placement - ultrasound  . BREAST SURGERY Left 2005   biopsy  . CARPAL TUNNEL RELEASE Right   . FOOT SURGERY Left    heel---STATES METAL IN THE HEEL - FOOT TURNS OUTWARD  . HEEL SPUR SURGERY    . HYSTEROSCOPY W/D&C N/A 07/26/2015   Procedure: DILATATION AND CURETTAGE /HYSTEROSCOPY with cervical biopsy;  Surgeon: Benjaman Kindler, MD;  Location: [facility];  Service: Gynecology;  Laterality: N/A;  . JOINT REPLACEMENT Left 2013   knee  . KNEE ARTHROTOMY Left 05/29/2013   Procedure: LEFT KNEE ARTHROTOMY WITH SCAR EXCISION;  Surgeon: Gearlean Alf, MD;  [facility];  Service: Orthopedics;  Laterality: Left;  . KNEE ARTHROTOMY Left 03/05/2014   Procedure:  LEFT KNEE ARTHROTOMY/SCAR EXCISION/POLYETHYLENE REVISION;  Surgeon: Gearlean Alf, MD;  [facility];  Service: Orthopedics;  Laterality: Left;  . KNEE CLOSED REDUCTION Left 02/15/2013   Procedure: CLOSED MANIPULATION LEFT KNEE;  Surgeon: Gearlean Alf, MD;  [facility];  Service: Orthopedics;  Laterality: Left;  . TOTAL HIP ARTHROPLASTY Right 12/24/2015   Procedure: TOTAL HIP ARTHROPLASTY ANTERIOR APPROACH;  Surgeon: Hessie Knows, MD;  Location: [facility];  Service: Orthopedics;  Laterality: Right;  . TOTAL HIP ARTHROPLASTY Left 02/10/2018   Procedure: TOTAL HIP ARTHROPLASTY ANTERIOR APPROACH;  Surgeon: Hessie Knows, MD;  Location: [facility];  Service: Orthopedics;  Laterality: Left;  . TOTAL KNEE REVISION Left 12/07/2012   Procedure:  TOTAL KNEE ARTHROPLASTY REVISION;  Surgeon: Gearlean Alf, MD;  [facility];  Service: Orthopedics;  Laterality: Left;  . TUBAL LIGATION      There were no vitals filed for this visit.  Subjective Assessment - 04/18/18 0822    Subjective  Pt. states she is still not sleeping well at night due to knee pain/stiffness.  Pt. entered PT with use of RW and difficulty with L heel strike.      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      Limitations  Walking;House hold activities;Lifting    How long can you stand comfortably?  as soon as standing up    How long can you walk comfortably?  as soon as starts     Diagnostic tests  XRAYS    Patient Stated Goals  Walk without pain/sleep without R hip/knee pain/ Ambulate with no assistive device.      Currently in Pain?  Yes    Pain Score  6     Pain Location  Knee    Pain Orientation  Right    Pain Descriptors / Indicators  Aching    Pain Type  Chronic pain           Treatment:  There.ex.:   Nustep L6seat position 1110 min. (warm-up/no charge) 3" plinth step overs in //-bars with B UE support  8x. Forward/ backward/ lateral walking with exaggerated hip movement/ cuing for upright posture.Light BUE assist.High marching with mirror feedback to correct posture. TG knee flexion with >20 sec. holds Step ups with UE assist at stairs.  Walking/ standing with use of Loftstrand crutches in PT clinic from blue mat table.    Manual tx.:  Seated L knee extension/ flexion stretches/ contract-relax technique at edge of blue mat table.  Patellar mobs.All planes of movement(significant hypomobility). Deep STM to L distal quad during knee flexion position.       PT Long Term Goals - 04/04/18 6734      PT LONG TERM GOAL #1   Title  Pt will increase L LE muscle strength 1/2 muscle grade to improve L SLR/ gait pattern without assistive device.      Baseline  R/L hip flex. 3+/5 MMT/ 3+/5 MMT. Hip abd. 4/5 MMT, 4/5 MMT. B adduction 5/5 MMT. R/L knee ext. 5/5 MMT, 4/5 MMT. R/L knee flex. 4+/5, 3/5 MMT. Pt. able to actively SLR 10x (marked improvement)    Time  4    Period  Weeks    Status  Partially Met    Target Date  05/02/18      PT LONG TERM GOAL #2   Title  Pt. will increase L knee flexion AROM to >90 deg. to improve sitting posture/ gait pattern.      Baseline  Seated L knee A/PROM: flexion 61/66 deg./ extension -33 deg (ext. lag).    Time  4    Period  Weeks    Status  Not Met    Target Date  05/02/18      PT LONG TERM GOAL #3   Title  Pt. will increase FOTO score to 56 to improve functional mobility.      Baseline  FOTO: initial 13    Time  4    Period  Weeks    Status  On-going    Target Date  05/02/18      PT LONG TERM GOAL #4   Title  Pt. able to ambulate with with use of SPC and consistent gait pattern to promote greater independence/ mobility.      Baseline  Moderate L antalgic gait pattern with recent progression to B loftstrand crutches.  Pt. primarily using RW    Time  4    Period  Weeks    Status  Partially Met    Target Date  05/02/18             Plan - 04/18/18 0824    Clinical Impression Statement  Pt. progressing to use of B  loftstrand with consistent step pattern/ cadence and improved heel strike/ mobility as compared to RW.  Pt. has not received crutches from medical supply store.  L knee flexion/ extension remain with significant joint stiffness after manual stretches.  Moderate verbal cuing to promote upright posture/ heel strike with gait.      Clinical Presentation  Evolving    Clinical Decision Making  Moderate    Rehab Potential  Fair    PT Frequency  2x / week    PT Duration  4 weeks    PT Treatment/Interventions  ADLs/Self Care Home Management;Gait training;Functional mobility training;Therapeutic exercise;Balance training;Therapeutic activities;Neuromuscular re-education;Patient/family education;Manual techniques;Passive range of motion;Cryotherapy;Electrical Stimulation;Moist Heat    PT Next Visit Plan  Increase L knee ROM/ LE strengthening in hip/knee.  CHECK SCHEDULE    PT Home Exercise Plan  reviewed HEP    Consulted and Agree with Plan of Care  Patient       Patient will benefit from skilled therapeutic intervention in order to improve the following deficits and impairments:  Abnormal gait, Decreased activity tolerance, Decreased balance, Decreased endurance, Decreased range of motion, Difficulty walking, Hypomobility, Decreased strength, Pain, Improper body mechanics, Decreased mobility, Decreased scar mobility  Visit Diagnosis: Chronic pain of left knee  Joint stiffness of knee, left  Muscle weakness (generalized)  Gait difficulty     Problem List Patient Active Problem List   Diagnosis Date Noted  . Status post total hip replacement, left 02/10/2018  . Primary localized osteoarthritis of right hip 12/24/2015  . Arthrofibrosis of knee joint 03/05/2014  . Hypokalemia 05/30/2013  . OA (osteoarthritis) of knee 05/29/2013  . Postoperative stiffness of total knee replacement ([place]) 02/15/2013   . Postoperative anemia due to acute blood loss 12/09/2012  . Failed total knee arthroplasty ([place]) 12/07/2012   Pura Spice, PT, DPT # [phone] 04/18/2018, 7:03 PM  [place] [facility] [address] [place], [place], [address] Phone: [phone]   Fax:  [phone]  Name: LISBETH PULLER MRN: [id number] Date of Birth: 1950/09/26

## 2018-04-20 ENCOUNTER — Ambulatory Visit: Payer: Medicare HMO | Admitting: Physical Therapy

## 2018-04-26 ENCOUNTER — Encounter: Payer: Self-pay | Admitting: Physical Therapy

## 2018-04-26 ENCOUNTER — Ambulatory Visit: Payer: Medicare HMO | Admitting: Physical Therapy

## 2018-04-26 DIAGNOSIS — M25662 Stiffness of left knee, not elsewhere classified: Secondary | ICD-10-CM

## 2018-04-26 DIAGNOSIS — M6281 Muscle weakness (generalized): Secondary | ICD-10-CM

## 2018-04-26 DIAGNOSIS — M25562 Pain in left knee: Principal | ICD-10-CM

## 2018-04-26 DIAGNOSIS — R269 Unspecified abnormalities of gait and mobility: Secondary | ICD-10-CM

## 2018-04-26 DIAGNOSIS — G8929 Other chronic pain: Secondary | ICD-10-CM

## 2018-04-26 NOTE — Therapy (Signed)
[facility] [address] [place], [place], [address] Phone: [phone]   Fax:  [phone]  Physical Therapy Treatment  Patient Details  Name: [name] [name] MRN: [id number] Date of Birth: 1951/01/24 Referring Provider (PT): Dr. Rozelle Logan   Encounter Date: 04/26/2018  PT End of Session - 04/26/18 0736    Visit Number  14    Number of Visits  17    Date for PT Re-Evaluation  05/02/18    PT Start Time  0727    PT Stop Time  0823    PT Time Calculation (min)  56 min    Activity Tolerance  Patient tolerated treatment well;Patient limited by pain    Behavior During Therapy  [facility] for tasks assessed/performed       Past Medical History:  Diagnosis Date  . Arthritis   . Complication of anesthesia    terrible vertigo  . Hypertension   . PONV (postoperative nausea and vomiting)    related to vertigo  . Shortness of breath    WITH EXERTION  . Vertigo    hx post op    Past Surgical History:  Procedure Laterality Date  . BACK SURGERY  2010   cervical fusion  . BREAST BIOPSY Left 03/31/2011   fibrocystic changes, marked stromal sclerosis, and dense stromal calcifications  . BREAST BIOPSY Left 03/31/2011  . BREAST BIOPSY Right 03/31/2011   fine needle aspiration with clip placement - ultrasound  . BREAST SURGERY Left 2005   biopsy  . CARPAL TUNNEL RELEASE Right   . FOOT SURGERY Left    heel---STATES METAL IN THE HEEL - FOOT TURNS OUTWARD  . HEEL SPUR SURGERY    . HYSTEROSCOPY W/D&C N/A 07/26/2015   Procedure: DILATATION AND CURETTAGE /HYSTEROSCOPY with cervical biopsy;  Surgeon: Benjaman Kindler, MD;  Location: [facility];  Service: Gynecology;  Laterality: N/A;  . JOINT REPLACEMENT Left 2013   knee  . KNEE ARTHROTOMY Left 05/29/2013   Procedure: LEFT KNEE ARTHROTOMY WITH SCAR EXCISION;  Surgeon: Gearlean Alf, MD;  Location: [facility];  Service: Orthopedics;  Laterality: Left;  . KNEE ARTHROTOMY Left 03/05/2014   Procedure:  LEFT KNEE ARTHROTOMY/SCAR EXCISION/POLYETHYLENE REVISION;  Surgeon: Gearlean Alf, MD;  Location: [facility];  Service: Orthopedics;  Laterality: Left;  . KNEE CLOSED REDUCTION Left 02/15/2013   Procedure: CLOSED MANIPULATION LEFT KNEE;  Surgeon: Gearlean Alf, MD;  Location: [facility];  Service: Orthopedics;  Laterality: Left;  . TOTAL HIP ARTHROPLASTY Right 12/24/2015   Procedure: TOTAL HIP ARTHROPLASTY ANTERIOR APPROACH;  Surgeon: Hessie Knows, MD;  Location: [facility];  Service: Orthopedics;  Laterality: Right;  . TOTAL HIP ARTHROPLASTY Left 02/10/2018   Procedure: TOTAL HIP ARTHROPLASTY ANTERIOR APPROACH;  Surgeon: Hessie Knows, MD;  Location: [facility];  Service: Orthopedics;  Laterality: Left;  . TOTAL KNEE REVISION Left 12/07/2012   Procedure:  TOTAL KNEE ARTHROPLASTY REVISION;  Surgeon: Gearlean Alf, MD;  Location: [facility];  Service: Orthopedics;  Laterality: Left;  . TUBAL LIGATION      There were no vitals filed for this visit.  Subjective Assessment - 04/26/18 0735    Subjective  Pt. reports no new issues.  Pt. entered PT with new Loftstrand crutches.  Pt. using 3# ankle wt. at home with ex. program.      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  Limitations  Walking;House hold activities;Lifting    How long can you stand comfortably?  as soon as standing up    How long can you walk comfortably?  as soon as starts     Diagnostic tests  XRAYS    Patient Stated Goals  Walk without pain/sleep without R hip/knee pain/ Ambulate with no assistive device.      Currently in Pain?  Yes    Pain Score  2     Pain Location  Groin    Pain Orientation  Left    Pain Descriptors / Indicators  Aching    Pain Type  Chronic pain          Treatment:  There.ex.:   Forward/ backward/ lateral walking with exaggerated hip movement/ cuing for upright posture.Light BUE assist.High marching with mirror  feedback to correct posture. Walking lunges in //-bars/ L and R gastroc/ hip adductor stretches in //-bars 5x each with holds.   Step ups at stairs with use of B Lofstrand crutches.   Walking with use of Loftstrand crutches in PT clinic from blue mat table.   Nustep L6seat position 1110 min. (warm-up/no charge) Review HEP  Manual tx.:  Seated L knee extension/ flexion stretches/ contract-relax technique at edge of blue mat table. Patellar mobs.All planes of movement(significant hypomobility). Deep STM to L distal quad during knee flexion position.     PT Long Term Goals - 04/04/18 9702      PT LONG TERM GOAL #1   Title  Pt will increase L LE muscle strength 1/2 muscle grade to improve L SLR/ gait pattern without assistive device.      Baseline  R/L hip flex. 3+/5 MMT/ 3+/5 MMT. Hip abd. 4/5 MMT, 4/5 MMT. B adduction 5/5 MMT. R/L knee ext. 5/5 MMT, 4/5 MMT. R/L knee flex. 4+/5, 3/5 MMT. Pt. able to actively SLR 10x (marked improvement)    Time  4    Period  Weeks    Status  Partially Met    Target Date  05/02/18      PT LONG TERM GOAL #2   Title  Pt. will increase L knee flexion AROM to >90 deg. to improve sitting posture/ gait pattern.      Baseline  Seated L knee A/PROM: flexion 61/66 deg./ extension -33 deg (ext. lag).    Time  4    Period  Weeks    Status  Not Met    Target Date  05/02/18      PT LONG TERM GOAL #3   Title  Pt. will increase FOTO score to 56 to improve functional mobility.      Baseline  FOTO: initial 13    Time  4    Period  Weeks    Status  On-going    Target Date  05/02/18      PT LONG TERM GOAL #4   Title  Pt. able to ambulate with with use of SPC and consistent gait pattern to promote greater independence/ mobility.      Baseline  Moderate L antalgic gait pattern with recent progression to B loftstrand crutches.  Pt. primarily using RW    Time  4    Period  Weeks    Status  Partially Met    Target Date  05/02/18          Plan -  04/26/18 0736    Clinical Impression Statement  Pt. ambulates wtih consistent 4-point to 2-point gait pattern with use of  Loftstrand crutches.  Pt. requires several minutes/ steps in PT clinic to correct L heel strike/ stride length.  Pt. able to safely ascend/ descend stairs with step pattern with use of B Loftstrand crutches.  No LOB during tx. session.  Pt. continues to have significant L knee ROM limitations during manual tx.     Clinical Presentation  Evolving    Clinical Decision Making  Moderate    Rehab Potential  Fair    PT Frequency  2x / week    PT Duration  4 weeks    PT Treatment/Interventions  ADLs/Self Care Home Management;Gait training;Functional mobility training;Therapeutic exercise;Balance training;Therapeutic activities;Neuromuscular re-education;Patient/family education;Manual techniques;Passive range of motion;Cryotherapy;Electrical Stimulation;Moist Heat    PT Next Visit Plan  Increase L knee ROM/ LE strengthening in hip/knee.         Patient will benefit from skilled therapeutic intervention in order to improve the following deficits and impairments:  Abnormal gait, Decreased activity tolerance, Decreased balance, Decreased endurance, Decreased range of motion, Difficulty walking, Hypomobility, Decreased strength, Pain, Improper body mechanics, Decreased mobility, Decreased scar mobility  Visit Diagnosis: Chronic pain of left knee  Joint stiffness of knee, left  Muscle weakness (generalized)  Gait difficulty     Problem List Patient Active Problem List   Diagnosis Date Noted  . Status post total hip replacement, left 02/10/2018  . Primary localized osteoarthritis of right hip 12/24/2015  . Arthrofibrosis of knee joint 03/05/2014  . Hypokalemia 05/30/2013  . OA (osteoarthritis) of knee 05/29/2013  . Postoperative stiffness of total knee replacement ([place]) 02/15/2013  . Postoperative anemia due to acute blood loss 12/09/2012  . Failed total knee arthroplasty  ([place]) 12/07/2012   Pura Spice, PT, DPT # [id number] 04/26/2018, 8:18 AM  [facility] [address] [place], [place], [address] Phone: [phone]   Fax:  [phone]  Name: [name] [name] MRN: [id number] Date of Birth: January 31, 1951

## 2018-04-29 ENCOUNTER — Ambulatory Visit: Payer: Medicare HMO | Admitting: Physical Therapy

## 2018-04-29 DIAGNOSIS — M6281 Muscle weakness (generalized): Secondary | ICD-10-CM

## 2018-04-29 DIAGNOSIS — M25562 Pain in left knee: Principal | ICD-10-CM

## 2018-04-29 DIAGNOSIS — G8929 Other chronic pain: Secondary | ICD-10-CM

## 2018-04-29 DIAGNOSIS — R269 Unspecified abnormalities of gait and mobility: Secondary | ICD-10-CM

## 2018-04-29 DIAGNOSIS — M25662 Stiffness of left knee, not elsewhere classified: Secondary | ICD-10-CM

## 2018-04-30 ENCOUNTER — Encounter: Payer: Self-pay | Admitting: Physical Therapy

## 2018-04-30 NOTE — Therapy (Signed)
[facility] [address] [place], [place], [address] Phone: [phone]   Fax:  [phone]  Physical Therapy Treatment  Patient Details  Name: [name] [name] MRN: [id number] Date of Birth: May 21, 1950 Referring Provider (PT): Dr. Rozelle Logan   Encounter Date: 04/29/2018  PT End of Session - 04/30/18 1209    Visit Number  15    Number of Visits  17    Date for PT Re-Evaluation  05/02/18    PT Start Time  0725    PT Stop Time  0829    PT Time Calculation (min)  64 min    Activity Tolerance  Patient tolerated treatment well;Patient limited by pain    Behavior During Therapy  [facility] for tasks assessed/performed       Past Medical History:  Diagnosis Date  . Arthritis   . Complication of anesthesia    terrible vertigo  . Hypertension   . PONV (postoperative nausea and vomiting)    related to vertigo  . Shortness of breath    WITH EXERTION  . Vertigo    hx post op    Past Surgical History:  Procedure Laterality Date  . BACK SURGERY  2010   cervical fusion  . BREAST BIOPSY Left 03/31/2011   fibrocystic changes, marked stromal sclerosis, and dense stromal calcifications  . BREAST BIOPSY Left 03/31/2011  . BREAST BIOPSY Right 03/31/2011   fine needle aspiration with clip placement - ultrasound  . BREAST SURGERY Left 2005   biopsy  . CARPAL TUNNEL RELEASE Right   . FOOT SURGERY Left    heel---STATES METAL IN THE HEEL - FOOT TURNS OUTWARD  . HEEL SPUR SURGERY    . HYSTEROSCOPY W/D&C N/A 07/26/2015   Procedure: DILATATION AND CURETTAGE /HYSTEROSCOPY with cervical biopsy;  Surgeon: Benjaman Kindler, MD;  Location: [facility];  Service: Gynecology;  Laterality: N/A;  . JOINT REPLACEMENT Left 2013   knee  . KNEE ARTHROTOMY Left 05/29/2013   Procedure: LEFT KNEE ARTHROTOMY WITH SCAR EXCISION;  Surgeon: Gearlean Alf, MD;  Location: [facility];  Service: Orthopedics;  Laterality: Left;  . KNEE ARTHROTOMY Left 03/05/2014   Procedure:  LEFT KNEE ARTHROTOMY/SCAR EXCISION/POLYETHYLENE REVISION;  Surgeon: Gearlean Alf, MD;  Location: [facility];  Service: Orthopedics;  Laterality: Left;  . KNEE CLOSED REDUCTION Left 02/15/2013   Procedure: CLOSED MANIPULATION LEFT KNEE;  Surgeon: Gearlean Alf, MD;  Location: [facility];  Service: Orthopedics;  Laterality: Left;  . TOTAL HIP ARTHROPLASTY Right 12/24/2015   Procedure: TOTAL HIP ARTHROPLASTY ANTERIOR APPROACH;  Surgeon: Hessie Knows, MD;  Location: [facility];  Service: Orthopedics;  Laterality: Right;  . TOTAL HIP ARTHROPLASTY Left 02/10/2018   Procedure: TOTAL HIP ARTHROPLASTY ANTERIOR APPROACH;  Surgeon: Hessie Knows, MD;  Location: [facility];  Service: Orthopedics;  Laterality: Left;  . TOTAL KNEE REVISION Left 12/07/2012   Procedure:  TOTAL KNEE ARTHROPLASTY REVISION;  Surgeon: Gearlean Alf, MD;  Location: [facility];  Service: Orthopedics;  Laterality: Left;  . TUBAL LIGATION      There were no vitals filed for this visit.  Subjective Assessment - 04/30/18 1206    Subjective  Pt. states she is working hard at home to improve L knee ROM/ LE strengthening but feels she is not progressing.  Pt. states she always has stiffness/ pain in knee.      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      Limitations  Walking;House hold activities;Lifting    How long can you stand comfortably?  as soon as standing up    How long can you walk comfortably?  as soon as starts     Diagnostic tests  XRAYS    Patient Stated Goals  Walk without pain/sleep without R hip/knee pain/ Ambulate with no assistive device.      Currently in Pain?  Yes    Pain Score  4     Pain Location  Knee    Pain Orientation  Left    Pain Descriptors / Indicators  Aching          Treatment:  There.ex.:   Forward/ backward/ lateral walking with exaggerated hip movement/ cuing for upright posture.Light BUE assist.High marching  with mirror feedback to correct posture. Walking lunges in //-bars/ L and R gastroc/ hip adductor stretches in //-bars 5x each with holds.   TG knee flexion (increase static holds at 90 deg. Flexion).   Walking with use of Loftstrand crutches in PT clinic from blue mat table. Nustep L6seat position 03-1009 min. (warm-up/no charge)  Manual tx.:  Seated L knee extension/ flexion stretches/ contract-relax technique at edge of blue mat table. Patellar mobs.All planes of movement(significant hypomobility). Deep STM to L distal quad during knee flexion position.    PT Long Term Goals - 04/04/18 6503      PT LONG TERM GOAL #1   Title  Pt will increase L LE muscle strength 1/2 muscle grade to improve L SLR/ gait pattern without assistive device.      Baseline  R/L hip flex. 3+/5 MMT/ 3+/5 MMT. Hip abd. 4/5 MMT, 4/5 MMT. B adduction 5/5 MMT. R/L knee ext. 5/5 MMT, 4/5 MMT. R/L knee flex. 4+/5, 3/5 MMT. Pt. able to actively SLR 10x (marked improvement)    Time  4    Period  Weeks    Status  Partially Met    Target Date  05/02/18      PT LONG TERM GOAL #2   Title  Pt. will increase L knee flexion AROM to >90 deg. to improve sitting posture/ gait pattern.      Baseline  Seated L knee A/PROM: flexion 61/66 deg./ extension -33 deg (ext. lag).    Time  4    Period  Weeks    Status  Not Met    Target Date  05/02/18      PT LONG TERM GOAL #3   Title  Pt. will increase FOTO score to 56 to improve functional mobility.      Baseline  FOTO: initial 13    Time  4    Period  Weeks    Status  On-going    Target Date  05/02/18      PT LONG TERM GOAL #4   Title  Pt. able to ambulate with with use of SPC and consistent gait pattern to promote greater independence/ mobility.      Baseline  Moderate L antalgic gait pattern with recent progression to B loftstrand crutches.  Pt. primarily using RW    Time  4    Period  Weeks    Status  Partially Met    Target Date  05/02/18             Plan - 04/30/18 1209    Clinical Impression Statement  Significant L knee joint stiffness with both flexion/ extension A/PROM in seated posture.  Pt. progressing with hip strengthening/ ther.ex. program.  Pt. continues to benefit from use of Loftstrand crutches with consistent gait pattern/ no balance issues noted.      Clinical Presentation  Evolving    Clinical Decision Making  Moderate    Rehab Potential  Fair    PT Frequency  2x / week    PT Duration  4 weeks    PT Treatment/Interventions  ADLs/Self Care Home Management;Gait training;Functional mobility training;Therapeutic exercise;Balance training;Therapeutic activities;Neuromuscular re-education;Patient/family education;Manual techniques;Passive range of motion;Cryotherapy;Electrical Stimulation;Moist Heat    PT Next Visit Plan  Increase L knee ROM/ LE strengthening in hip/knee.   CHECK GOALS/ SCHEDULED NEXT TX SESSION.      PT Home Exercise Plan  reviewed HEP       Patient will benefit from skilled therapeutic intervention in order to improve the following deficits and impairments:  Abnormal gait, Decreased activity tolerance, Decreased balance, Decreased endurance, Decreased range of motion, Difficulty walking, Hypomobility, Decreased strength, Pain, Improper body mechanics, Decreased mobility, Decreased scar mobility  Visit Diagnosis: Chronic pain of left knee  Joint stiffness of knee, left  Muscle weakness (generalized)  Gait difficulty     Problem List Patient Active Problem List   Diagnosis Date Noted  . Status post total hip replacement, left 02/10/2018  . Primary localized osteoarthritis of right hip 12/24/2015  . Arthrofibrosis of knee joint 03/05/2014  . Hypokalemia 05/30/2013  . OA (osteoarthritis) of knee 05/29/2013  . Postoperative stiffness of total knee replacement ([place]) 02/15/2013  . Postoperative anemia due to acute blood loss 12/09/2012  . 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 [place], [place], [address] Phone: [phone]   Fax:  [phone]  Name: [name] [name] MRN: [id number] Date of Birth: Oct 01, 1950

## 2018-05-03 ENCOUNTER — Encounter: Payer: Self-pay | Admitting: Physical Therapy

## 2018-05-03 ENCOUNTER — Ambulatory Visit: Payer: Medicare HMO | Admitting: Physical Therapy

## 2018-05-03 DIAGNOSIS — R269 Unspecified abnormalities of gait and mobility: Secondary | ICD-10-CM

## 2018-05-03 DIAGNOSIS — M25562 Pain in left knee: Secondary | ICD-10-CM | POA: Diagnosis not present

## 2018-05-03 DIAGNOSIS — G8929 Other chronic pain: Secondary | ICD-10-CM

## 2018-05-03 DIAGNOSIS — M25662 Stiffness of left knee, not elsewhere classified: Secondary | ICD-10-CM

## 2018-05-03 DIAGNOSIS — M6281 Muscle weakness (generalized): Secondary | ICD-10-CM

## 2018-05-03 NOTE — Therapy (Signed)
[facility] [address] [place], [place], [address] Phone: [phone]   Fax:  [phone]  Physical Therapy Treatment  Patient Details  Name: [name] [name] MRN: [id number] Date of Birth: 04-15-1951 Referring Provider (PT): Dr. Rozelle Logan   Encounter Date: 05/03/2018  PT End of Session - 05/03/18 0743    Visit Number  16    Number of Visits  24    Date for PT Re-Evaluation  05/31/18    PT Start Time  0736    PT Stop Time  0838    PT Time Calculation (min)  62 min    Activity Tolerance  Patient tolerated treatment well;Patient limited by pain    Behavior During Therapy  [facility] for tasks assessed/performed       Past Medical History:  Diagnosis Date  . Arthritis   . Complication of anesthesia    terrible vertigo  . Hypertension   . PONV (postoperative nausea and vomiting)    related to vertigo  . Shortness of breath    WITH EXERTION  . Vertigo    hx post op    Past Surgical History:  Procedure Laterality Date  . BACK SURGERY  2010   cervical fusion  . BREAST BIOPSY Left 03/31/2011   fibrocystic changes, marked stromal sclerosis, and dense stromal calcifications  . BREAST BIOPSY Left 03/31/2011  . BREAST BIOPSY Right 03/31/2011   fine needle aspiration with clip placement - ultrasound  . BREAST SURGERY Left 2005   biopsy  . CARPAL TUNNEL RELEASE Right   . FOOT SURGERY Left    heel---STATES METAL IN THE HEEL - FOOT TURNS OUTWARD  . HEEL SPUR SURGERY    . HYSTEROSCOPY W/D&C N/A 07/26/2015   Procedure: DILATATION AND CURETTAGE /HYSTEROSCOPY with cervical biopsy;  Surgeon: Benjaman Kindler, MD;  Location: [facility];  Service: Gynecology;  Laterality: N/A;  . JOINT REPLACEMENT Left 2013   knee  . KNEE ARTHROTOMY Left 05/29/2013   Procedure: LEFT KNEE ARTHROTOMY WITH SCAR EXCISION;  Surgeon: Gearlean Alf, MD;  Location: [facility];  Service: Orthopedics;  Laterality: Left;  . KNEE ARTHROTOMY Left 03/05/2014   Procedure:  LEFT KNEE ARTHROTOMY/SCAR EXCISION/POLYETHYLENE REVISION;  Surgeon: Gearlean Alf, MD;  Location: [facility];  Service: Orthopedics;  Laterality: Left;  . KNEE CLOSED REDUCTION Left 02/15/2013   Procedure: CLOSED MANIPULATION LEFT KNEE;  Surgeon: Gearlean Alf, MD;  Location: [facility];  Service: Orthopedics;  Laterality: Left;  . TOTAL HIP ARTHROPLASTY Right 12/24/2015   Procedure: TOTAL HIP ARTHROPLASTY ANTERIOR APPROACH;  Surgeon: Hessie Knows, MD;  Location: [facility];  Service: Orthopedics;  Laterality: Right;  . TOTAL HIP ARTHROPLASTY Left 02/10/2018   Procedure: TOTAL HIP ARTHROPLASTY ANTERIOR APPROACH;  Surgeon: Hessie Knows, MD;  Location: [facility];  Service: Orthopedics;  Laterality: Left;  . TOTAL KNEE REVISION Left 12/07/2012   Procedure:  TOTAL KNEE ARTHROPLASTY REVISION;  Surgeon: Gearlean Alf, MD;  [facility];  Service: Orthopedics;  Laterality: Left;  . TUBAL LIGATION      There were no vitals filed for this visit.  Subjective Assessment - 05/03/18 0742    Subjective  Pt. reports L knee stiffness but not has bad as normal.  Pt. entered PT with use of B Loftstrand crutches.      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      Limitations  Walking;House hold activities;Lifting    How long can you stand comfortably?  as soon as standing up    How long can you walk comfortably?  as soon as starts     Diagnostic tests  XRAYS    Currently in Pain?  Yes    Pain Score  3     Pain Location  Knee    Pain Orientation  Left    Pain Descriptors / Indicators  Aching    Pain Type  Chronic pain         [facility] PT Assessment - 05/03/18 0001      Assessment   Medical Diagnosis  R knee pain/ joint stiffness    Referring Provider (PT)  Dr. Rozelle Logan    Onset Date/Surgical Date  02/10/18    Prior Therapy  yes, known well to PT      Precautions   Precautions  Anterior Hip      Balance Screen    Has the patient fallen in the past 6 months  No      Prior Function   Level of Independence  Requires assistive device for independence      Cognition   Overall Cognitive Status  Within Functional Limits for tasks assessed        Treatment:  Manual tx.:  Seated/ supine L knee extension/ flexion stretches/ contract-relax technique at edge of blue mat table. Patellar mobs.All planes of movement(significant hypomobility) during TG. Deep STM to L distal quad during knee flexion position.  There.ex.:   Forward/ backward/ lateral walking with exaggerated hip movement/ cuing for upright posture.Light RUE assist (1 handed UE assist with all tasks).High marching with mirror feedback to correct posture. Sit to stand from blue mat table with no UE assist.  Cuing for increase use of L LE (PT placed R foot forward of L to encourage proper wt. Shifting).   TG knee flexion (increase static holds at 90 deg. Flexion).   Walking with use of Loftstrand crutches in PT clinic from blue mat table. Nustep L6seat position 03-1009+ min. (warm-up/no charge)      PT Long Term Goals - 05/03/18 0746      PT LONG TERM GOAL #1   Title  Pt will increase L LE muscle strength 1/2 muscle grade to improve L SLR/ gait pattern without assistive device.      Baseline  L/R hip flex. 3+/5 MMT/ 4/5 MMT. Hip abd. 4/5 MMT, 4+/5 MMT. B adduction 5/5 MMT. R/L knee ext. 5/5 MMT, 4/5 MMT. R/L knee flex. 4+/5, 4+/5 MMT.     Time  4    Period  Weeks    Status  Partially Met    Target Date  05/31/18      PT LONG TERM GOAL #2   Title  Pt. will increase L knee flexion AROM to >90 deg. to improve sitting posture/ gait pattern.      Baseline  Seated L knee A/PROM: flexion 68/82 deg./ extension -29 deg (ext. lag).    Time  4    Period  Weeks    Status  Not Met    Target Date  05/31/18      PT LONG TERM GOAL #3   Title  Pt. will increase FOTO score to 56 to improve functional mobility.      Baseline   FOTO: initial 13.  Today: 44 and Goal: 53.      Time  4    Period  Weeks  Status  Partially Met    Target Date  05/31/18      PT LONG TERM GOAL #4   Title  Pt. able to ambulate with with use of SPC and consistent gait pattern to promote greater independence/ mobility.      Baseline  Moderate L antalgic gait pattern with recent progression to B loftstrand crutches.      Time  4    Period  Weeks    Status  Partially Met    Target Date  05/31/18         Plan - 05/03/18 0848    Clinical Impression Statement  Significant L knee joint stiffness/ hip muscle weakness remains primary limiting factor in order to progress off assistive devices.  Pt. has progressed off RW to B Loftstrand crutches with more consistent step/gait pattern and heel strike on L.  No increase c/o L groin/hip pain after standing ther.ex./ marching.  See updated goals.  Improvement in FOTO: initial 38/ today 44/ goal 53.  Pt. will continue to benefit from skilled PT to progress hip/LE muscle strengthening and independence with mobility/ walking.       Clinical Presentation  Evolving    Clinical Decision Making  Moderate    Rehab Potential  Fair    PT Frequency  2x / week    PT Duration  4 weeks    PT Treatment/Interventions  ADLs/Self Care Home Management;Gait training;Functional mobility training;Therapeutic exercise;Balance training;Therapeutic activities;Neuromuscular re-education;Patient/family education;Manual techniques;Passive range of motion;Cryotherapy;Electrical Stimulation;Moist Heat    PT Next Visit Plan  Increase L knee ROM/ LE strengthening in hip/knee.         Patient will benefit from skilled therapeutic intervention in order to improve the following deficits and impairments:  Abnormal gait, Decreased activity tolerance, Decreased balance, Decreased endurance, Decreased range of motion, Difficulty walking, Hypomobility, Decreased strength, Pain, Improper body mechanics, Decreased mobility, Decreased scar  mobility  Visit Diagnosis: Chronic pain of left knee  Joint stiffness of knee, left  Muscle weakness (generalized)  Gait difficulty     Problem List Patient Active Problem List   Diagnosis Date Noted  . Status post total hip replacement, left 02/10/2018  . Primary localized osteoarthritis of right hip 12/24/2015  . Arthrofibrosis of knee joint 03/05/2014  . Hypokalemia 05/30/2013  . OA (osteoarthritis) of knee 05/29/2013  . Postoperative stiffness of total knee replacement ([place]) 02/15/2013  . Postoperative anemia due to acute blood loss 12/09/2012  . Failed total knee arthroplasty ([place]) 12/07/2012   Pura Spice, PT, DPT # [phone] 05/03/2018, 8:57 AM   [facility] [address] [place], [place], [address] Phone: [phone]   Fax:  [phone]  Name: KATHERENE DININO MRN: [id number] Date of Birth: 1951-05-03

## 2018-05-06 ENCOUNTER — Ambulatory Visit: Payer: Medicare HMO | Attending: Orthopedic Surgery | Admitting: Physical Therapy

## 2018-05-06 DIAGNOSIS — M25662 Stiffness of left knee, not elsewhere classified: Secondary | ICD-10-CM | POA: Insufficient documentation

## 2018-05-06 DIAGNOSIS — R269 Unspecified abnormalities of gait and mobility: Secondary | ICD-10-CM | POA: Diagnosis present

## 2018-05-06 DIAGNOSIS — M25562 Pain in left knee: Secondary | ICD-10-CM | POA: Diagnosis not present

## 2018-05-06 DIAGNOSIS — G8929 Other chronic pain: Secondary | ICD-10-CM | POA: Diagnosis present

## 2018-05-06 DIAGNOSIS — M6281 Muscle weakness (generalized): Secondary | ICD-10-CM

## 2018-05-06 NOTE — Therapy (Addendum)
[facility] [address] [place], [place], [address] Phone: [phone]   Fax:  [phone]  Physical Therapy Treatment  Patient Details  Name: [name] [name] MRN: [id number] Date of Birth: [birth year] Referring Provider (PT): Dr. Rozelle Logan   Encounter Date: 05/06/2018  PT End of Session - 05/08/18 2010    Visit Number  17    Number of Visits  24    Date for PT Re-Evaluation  05/31/18    PT Start Time  0820    PT Stop Time  0914    PT Time Calculation (min)  54 min    Activity Tolerance  Patient tolerated treatment well;Patient limited by pain    Behavior During Therapy  [facility] for tasks assessed/performed       Past Medical History:  Diagnosis Date  . Arthritis   . Complication of anesthesia    terrible vertigo  . Hypertension   . PONV (postoperative nausea and vomiting)    related to vertigo  . Shortness of breath    WITH EXERTION  . Vertigo    hx post op    Past Surgical History:  Procedure Laterality Date  . BACK SURGERY  2010   cervical fusion  . BREAST BIOPSY Left 03/31/2011   fibrocystic changes, marked stromal sclerosis, and dense stromal calcifications  . BREAST BIOPSY Left 03/31/2011  . BREAST BIOPSY Right 03/31/2011   fine needle aspiration with clip placement - ultrasound  . BREAST SURGERY Left 2005   biopsy  . CARPAL TUNNEL RELEASE Right   . FOOT SURGERY Left    heel---STATES METAL IN THE HEEL - FOOT TURNS OUTWARD  . HEEL SPUR SURGERY    . HYSTEROSCOPY W/D&C N/A 07/26/2015   Procedure: DILATATION AND CURETTAGE /HYSTEROSCOPY with cervical biopsy;  Surgeon: Benjaman Kindler, MD;  Location: [facility];  Service: Gynecology;  Laterality: N/A;  . JOINT REPLACEMENT Left 2013   knee  . KNEE ARTHROTOMY Left 05/29/2013   Procedure: LEFT KNEE ARTHROTOMY WITH SCAR EXCISION;  Surgeon: Gearlean Alf, MD;  [facility];  Service: Orthopedics;  Laterality: Left;  . KNEE ARTHROTOMY Left 03/05/2014   Procedure:  LEFT KNEE ARTHROTOMY/SCAR EXCISION/POLYETHYLENE REVISION;  Surgeon: Gearlean Alf, MD;  [facility];  Service: Orthopedics;  Laterality: Left;  . KNEE CLOSED REDUCTION Left 02/15/2013   Procedure: CLOSED MANIPULATION LEFT KNEE;  Surgeon: Gearlean Alf, MD;  [facility];  Service: Orthopedics;  Laterality: Left;  . TOTAL HIP ARTHROPLASTY Right 12/24/2015   Procedure: TOTAL HIP ARTHROPLASTY ANTERIOR APPROACH;  Surgeon: Hessie Knows, MD;  Location: [facility];  Service: Orthopedics;  Laterality: Right;  . TOTAL HIP ARTHROPLASTY Left 02/10/2018   Procedure: TOTAL HIP ARTHROPLASTY ANTERIOR APPROACH;  Surgeon: Hessie Knows, MD;  Location: [facility];  Service: Orthopedics;  Laterality: Left;  . TOTAL KNEE REVISION Left 12/07/2012   Procedure:  TOTAL KNEE ARTHROPLASTY REVISION;  Surgeon: Gearlean Alf, MD;  [facility];  Service: Orthopedics;  Laterality: Left;  . TUBAL LIGATION      There were no vitals filed for this visit.  Subjective Assessment - 05/08/18 2000    Subjective  Pt. reports hip/knees are getting better.  Pt. continues to have increase knee stiffness with prolonged sitting.      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      Limitations  Walking;House hold activities;Lifting  How long can you stand comfortably?  as soon as standing up    How long can you walk comfortably?  as soon as starts     Patient Stated Goals  Walk without pain/sleep without R hip/knee pain/ Ambulate with no assistive device.      Currently in Pain?  Yes    Pain Score  3     Pain Location  Knee    Pain Orientation  Left    Pain Descriptors / Indicators  Aching    Pain Type  Chronic pain          Treatment:  Manual tx.:  Seated/ supine L knee extension/ flexion stretches/ contract-relax technique at edge of blue mat table. Patellar mobs.All planes of movement(significant hypomobility) during  TG. Deep STM to L distal quad during knee flexion position.  There.ex.:   Resisted gait: 2BTB forward/ backward/ lateral walking with cuing for upright posture.Light RUE assist (1 handed UE assist with all tasks). High marching with mirror feedback to correct posture. Sit to stand from blue mat table with no UE assist.  Cuing for increase use of L LE (PT placed R foot forward of L to encourage proper wt. Shifting).   Step ups (recip. Pattern ascending/ descending). TG knee flexion (increase static holds at 90 deg. Flexion). Walking with/ without use of Loftstrand crutches in PT clinic safely. Supine SLR 10x2/ R sidelying L hip abduction 15x (increase L ankle pain).   Nustep L6seat position 03-1009+ min. (warm-up/no charge)     PT Long Term Goals - 05/03/18 0746      PT LONG TERM GOAL #1   Title  Pt will increase L LE muscle strength 1/2 muscle grade to improve L SLR/ gait pattern without assistive device.      Baseline  L/R hip flex. 3+/5 MMT/ 4/5 MMT. Hip abd. 4/5 MMT, 4+/5 MMT. B adduction 5/5 MMT. R/L knee ext. 5/5 MMT, 4/5 MMT. R/L knee flex. 4+/5, 4+/5 MMT.     Time  4    Period  Weeks    Status  Partially Met    Target Date  05/31/18      PT LONG TERM GOAL #2   Title  Pt. will increase L knee flexion AROM to >90 deg. to improve sitting posture/ gait pattern.      Baseline  Seated L knee A/PROM: flexion 68/82 deg./ extension -29 deg (ext. lag).    Time  4    Period  Weeks    Status  Not Met    Target Date  05/31/18      PT LONG TERM GOAL #3   Title  Pt. will increase FOTO score to 56 to improve functional mobility.      Baseline  FOTO: initial 13.  Today: 44 and Goal: 53.      Time  4    Period  Weeks    Status  Partially Met    Target Date  05/31/18      PT LONG TERM GOAL #4   Title  Pt. able to ambulate with with use of SPC and consistent gait pattern to promote greater independence/ mobility.      Baseline  Moderate L antalgic gait pattern with  recent progression to B loftstrand crutches.      Time  4    Period  Weeks    Status  Partially Met    Target Date  05/31/18  Plan - 05/08/18 2014    Clinical Impression Statement  Pt. has progressed to ascending steps with recip. pattern and heavy UE assist/ descending with L LE is difficult secondary to limited R knee flexion and muscle control.  Marked improvement in L SLR and hip abduction in R sidelying position.  Pt. reports increase L ankle disocmfort during hip abduction which resolved with position change.  Pt. demonstrates ability to ambulate around PT clinic without assistive device but significant antalgic gait pattern.      Clinical Presentation  Evolving    Clinical Decision Making  Moderate    Rehab Potential  Fair    PT Frequency  2x / week    PT Duration  4 weeks    PT Treatment/Interventions  ADLs/Self Care Home Management;Gait training;Functional mobility training;Therapeutic exercise;Balance training;Therapeutic activities;Neuromuscular re-education;Patient/family education;Manual techniques;Passive range of motion;Cryotherapy;Electrical Stimulation;Moist Heat    PT Next Visit Plan  Increase L knee ROM/ LE strengthening in hip/knee.         Patient will benefit from skilled therapeutic intervention in order to improve the following deficits and impairments:  Abnormal gait, Decreased activity tolerance, Decreased balance, Decreased endurance, Decreased range of motion, Difficulty walking, Hypomobility, Decreased strength, Pain, Improper body mechanics, Decreased mobility, Decreased scar mobility  Visit Diagnosis: Chronic pain of left knee  Joint stiffness of knee, left  Muscle weakness (generalized)  Gait difficulty     Problem List Patient Active Problem List   Diagnosis Date Noted  . Status post total hip replacement, left 02/10/2018  . Primary localized osteoarthritis of right hip 12/24/2015  . Arthrofibrosis of knee joint 03/05/2014  .  Hypokalemia 05/30/2013  . OA (osteoarthritis) of knee 05/29/2013  . Postoperative stiffness of total knee replacement ([place]) 02/15/2013  . Postoperative anemia due to acute blood loss 12/09/2012  . Failed total knee arthroplasty ([place]) 12/07/2012   Pura Spice, PT, DPT # [phone] 05/08/2018, 8:20 PM  [place] [facility] [address] [place], [place], [address] Phone: [phone]   Fax:  [phone]  Name: [name] [name] MRN: [id number] Date of Birth: 16-Mar-1951

## 2018-05-08 ENCOUNTER — Encounter: Payer: Self-pay | Admitting: Physical Therapy

## 2018-05-09 ENCOUNTER — Ambulatory Visit: Payer: Medicare HMO | Admitting: Physical Therapy

## 2018-05-09 ENCOUNTER — Encounter: Payer: Self-pay | Admitting: Physical Therapy

## 2018-05-09 DIAGNOSIS — R269 Unspecified abnormalities of gait and mobility: Secondary | ICD-10-CM

## 2018-05-09 DIAGNOSIS — M25562 Pain in left knee: Secondary | ICD-10-CM | POA: Diagnosis not present

## 2018-05-09 DIAGNOSIS — G8929 Other chronic pain: Secondary | ICD-10-CM

## 2018-05-09 DIAGNOSIS — M25662 Stiffness of left knee, not elsewhere classified: Secondary | ICD-10-CM

## 2018-05-09 DIAGNOSIS — M6281 Muscle weakness (generalized): Secondary | ICD-10-CM

## 2018-05-09 NOTE — Therapy (Signed)
[place] [facility] [address] [place], [place], [address] Phone: [phone]   Fax:  [phone]  Physical Therapy Treatment  Patient Details  Name: [name] [name] MRN: [id number] Date of Birth: 03/16/1951 Referring Provider (PT): Dr. Rozelle Logan   Encounter Date: 05/09/2018   Treatment: 18 of 24.  Recert date: 08/28/621 7628 to 3151   Past Medical History:  Diagnosis Date  . Arthritis   . Complication of anesthesia    terrible vertigo  . Hypertension   . PONV (postoperative nausea and vomiting)    related to vertigo  . Shortness of breath    WITH EXERTION  . Vertigo    hx post op    Past Surgical History:  Procedure Laterality Date  . BACK SURGERY  2010   cervical fusion  . BREAST BIOPSY Left 03/31/2011   fibrocystic changes, marked stromal sclerosis, and dense stromal calcifications  . BREAST BIOPSY Left 03/31/2011  . BREAST BIOPSY Right 03/31/2011   fine needle aspiration with clip placement - ultrasound  . BREAST SURGERY Left 2005   biopsy  . CARPAL TUNNEL RELEASE Right   . FOOT SURGERY Left    heel---STATES METAL IN THE HEEL - FOOT TURNS OUTWARD  . HEEL SPUR SURGERY    . HYSTEROSCOPY W/D&C N/A 07/26/2015   Procedure: DILATATION AND CURETTAGE /HYSTEROSCOPY with cervical biopsy;  Surgeon: Benjaman Kindler, MD;  Location: [facility];  Service: Gynecology;  Laterality: N/A;  . JOINT REPLACEMENT Left 2013   knee  . KNEE ARTHROTOMY Left 05/29/2013   Procedure: LEFT KNEE ARTHROTOMY WITH SCAR EXCISION;  Surgeon: Gearlean Alf, MD;  [facility];  Service: Orthopedics;  Laterality: Left;  . KNEE ARTHROTOMY Left 03/05/2014   Procedure: LEFT KNEE ARTHROTOMY/SCAR EXCISION/POLYETHYLENE REVISION;  Surgeon: Gearlean Alf, MD;  [facility];  Service: Orthopedics;  Laterality: Left;  . KNEE CLOSED REDUCTION Left 02/15/2013   Procedure: CLOSED MANIPULATION LEFT KNEE;  Surgeon: Gearlean Alf, MD;  [facility];   Service: Orthopedics;  Laterality: Left;  . TOTAL HIP ARTHROPLASTY Right 12/24/2015   Procedure: TOTAL HIP ARTHROPLASTY ANTERIOR APPROACH;  Surgeon: Hessie Knows, MD;  Location: [facility];  Service: Orthopedics;  Laterality: Right;  . TOTAL HIP ARTHROPLASTY Left 02/10/2018   Procedure: TOTAL HIP ARTHROPLASTY ANTERIOR APPROACH;  Surgeon: Hessie Knows, MD;  Location: [facility];  Service: Orthopedics;  Laterality: Left;  . TOTAL KNEE REVISION Left 12/07/2012   Procedure:  TOTAL KNEE ARTHROPLASTY REVISION;  Surgeon: Gearlean Alf, MD;  [facility];  Service: Orthopedics;  Laterality: Left;  . TUBAL LIGATION      There were no vitals filed for this visit.    Pt. reports increase low back discomfort this morning. Pt. reports pain resolves once she starts moving/ walking.       Treatment:  Manual tx.:  SupineL knee extension/ flexion stretches/ contract-relax technique on mat table.   Patellar mobs.All planes of movement(significant hypomobility)during TG. Deep STM to L distal quad during knee flexion position.  There.ex.:   Resisted gait: 2BTB forward/ backward/ lateral walking with cuing for upright posture.Light RUE assist (1 handed UE assist with all tasks). High marching with mirror feedback to correct posture. Sit to stand from blue mat table with no UE assist. Cuing for increase use of L LE (PT placed R foot forward of L to encourage proper wt. Shifting).  Step ups (recip. Pattern ascending/ descending). TG knee flexion (increase static holds at 90 deg.  Flexion). Walking with/ without use of Loftstrand crutches in PT clinic safely. Supine SLR 10x2.  Nustep L6seat position 1019mn. (warm-up/no charge)   PT Long Term Goals - 05/03/18 0746      PT LONG TERM GOAL #1   Title  Pt will increase L LE muscle strength 1/2 muscle grade to improve L SLR/ gait pattern without assistive device.      Baseline  L/R hip flex. 3+/5 MMT/ 4/5 MMT. Hip abd. 4/5 MMT,  4+/5 MMT. B adduction 5/5 MMT. R/L knee ext. 5/5 MMT, 4/5 MMT. R/L knee flex. 4+/5, 4+/5 MMT.     Time  4    Period  Weeks    Status  Partially Met    Target Date  05/31/18      PT LONG TERM GOAL #2   Title  Pt. will increase L knee flexion AROM to >90 deg. to improve sitting posture/ gait pattern.      Baseline  Seated L knee A/PROM: flexion 68/82 deg./ extension -29 deg (ext. lag).    Time  4    Period  Weeks    Status  Not Met    Target Date  05/31/18      PT LONG TERM GOAL #3   Title  Pt. will increase FOTO score to 56 to improve functional mobility.      Baseline  FOTO: initial 13.  Today: 44 and Goal: 53.      Time  4    Period  Weeks    Status  Partially Met    Target Date  05/31/18      PT LONG TERM GOAL #4   Title  Pt. able to ambulate with with use of SPC and consistent gait pattern to promote greater independence/ mobility.      Baseline  Moderate L antalgic gait pattern with recent progression to B loftstrand crutches.      Time  4    Period  Weeks    Status  Partially Met    Target Date  05/31/18         Significant L knee joint stiffness remains and is preventing pt. from a more independent gait pattern. Pt. benefits from use of Loftstrand crutches secondary to L hip muscle weakness/ knee joint stiffness. Pt. reports no low back pain after tx. session and demonstrates more consistent L heel strike.       Patient will benefit from skilled therapeutic intervention in order to improve the following deficits and impairments:  Abnormal gait, Decreased activity tolerance, Decreased balance, Decreased endurance, Decreased range of motion, Difficulty walking, Hypomobility, Decreased strength, Pain, Improper body mechanics, Decreased mobility, Decreased scar mobility  Visit Diagnosis: Chronic pain of left knee  Joint stiffness of knee, left  Muscle weakness (generalized)  Gait difficulty     Problem List Patient Active Problem List   Diagnosis Date Noted   . Status post total hip replacement, left 02/10/2018  . Primary localized osteoarthritis of right hip 12/24/2015  . Arthrofibrosis of knee joint 03/05/2014  . Hypokalemia 05/30/2013  . OA (osteoarthritis) of knee 05/29/2013  . [place] 02/15/2013  . Postoperative anemia due to acute blood loss 12/09/2012  . Failed total knee arthroplasty ([place] 12/07/2012   MPura Spice PT, DPT # [phone]/12/2018, 7:15 AM  [facility]MP[address] [place] [address]Phone: [phone]  Fax:  [phone] Name: SLemmie EvensMRN:  [id number] Date of Birth: June 06, 1950

## 2018-05-11 ENCOUNTER — Encounter: Payer: Self-pay | Admitting: Physical Therapy

## 2018-05-11 ENCOUNTER — Ambulatory Visit: Payer: Medicare HMO | Admitting: Physical Therapy

## 2018-05-11 DIAGNOSIS — M6281 Muscle weakness (generalized): Secondary | ICD-10-CM

## 2018-05-11 DIAGNOSIS — M25562 Pain in left knee: Principal | ICD-10-CM

## 2018-05-11 DIAGNOSIS — M25662 Stiffness of left knee, not elsewhere classified: Secondary | ICD-10-CM

## 2018-05-11 DIAGNOSIS — R269 Unspecified abnormalities of gait and mobility: Secondary | ICD-10-CM

## 2018-05-11 DIAGNOSIS — G8929 Other chronic pain: Secondary | ICD-10-CM

## 2018-05-11 NOTE — Therapy (Signed)
[facility] [address]. [place], [place], [address] Phone: [phone]   Fax:  [phone]  Physical Therapy Treatment  Patient Details  Name: [name] [name] MRN: [id number] Date of Birth: 1950/09/07 Referring Provider (PT): Dr. Rozelle Logan   Encounter Date: 05/11/2018  PT End of Session - 05/11/18 1418    Visit Number  19    Number of Visits  24    Date for PT Re-Evaluation  05/31/18    PT Start Time  0816    PT Stop Time  0909    PT Time Calculation (min)  53 min    Activity Tolerance  Patient tolerated treatment well;Patient limited by pain    Behavior During Therapy  [facility] for tasks assessed/performed       Past Medical History:  Diagnosis Date  . Arthritis   . Complication of anesthesia    terrible vertigo  . Hypertension   . PONV (postoperative nausea and vomiting)    related to vertigo  . Shortness of breath    WITH EXERTION  . Vertigo    hx post op    Past Surgical History:  Procedure Laterality Date  . BACK SURGERY  2010   cervical fusion  . BREAST BIOPSY Left 03/31/2011   fibrocystic changes, marked stromal sclerosis, and dense stromal calcifications  . BREAST BIOPSY Left 03/31/2011  . BREAST BIOPSY Right 03/31/2011   fine needle aspiration with clip placement - ultrasound  . BREAST SURGERY Left 2005   biopsy  . CARPAL TUNNEL RELEASE Right   . FOOT SURGERY Left    heel---STATES METAL IN THE HEEL - FOOT TURNS OUTWARD  . HEEL SPUR SURGERY    . HYSTEROSCOPY W/D&C N/A 07/26/2015   Procedure: DILATATION AND CURETTAGE /HYSTEROSCOPY with cervical biopsy;  Surgeon: Benjaman Kindler, MD;  Location: [facility];  Service: Gynecology;  Laterality: N/A;  . JOINT REPLACEMENT Left 2013   knee  . KNEE ARTHROTOMY Left 05/29/2013   Procedure: LEFT KNEE ARTHROTOMY WITH SCAR EXCISION;  Surgeon: Gearlean Alf, MD;  Location: [facility];  Service: Orthopedics;  Laterality: Left;  . KNEE ARTHROTOMY Left 03/05/2014   Procedure:  LEFT KNEE ARTHROTOMY/SCAR EXCISION/POLYETHYLENE REVISION;  Surgeon: Gearlean Alf, MD;  Location: [facility];  Service: Orthopedics;  Laterality: Left;  . KNEE CLOSED REDUCTION Left 02/15/2013   Procedure: CLOSED MANIPULATION LEFT KNEE;  Surgeon: Gearlean Alf, MD;  Location: [facility];  Service: Orthopedics;  Laterality: Left;  . TOTAL HIP ARTHROPLASTY Right 12/24/2015   Procedure: TOTAL HIP ARTHROPLASTY ANTERIOR APPROACH;  Surgeon: Hessie Knows, MD;  Location: [facility];  Service: Orthopedics;  Laterality: Right;  . TOTAL HIP ARTHROPLASTY Left 02/10/2018   Procedure: TOTAL HIP ARTHROPLASTY ANTERIOR APPROACH;  Surgeon: Hessie Knows, MD;  Location: [facility];  Service: Orthopedics;  Laterality: Left;  . TOTAL KNEE REVISION Left 12/07/2012   Procedure:  TOTAL KNEE ARTHROPLASTY REVISION;  Surgeon: Gearlean Alf, MD;  Location: [facility];  Service: Orthopedics;  Laterality: Left;  . TUBAL LIGATION        There were no vitals filed for this visit.  Subjective Assessment - 05/11/18 1411    Subjective  Pt. reports new pain in right little toe early this morning after stubbing it. 7/10 pain with little toe. Some pain and stiffness in left knee 1/10 NPS. Pt. also demonstrated some toe walking during ambulation into gym.     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      Limitations  Walking;House hold activities;Lifting    How long can you stand comfortably?  as soon as standing up    How long can you walk comfortably?  as soon as starts     Diagnostic tests  XRAYS    Patient Stated Goals  Walk without pain/sleep without R hip/knee pain/ Ambulate with no assistive device.      Currently in Pain?  Yes    Pain Score  1     Pain Location  Knee    Pain Orientation  Left;Anterior    Pain Descriptors / Indicators  Aching    Pain Type  Chronic pain            Treatment:  There.ex.:   Nustep L6seat position  1076mn. (warm-up/no charge) TG bilat. 20x, SL 10x R/L (needed more assistance on L due to decr. strength compared to R) Seated bilat. LAQ 4# 3x10 Seated marches 4# 3x10 Weighted high marches 4# in //-bars and side stepping with mirror feedback for correcting posture Standing HS curls 4# 2x10 with mirror feedback Calf raises and toe raises 4# 2x10   Manual tx:  Supine L hip/knee flexion AA/PROM 10x each (groin discomfort reported).   L patellar mobs. (all planes)- grade III     PT Long Term Goals - 05/03/18 0746      PT LONG TERM GOAL #1   Title  Pt will increase L LE muscle strength 1/2 muscle grade to improve L SLR/ gait pattern without assistive device.      Baseline  L/R hip flex. 3+/5 MMT/ 4/5 MMT. Hip abd. 4/5 MMT, 4+/5 MMT. B adduction 5/5 MMT. R/L knee ext. 5/5 MMT, 4/5 MMT. R/L knee flex. 4+/5, 4+/5 MMT.     Time  4    Period  Weeks    Status  Partially Met    Target Date  05/31/18      PT LONG TERM GOAL #2   Title  Pt. will increase L knee flexion AROM to >90 deg. to improve sitting posture/ gait pattern.      Baseline  Seated L knee A/PROM: flexion 68/82 deg./ extension -29 deg (ext. lag).    Time  4    Period  Weeks    Status  Not Met    Target Date  05/31/18      PT LONG TERM GOAL #3   Title  Pt. will increase FOTO score to 56 to improve functional mobility.      Baseline  FOTO: initial 13.  Today: 44 and Goal: 53.      Time  4    Period  Weeks    Status  Partially Met    Target Date  05/31/18      PT LONG TERM GOAL #4   Title  Pt. able to ambulate with with use of SPC and consistent gait pattern to promote greater independence/ mobility.      Baseline  Moderate L antalgic gait pattern with recent progression to B loftstrand crutches.      Time  4    Period  Weeks    Status  Partially Met    Target Date  05/31/18            Plan - 05/11/18 1419    Clinical Impression Statement  Pt. presents with decreased heel strike on L LE but improved with  verbal cueing by  end of session. Pt. also demonstrates decr. knee ext. and DF during gait pattern and standing posture. Pt. had increased pain with high marching in //-bars but with less UE assistance. PT will continue to improve ROM in L knee in order to normalize gait pattern.     Clinical Presentation  Evolving    Clinical Decision Making  Moderate    Rehab Potential  Fair    PT Frequency  2x / week    PT Duration  4 weeks    PT Treatment/Interventions  ADLs/Self Care Home Management;Gait training;Functional mobility training;Therapeutic exercise;Balance training;Therapeutic activities;Neuromuscular re-education;Patient/family education;Manual techniques;Passive range of motion;Cryotherapy;Electrical Stimulation;Moist Heat    PT Next Visit Plan  Continue quad and hip strengthening to improve gait and balance for less reliance on AD. Increase L knee ROM to help progress function and strength.       Patient will benefit from skilled therapeutic intervention in order to improve the following deficits and impairments:  Abnormal gait, Decreased activity tolerance, Decreased balance, Decreased endurance, Decreased range of motion, Difficulty walking, Hypomobility, Decreased strength, Pain, Improper body mechanics, Decreased mobility, Decreased scar mobility  Visit Diagnosis: Chronic pain of left knee  Joint stiffness of knee, left  Muscle weakness (generalized)  Gait difficulty     Problem List Patient Active Problem List   Diagnosis Date Noted  . Status post total hip replacement, left 02/10/2018  . Primary localized osteoarthritis of right hip 12/24/2015  . Arthrofibrosis of knee joint 03/05/2014  . Hypokalemia 05/30/2013  . OA (osteoarthritis) of knee 05/29/2013  . Postoperative stiffness of total knee replacement ([place]) 02/15/2013  . Postoperative anemia due to acute blood loss 12/09/2012  . Failed total knee arthroplasty ([place]) 12/07/2012   Pura Spice, PT, DPT #  [phone] 05/11/2018, 7:04 PM  [place] [facility] [address] [place], [place], [address] Phone: [phone]   Fax:  [phone]  Name: [name] [name] MRN: [id number] Date of Birth: [birth year]

## 2018-05-16 ENCOUNTER — Ambulatory Visit: Payer: Medicare HMO | Admitting: Physical Therapy

## 2018-05-16 ENCOUNTER — Encounter: Payer: Self-pay | Admitting: Physical Therapy

## 2018-05-16 DIAGNOSIS — M25562 Pain in left knee: Principal | ICD-10-CM

## 2018-05-16 DIAGNOSIS — G8929 Other chronic pain: Secondary | ICD-10-CM

## 2018-05-16 DIAGNOSIS — R269 Unspecified abnormalities of gait and mobility: Secondary | ICD-10-CM

## 2018-05-16 DIAGNOSIS — M6281 Muscle weakness (generalized): Secondary | ICD-10-CM

## 2018-05-16 DIAGNOSIS — M25662 Stiffness of left knee, not elsewhere classified: Secondary | ICD-10-CM

## 2018-05-16 NOTE — Therapy (Addendum)
[facility] [address] [place], [place], [address] Phone: [phone]   Fax:  [phone]  Physical Therapy Treatment Physical Therapy Progress Note   Dates of reporting period  04/08/2019   to   05/16/2018    Patient Details  Name: [name] [name] MRN: [id number] Date of Birth: 06-14-1950 Referring Provider (PT): Dr. Rozelle Logan   Encounter Date: 05/16/2018  PT End of Session - 05/16/18 0933    Visit Number  20    Number of Visits  24    Date for PT Re-Evaluation  05/31/18    PT Start Time  0817    PT Stop Time  0912    PT Time Calculation (min)  55 min    Equipment Utilized During Treatment  Gait belt    Activity Tolerance  Patient tolerated treatment well;Patient limited by pain    Behavior During Therapy  [facility] for tasks assessed/performed       Past Medical History:  Diagnosis Date  . Arthritis   . Complication of anesthesia    terrible vertigo  . Hypertension   . PONV (postoperative nausea and vomiting)    related to vertigo  . Shortness of breath    WITH EXERTION  . Vertigo    hx post op    Past Surgical History:  Procedure Laterality Date  . BACK SURGERY  2010   cervical fusion  . BREAST BIOPSY Left 03/31/2011   fibrocystic changes, marked stromal sclerosis, and dense stromal calcifications  . BREAST BIOPSY Left 03/31/2011  . BREAST BIOPSY Right 03/31/2011   fine needle aspiration with clip placement - ultrasound  . BREAST SURGERY Left 2005   biopsy  . CARPAL TUNNEL RELEASE Right   . FOOT SURGERY Left    heel---STATES METAL IN THE HEEL - FOOT TURNS OUTWARD  . HEEL SPUR SURGERY    . HYSTEROSCOPY W/D&C N/A 07/26/2015   Procedure: DILATATION AND CURETTAGE /HYSTEROSCOPY with cervical biopsy;  Surgeon: Benjaman Kindler, MD;  Location: [facility];  Service: Gynecology;  Laterality: N/A;  . JOINT REPLACEMENT Left 2013   knee  . KNEE ARTHROTOMY Left 05/29/2013   Procedure: LEFT KNEE ARTHROTOMY WITH SCAR  EXCISION;  Surgeon: Gearlean Alf, MD;  [facility]: [facility];  Service: Orthopedics;  Laterality: Left;  . KNEE ARTHROTOMY Left 03/05/2014   Procedure: LEFT KNEE ARTHROTOMY/SCAR EXCISION/POLYETHYLENE REVISION;  Surgeon: Gearlean Alf, MD;  Location: [facility];  Service: Orthopedics;  Laterality: Left;  . KNEE CLOSED REDUCTION Left 02/15/2013   Procedure: CLOSED MANIPULATION LEFT KNEE;  Surgeon: Gearlean Alf, MD;  Location: [facility];  Service: Orthopedics;  Laterality: Left;  . TOTAL HIP ARTHROPLASTY Right 12/24/2015   Procedure: TOTAL HIP ARTHROPLASTY ANTERIOR APPROACH;  Surgeon: Hessie Knows, MD;  Location: [facility];  Service: Orthopedics;  Laterality: Right;  . TOTAL HIP ARTHROPLASTY Left 02/10/2018   Procedure: TOTAL HIP ARTHROPLASTY ANTERIOR APPROACH;  Surgeon: Hessie Knows, MD;  Location: [facility];  Service: Orthopedics;  Laterality: Left;  . TOTAL KNEE REVISION Left 12/07/2012   Procedure:  TOTAL KNEE ARTHROPLASTY REVISION;  Surgeon: Gearlean Alf, MD;  Location: [facility];  Service: Orthopedics;  Laterality: Left;  . TUBAL LIGATION      There were no vitals filed for this visit.  Subjective Assessment - 05/16/18 0819    Subjective  Pt. reports knee was hurting up and down the leg with the rainy weather over the weekend. 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 Pt. reports she woke up at 2AM this morning and took an advil. Her pain is 5/10 coming into PT. Pt. reported doing HEP last night with 5# (100 reps).     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      Limitations  Walking;House hold activities;Lifting    How long can you stand comfortably?  as soon as standing up    How long can you walk comfortably?  as soon as starts     Diagnostic tests  XRAYS    Patient Stated Goals  Walk without pain/sleep without R hip/knee pain/ Ambulate with no assistive device.       Currently in Pain?  Yes    Pain Score  5     Pain Location  Knee    Pain Orientation  Left;Anterior    Pain Descriptors / Indicators  Aching    Pain Type  Chronic pain       Treatment:  Ther.ex.:   Nustep L5seat position1284mn. (warm-up/no charge) Resisted (black band) walk in //-bars x10 fwd/bkwd (emphasis on heel strike and posture with verbal and visual cues, PT cued pt. to decrease UE assist) Side stepping in //-bars GTB x4 laps. Patient benefits from verbal and visual cues for maintaining hip in neutral alignment and preventing excessive hip adduction and genu valgum. Seated marches 4# 1x10 (increased knee pain) Seated LAQ 4# 1x10 (increased knee pain) Seated hamstring curls 1x10 with green resistance band. (soreness) Reassessment of ROM and strength   PROM flexion: 87 degrees ext: -17 degrees  AROM flexion: 84 degrees ext: -24 degrees  MMT: hip flexion: R 4+ L 3- (incr. Pain)  knee ext. R 4+ L 3, knee flex. R 5 L 4+    Manual tx:  L patellar mobs. (all planes)- grade III, 30 sec bouts x2 each direction; Patient reports increased discomfort with lateral glides, but has greater patellar mobility s/p mobilizations. Patient has no increased pain after mobilizations.  L patellar superior glide grade IV with TKE isometric in long sitting 5 sec hold x5. Patient had no increased discomfort with mobilizations with movement.     PT Long Term Goals - 05/03/18 0746      PT LONG TERM GOAL #1   Title  Pt will increase L LE muscle strength 1/2 muscle grade to improve L SLR/ gait pattern without assistive device.      Baseline  L/R hip flex. 3+/5 MMT/ 4/5 MMT. Hip abd. 4/5 MMT, 4+/5 MMT. B adduction 5/5 MMT. R/L knee ext. 5/5 MMT, 4/5 MMT. R/L knee flex. 4+/5, 4+/5 MMT.     Time  4    Period  Weeks    Status  Partially Met    Target Date  05/31/18      PT LONG TERM GOAL #2   Title  Pt. will increase L knee flexion AROM to >90 deg. to improve sitting posture/ gait pattern.       Baseline  Seated L knee A/PROM: flexion 68/82 deg./ extension -29 deg (ext. lag).    Time  4    Period  Weeks    Status  Not Met    Target Date  05/31/18      PT LONG TERM GOAL #3   Title  Pt. will increase FOTO score to 56 to improve functional mobility.      Baseline  FOTO: initial 33.  Today: 44 and Goal: 53.      Time  4    Period  Weeks    Status  Partially Met    Target Date  05/31/18      PT LONG TERM GOAL #4   Title  Pt. able to ambulate with with use of SPC and consistent gait pattern to promote greater independence/ mobility.      Baseline  Moderate L antalgic gait pattern with recent progression to B loftstrand crutches.      Time  4    Period  Weeks    Status  Partially Met    Target Date  05/31/18            Plan - 05/16/18 2355    Clinical Impression Statement  Pt. presents with heavy UE assist during walking in all directions in //-bars and decr. knee extension and heel strike. When attempting seated weighted exercise, pt. had increased pain and "stinging" in her knee. Pt. was unable to complete a set of seated LAQ, HS curls and marches with 4#.  Pt. presents with a hypomobile patella and is tender with patellar mobs. Pt. will continue to benefit from skilled PT services to incr. knee ROM to normalize her gait pattern and overall function during work and church activities.    Clinical Presentation  Evolving    Clinical Decision Making  Moderate    Rehab Potential  Fair    PT Frequency  2x / week    PT Duration  4 weeks    PT Treatment/Interventions  ADLs/Self Care Home Management;Gait training;Functional mobility training;Therapeutic exercise;Balance training;Therapeutic activities;Neuromuscular re-education;Patient/family education;Manual techniques;Passive range of motion;Cryotherapy;Electrical Stimulation;Moist Heat    PT Next Visit Plan  Continue quad and hip strengthening to improve gait and balance for less reliance on AD. Increase L knee ROM to help  progress function and strength.    PT Home Exercise Plan  reviewed HEP    Consulted and Agree with Plan of Care  Patient       Patient will benefit from skilled therapeutic intervention in order to improve the following deficits and impairments:  Abnormal gait, Decreased activity tolerance, Decreased balance, Decreased endurance, Decreased range of motion, Difficulty walking, Hypomobility, Decreased strength, Pain, Improper body mechanics, Decreased mobility, Decreased scar mobility  Visit Diagnosis: Chronic pain of left knee  Joint stiffness of knee, left  Muscle weakness (generalized)  Gait difficulty     Problem List Patient Active Problem List   Diagnosis Date Noted  . Status post total hip replacement, left 02/10/2018  . Primary localized osteoarthritis of right hip 12/24/2015  . Arthrofibrosis of knee joint 03/05/2014  . Hypokalemia 05/30/2013  . OA (osteoarthritis) of knee 05/29/2013  . Postoperative stiffness of total knee replacement ([place]) 02/15/2013  . Postoperative anemia due to acute blood loss 12/09/2012  . 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 I have personally read, edited and approve of the note as written.  Myles Gip PT, DPT [id number] 05/16/2018, 9:36 AM  [place] [facility] [address] [place], [place], [address] Phone: [phone]   Fax:  [phone]  Name: [name] [name] MRN: [id number] Date of Birth: [birth year]

## 2018-05-18 ENCOUNTER — Encounter: Payer: Self-pay | Admitting: Physical Therapy

## 2018-05-18 ENCOUNTER — Ambulatory Visit: Payer: Medicare HMO | Admitting: Physical Therapy

## 2018-05-18 DIAGNOSIS — G8929 Other chronic pain: Secondary | ICD-10-CM

## 2018-05-18 DIAGNOSIS — R269 Unspecified abnormalities of gait and mobility: Secondary | ICD-10-CM

## 2018-05-18 DIAGNOSIS — M25562 Pain in left knee: Principal | ICD-10-CM

## 2018-05-18 DIAGNOSIS — M6281 Muscle weakness (generalized): Secondary | ICD-10-CM

## 2018-05-18 DIAGNOSIS — M25662 Stiffness of left knee, not elsewhere classified: Secondary | ICD-10-CM

## 2018-05-18 NOTE — Therapy (Addendum)
[facility] [address] [place], [place], [address] Phone: [phone]   Fax:  [phone]  Physical Therapy Treatment  Patient Details  Name: [name] [name] MRN: [id number] Date of Birth: 07-08-1950 Referring Provider (PT): Dr. Rozelle Logan   Encounter Date: 05/18/2018   Encounter Date: 05/18/2018  PT End of Session - 05/18/18 0827    Visit Number  21    Number of Visits  24    Date for PT Re-Evaluation  05/31/18    PT Start Time  0816    PT Stop Time  0910    PT Time Calculation (min)  54 min    Equipment Utilized During Treatment  Gait belt    Activity Tolerance  Patient tolerated treatment well;Patient limited by pain    Behavior During Therapy  [facility] for tasks assessed/performed       Past Medical History:  Diagnosis Date  . Arthritis   . Complication of anesthesia    terrible vertigo  . Hypertension   . PONV (postoperative nausea and vomiting)    related to vertigo  . Shortness of breath    WITH EXERTION  . Vertigo    hx post op    Past Surgical History:  Procedure Laterality Date  . BACK SURGERY  2010   cervical fusion  . BREAST BIOPSY Left 03/31/2011   fibrocystic changes, marked stromal sclerosis, and dense stromal calcifications  . BREAST BIOPSY Left 03/31/2011  . BREAST BIOPSY Right 03/31/2011   fine needle aspiration with clip placement - ultrasound  . BREAST SURGERY Left 2005   biopsy  . CARPAL TUNNEL RELEASE Right   . FOOT SURGERY Left    heel---STATES METAL IN THE HEEL - FOOT TURNS OUTWARD  . HEEL SPUR SURGERY    . HYSTEROSCOPY W/D&C N/A 07/26/2015   Procedure: DILATATION AND CURETTAGE /HYSTEROSCOPY with cervical biopsy;  Surgeon: Benjaman Kindler, MD;  Location: [facility];  Service: Gynecology;  Laterality: N/A;  . JOINT REPLACEMENT Left 2013   knee  . KNEE ARTHROTOMY Left 05/29/2013   Procedure: LEFT KNEE ARTHROTOMY WITH SCAR EXCISION;  Surgeon: Gearlean Alf, MD;  Location: [facility];  Service:  Orthopedics;  Laterality: Left;  . KNEE ARTHROTOMY Left 03/05/2014   Procedure: LEFT KNEE ARTHROTOMY/SCAR EXCISION/POLYETHYLENE REVISION;  Surgeon: Gearlean Alf, MD;  Location: [facility];  Service: Orthopedics;  Laterality: Left;  . KNEE CLOSED REDUCTION Left 02/15/2013   Procedure: CLOSED MANIPULATION LEFT KNEE;  Surgeon: Gearlean Alf, MD;  Location: [facility];  Service: Orthopedics;  Laterality: Left;  . TOTAL HIP ARTHROPLASTY Right 12/24/2015   Procedure: TOTAL HIP ARTHROPLASTY ANTERIOR APPROACH;  Surgeon: Hessie Knows, MD;  Location: [facility];  Service: Orthopedics;  Laterality: Right;  . TOTAL HIP ARTHROPLASTY Left 02/10/2018   Procedure: TOTAL HIP ARTHROPLASTY ANTERIOR APPROACH;  Surgeon: Hessie Knows, MD;  Location: [facility];  Service: Orthopedics;  Laterality: Left;  . TOTAL KNEE REVISION Left 12/07/2012   Procedure:  TOTAL KNEE ARTHROPLASTY REVISION;  Surgeon: Gearlean Alf, MD;  Location: [facility];  Service: Orthopedics;  Laterality: Left;  . TUBAL LIGATION      There were no vitals filed for this visit.  Subjective Assessment - 05/18/18 0820    Subjective  Pt. reports some soreness following PT session from Monday and stiffness this AM.    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  Limitations  Walking;House hold activities;Lifting    How long can you stand comfortably?  as soon as standing up    How long can you walk comfortably?  as soon as starts     Diagnostic tests  XRAYS    Patient Stated Goals  Walk without pain/sleep without R hip/knee pain/ Ambulate with no assistive device.      Currently in Pain?  Yes    Pain Score  5     Pain Location  Knee    Pain Orientation  Left;Anterior    Pain Descriptors / Indicators  Aching    Pain Type  Chronic pain          Treatment    Ther.ex.:   Prone position trunk extension to elbow on plinth. 3x5 sec holds. Walking gait in //-bars  with min assit. (PT focus on heel to toe gait and upright posture) Resisted (black band) walk in //-bars x10 fwd/side stepping (emphasis on heel strike and upright posture with verbal cues. Bilat seated hip hike with 3# wt. 2x12 Bilat standing hip extension in //-bars with 3# ankle wt. 2x12 (PT cueing to have pt. Maintain upright posture) L terminal knee extension in //-bars with RTB 2x12 Nustep L4seat [id number]. (Cool-down)                Manual tx:  Bilat prone position passive knee flexion stretch 3x20 sec(pt. Tolerated well) Bilat prone position hip extension 3x20 sec (pt. Was reported stiffness and discomfort with first set but improved with progressive sets) L knee extension with sup patellar MWMt. 2x10 (pt. Continued to remain stiff due to surgery)       PT Long Term Goals - 05/16/18 1344      PT LONG TERM GOAL #1   Title  Pt will increase L LE muscle strength 1/2 muscle grade to improve L SLR/ gait pattern without assistive device.      Baseline  L/R hip flex. 3+/5 MMT/ 4/5 MMT. Hip abd. 4/5 MMT, 4+/5 MMT. B adduction 5/5 MMT. R/L knee ext. 5/5 MMT, 4/5 MMT. R/L knee flex. 4+/5, 4+/5 MMT.     Time  4    Period  Weeks    Status  Partially Met    Target Date  05/31/18      PT LONG TERM GOAL #2   Title  Pt. will increase L knee flexion AROM to >90 deg. to improve sitting posture/ gait pattern.      Baseline  Seated L knee A/PROM: flexion 68/82 deg./ extension -29 deg (ext. lag).    Time  4    Period  Weeks    Status  Not Met    Target Date  05/31/18      PT LONG TERM GOAL #3   Title  Pt. will increase FOTO score to 56 to improve functional mobility.      Baseline  FOTO: initial 13.  Today: 44 and Goal: 53.      Time  4    Period  Weeks    Status  Partially Met    Target Date  05/31/18      PT LONG TERM GOAL #4   Title  Pt. able to ambulate with with use of SPC and consistent gait pattern to promote greater independence/ mobility.      Baseline  Moderate  L antalgic gait pattern with recent progression to B loftstrand crutches.      Time  4    Period  Weeks  Status  Partially Met    Target Date  05/31/18            Plan - 05/18/18 1007    Clinical Impression Statement  Pt. presented to the clinic to with antalgic gait and heavy use of assistive device. Pt. tolerated prone extension on plinth with some difficulty initially but gained mobility with time and PT passive assist. Pt. reported some tightness in anterior L knee during single leg stance of walking but was tolerated. Pt. showed improved hip extension with 4# ankle wt. after prior hip extension stretches. Pt. will continue to benefit from skilled PT services to increase knee and hip strength to improve normal heel to toe gait and balance during functional activities at home.    Clinical Presentation  Evolving    Clinical Decision Making  Moderate    Rehab Potential  Fair    PT Frequency  2x / week    PT Duration  4 weeks    PT Treatment/Interventions  ADLs/Self Care Home Management;Gait training;Functional mobility training;Therapeutic exercise;Balance training;Therapeutic activities;Neuromuscular re-education;Patient/family education;Manual techniques;Passive range of motion;Cryotherapy;Electrical Stimulation;Moist Heat    PT Next Visit Plan  Continue quad and hip strengthening to improve gait and balance for less reliance on AD. Increase L knee ROM to help progress function and strength. Continue to progress hip extension to achieve more upright posture while ambulating.     PT Home Exercise Plan  reviewed HEP    Consulted and Agree with Plan of Care  Patient       Patient will benefit from skilled therapeutic intervention in order to improve the following deficits and impairments:  Abnormal gait, Decreased activity tolerance, Decreased balance, Decreased endurance, Decreased range of motion, Difficulty walking, Hypomobility, Decreased strength, Pain, Improper body mechanics,  Decreased mobility, Decreased scar mobility  Visit Diagnosis: Chronic pain of left knee  Joint stiffness of knee, left  Muscle weakness (generalized)  Gait difficulty     Problem List Patient Active Problem List   Diagnosis Date Noted  . Status post total hip replacement, left 02/10/2018  . Primary localized osteoarthritis of right hip 12/24/2015  . Arthrofibrosis of knee joint 03/05/2014  . Hypokalemia 05/30/2013  . OA (osteoarthritis) of knee 05/29/2013  . Postoperative stiffness of total knee replacement ([place]) 02/15/2013  . Postoperative anemia due to acute blood loss 12/09/2012  . Failed total knee arthroplasty ([place]) 12/07/2012   Pura Spice, PT, DPT # [phone] Florentina Jenny, SPT 05/18/2018, 10:52 AM  [facility] [address] [place], [place], [address] Phone: [phone]   Fax:  [phone]  Name: DYNVER CLEMSON MRN: [id number] Date of Birth: [birth year]

## 2018-05-18 NOTE — Therapy (Signed)
[facility] [address] [place], [place], [address] Phone: [phone]   Fax:  [phone]  Physical Therapy Treatment  Patient Details  Name: [name] [name] MRN: [id number] Date of Birth: [birth year] Referring Provider (PT): Dr. Rozelle Logan   Encounter Date: 05/18/2018  PT End of Session - 05/18/18 0827    Visit Number  21    Number of Visits  24    Date for PT Re-Evaluation  05/31/18    PT Start Time  0816    PT Stop Time  0910    PT Time Calculation (min)  54 min    Equipment Utilized During Treatment  Gait belt    Activity Tolerance  Patient tolerated treatment well;Patient limited by pain    Behavior During Therapy  [facility] for tasks assessed/performed       Past Medical History:  Diagnosis Date  . Arthritis   . Complication of anesthesia    terrible vertigo  . Hypertension   . PONV (postoperative nausea and vomiting)    related to vertigo  . Shortness of breath    WITH EXERTION  . Vertigo    hx post op    Past Surgical History:  Procedure Laterality Date  . BACK SURGERY  2010   cervical fusion  . BREAST BIOPSY Left 03/31/2011   fibrocystic changes, marked stromal sclerosis, and dense stromal calcifications  . BREAST BIOPSY Left 03/31/2011  . BREAST BIOPSY Right 03/31/2011   fine needle aspiration with clip placement - ultrasound  . BREAST SURGERY Left 2005   biopsy  . CARPAL TUNNEL RELEASE Right   . FOOT SURGERY Left    heel---STATES METAL IN THE HEEL - FOOT TURNS OUTWARD  . HEEL SPUR SURGERY    . HYSTEROSCOPY W/D&C N/A 07/26/2015   Procedure: DILATATION AND CURETTAGE /HYSTEROSCOPY with cervical biopsy;  Surgeon: Benjaman Kindler, MD;  Location: [facility];  Service: Gynecology;  Laterality: N/A;  . JOINT REPLACEMENT Left 2013   knee  . KNEE ARTHROTOMY Left 05/29/2013   Procedure: LEFT KNEE ARTHROTOMY WITH SCAR EXCISION;  Surgeon: Gearlean Alf, MD;  [facility];  Service: Orthopedics;  Laterality:  Left;  . KNEE ARTHROTOMY Left 03/05/2014   Procedure: LEFT KNEE ARTHROTOMY/SCAR EXCISION/POLYETHYLENE REVISION;  Surgeon: Gearlean Alf, MD;  [facility];  Service: Orthopedics;  Laterality: Left;  . KNEE CLOSED REDUCTION Left 02/15/2013   Procedure: CLOSED MANIPULATION LEFT KNEE;  Surgeon: Gearlean Alf, MD;  [facility]: [facility];  Service: Orthopedics;  Laterality: Left;  . TOTAL HIP ARTHROPLASTY Right 12/24/2015   Procedure: TOTAL HIP ARTHROPLASTY ANTERIOR APPROACH;  Surgeon: Hessie Knows, MD;  Location: [facility];  Service: Orthopedics;  Laterality: Right;  . TOTAL HIP ARTHROPLASTY Left 02/10/2018   Procedure: TOTAL HIP ARTHROPLASTY ANTERIOR APPROACH;  Surgeon: Hessie Knows, MD;  Location: [facility];  Service: Orthopedics;  Laterality: Left;  . TOTAL KNEE REVISION Left 12/07/2012   Procedure:  TOTAL KNEE ARTHROPLASTY REVISION;  Surgeon: Gearlean Alf, MD;  [facility];  Service: Orthopedics;  Laterality: Left;  . TUBAL LIGATION      There were no vitals filed for this visit.  Subjective Assessment - 05/18/18 0820    Subjective  Pt. reports some soreness following PT session from Monday and stiffness this AM.    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      Limitations  Walking;House hold activities;Lifting    How long can you stand comfortably?  as soon as standing up    How long can you walk comfortably?  as soon as starts     Diagnostic tests  XRAYS    Patient Stated Goals  Walk without pain/sleep without R hip/knee pain/ Ambulate with no assistive device.      Currently in Pain?  Yes    Pain Score  5     Pain Location  Knee    Pain Orientation  Left;Anterior    Pain Descriptors / Indicators  Aching    Pain Type  Chronic pain          Treatment    Ther.ex.:   Prone position trunk extension to elbow on plinth. 3x5 sec holds. Walking gait in //-bars with min assit. (PT focus on  heel to toe gait and upright posture) Resisted (black band) walk in //-bars x10 fwd/side stepping (emphasis on heel strike and upright posture with verbal cues. Bilat seated hip hike with 3# wt. 2x12 Bilat standing hip extension in //-bars with 3# ankle wt. 2x12 (PT cueing to have pt. Maintain upright posture) L terminal knee extension in //-bars with RTB 2x12 Nustep L4seat position1219mn. (Cool-down)                Manual tx:  Bilat prone position passive knee flexion stretch 3x20 sec(pt. Tolerated well) Bilat prone position hip extension 3x20 sec (pt. Was reported stiffness and discomfort with first set but improved with progressive sets) L knee extension with sup patellar MWMt. 2x10 (pt. Continued to remain stiff due to surgery)       PT Long Term Goals - 05/16/18 1344      PT LONG TERM GOAL #1   Title  Pt will increase L LE muscle strength 1/2 muscle grade to improve L SLR/ gait pattern without assistive device.      Baseline  L/R hip flex. 3+/5 MMT/ 4/5 MMT. Hip abd. 4/5 MMT, 4+/5 MMT. B adduction 5/5 MMT. R/L knee ext. 5/5 MMT, 4/5 MMT. R/L knee flex. 4+/5, 4+/5 MMT.     Time  4    Period  Weeks    Status  Partially Met    Target Date  05/31/18      PT LONG TERM GOAL #2   Title  Pt. will increase L knee flexion AROM to >90 deg. to improve sitting posture/ gait pattern.      Baseline  Seated L knee A/PROM: flexion 68/82 deg./ extension -29 deg (ext. lag).    Time  4    Period  Weeks    Status  Not Met    Target Date  05/31/18      PT LONG TERM GOAL #3   Title  Pt. will increase FOTO score to 56 to improve functional mobility.      Baseline  FOTO: initial 13.  Today: 44 and Goal: 53.      Time  4    Period  Weeks    Status  Partially Met    Target Date  05/31/18      PT LONG TERM GOAL #4   Title  Pt. able to ambulate with with use of SPC and consistent gait pattern to promote greater independence/ mobility.      Baseline  Moderate L antalgic gait pattern with  recent progression to B loftstrand crutches.      Time  4    Period  Weeks  Status  Partially Met    Target Date  05/31/18            Plan - 05/18/18 1007    Clinical Impression Statement  Pt. presented to the clinic to with antalgic gait and heavy use of assistive device. Pt. tolerated prone extension on plinth with some difficulty initially but gained mobility with time and PT passive assist. Pt. reported some tightness in anterior L knee during single leg stance of walking but was tolerated. Pt. showed improved hip extension with 4# ankle wt. after prior hip extension stretches. Pt. will continue to benefit from skilled PT services to increase knee and hip strength to improve normal heel to toe gait and balance during functional activities at home.    Clinical Presentation  Evolving    Clinical Decision Making  Moderate    Rehab Potential  Fair    PT Frequency  2x / week    PT Duration  4 weeks    PT Treatment/Interventions  ADLs/Self Care Home Management;Gait training;Functional mobility training;Therapeutic exercise;Balance training;Therapeutic activities;Neuromuscular re-education;Patient/family education;Manual techniques;Passive range of motion;Cryotherapy;Electrical Stimulation;Moist Heat    PT Next Visit Plan  Continue quad and hip strengthening to improve gait and balance for less reliance on AD. Increase L knee ROM to help progress function and strength. Continue to progress hip extension to achieve more upright posture while ambulating.     PT Home Exercise Plan  reviewed HEP    Consulted and Agree with Plan of Care  Patient       Patient will benefit from skilled therapeutic intervention in order to improve the following deficits and impairments:  Abnormal gait, Decreased activity tolerance, Decreased balance, Decreased endurance, Decreased range of motion, Difficulty walking, Hypomobility, Decreased strength, Pain, Improper body mechanics, Decreased mobility, Decreased  scar mobility  Visit Diagnosis: Chronic pain of left knee  Joint stiffness of knee, left  Muscle weakness (generalized)  Gait difficulty     Problem List Patient Active Problem List   Diagnosis Date Noted  . Status post total hip replacement, left 02/10/2018  . Primary localized osteoarthritis of right hip 12/24/2015  . Arthrofibrosis of knee joint 03/05/2014  . Hypokalemia 05/30/2013  . OA (osteoarthritis) of knee 05/29/2013  . Postoperative stiffness of total knee replacement ([place]) 02/15/2013  . Postoperative anemia due to acute blood loss 12/09/2012  . 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 [place], [place], [address] Phone: [phone]   Fax:  [phone]  Name: LYNLEE STRATTON MRN: [id number] Date of Birth: 11-01-1950

## 2018-05-23 ENCOUNTER — Encounter: Payer: Self-pay | Admitting: Physical Therapy

## 2018-05-23 ENCOUNTER — Ambulatory Visit: Payer: Medicare HMO | Admitting: Physical Therapy

## 2018-05-23 DIAGNOSIS — M6281 Muscle weakness (generalized): Secondary | ICD-10-CM

## 2018-05-23 DIAGNOSIS — R269 Unspecified abnormalities of gait and mobility: Secondary | ICD-10-CM

## 2018-05-23 DIAGNOSIS — G8929 Other chronic pain: Secondary | ICD-10-CM

## 2018-05-23 DIAGNOSIS — M25662 Stiffness of left knee, not elsewhere classified: Secondary | ICD-10-CM

## 2018-05-23 DIAGNOSIS — M25562 Pain in left knee: Principal | ICD-10-CM

## 2018-05-23 NOTE — Patient Instructions (Signed)
Access Code: [email or web address]  URL: [email or web address]  Date: 05/23/2018  Prepared by: Dorcas Carrow   Exercises  . Supine Bridge with Pelvic Floor Contraction on Swiss Ball - 12 reps - 3 sets - 1x daily - 7x weekly  . Standing 4-Way Leg Reach with Counter Support - 10 reps - 3 sets - 1x daily - 7x weekly  . Standing Hip Extension with Chair - 10 reps - 3 sets - 1x daily - 7x weekly

## 2018-05-23 NOTE — Therapy (Signed)
[place] [facility] [address]. [place], [place], [address] Phone: [phone]   Fax:  [phone]  Physical Therapy Treatment  Patient Details  Name: [name] [name] MRN: [id number] Date of Birth: April 14, 1951 Referring Provider (PT): Dr. Rozelle Logan   Encounter Date: 05/23/2018  PT End of Session - 05/23/18 0829    Visit Number  22    Number of Visits  24    Date for PT Re-Evaluation  05/31/18    PT Start Time  0819    PT Stop Time  0918    PT Time Calculation (min)  59 min    Activity Tolerance  Patient tolerated treatment well;Patient limited by pain    Behavior During Therapy  [facility] for tasks assessed/performed       Past Medical History:  Diagnosis Date  . Arthritis   . Complication of anesthesia    terrible vertigo  . Hypertension   . PONV (postoperative nausea and vomiting)    related to vertigo  . Shortness of breath    WITH EXERTION  . Vertigo    hx post op    Past Surgical History:  Procedure Laterality Date  . BACK SURGERY  2010   cervical fusion  . BREAST BIOPSY Left 03/31/2011   fibrocystic changes, marked stromal sclerosis, and dense stromal calcifications  . BREAST BIOPSY Left 03/31/2011  . BREAST BIOPSY Right 03/31/2011   fine needle aspiration with clip placement - ultrasound  . BREAST SURGERY Left 2005   biopsy  . CARPAL TUNNEL RELEASE Right   . FOOT SURGERY Left    heel---STATES METAL IN THE HEEL - FOOT TURNS OUTWARD  . HEEL SPUR SURGERY    . HYSTEROSCOPY W/D&C N/A 07/26/2015   Procedure: DILATATION AND CURETTAGE /HYSTEROSCOPY with cervical biopsy;  Surgeon: Benjaman Kindler, MD;  Location: [facility];  Service: Gynecology;  Laterality: N/A;  . JOINT REPLACEMENT Left 2013   knee  . KNEE ARTHROTOMY Left 05/29/2013   Procedure: LEFT KNEE ARTHROTOMY WITH SCAR EXCISION;  Surgeon: Gearlean Alf, MD;  Location: [facility];  Service: Orthopedics;  Laterality: Left;  . KNEE ARTHROTOMY Left 03/05/2014   Procedure:  LEFT KNEE ARTHROTOMY/SCAR EXCISION/POLYETHYLENE REVISION;  Surgeon: Gearlean Alf, MD;  Location: [facility];  Service: Orthopedics;  Laterality: Left;  . KNEE CLOSED REDUCTION Left 02/15/2013   Procedure: CLOSED MANIPULATION LEFT KNEE;  Surgeon: Gearlean Alf, MD;  Location: [facility];  Service: Orthopedics;  Laterality: Left;  . TOTAL HIP ARTHROPLASTY Right 12/24/2015   Procedure: TOTAL HIP ARTHROPLASTY ANTERIOR APPROACH;  Surgeon: Hessie Knows, MD;  Location: [facility];  Service: Orthopedics;  Laterality: Right;  . TOTAL HIP ARTHROPLASTY Left 02/10/2018   Procedure: TOTAL HIP ARTHROPLASTY ANTERIOR APPROACH;  Surgeon: Hessie Knows, MD;  Location: [facility];  Service: Orthopedics;  Laterality: Left;  . TOTAL KNEE REVISION Left 12/07/2012   Procedure:  TOTAL KNEE ARTHROPLASTY REVISION;  Surgeon: Gearlean Alf, MD;  Location: [facility];  Service: Orthopedics;  Laterality: Left;  . TUBAL LIGATION      There were no vitals filed for this visit.  Subjective Assessment - 05/23/18 0818    Subjective  Pt. reports her L hip was bothering her over the weekend and feels as if it is getting "stiffer and stiffer." Pt. also reports tingling in her L leg down her ITB to her knee.    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      Limitations  Walking;House hold activities;Lifting    How long can you stand comfortably?  as soon as standing up    How long can you walk comfortably?  as soon as starts     Diagnostic tests  XRAYS    Patient Stated Goals  Walk without pain/sleep without R hip/knee pain/ Ambulate with no assistive device.      Currently in Pain?  Yes    Pain Score  5     Pain Location  Knee    Pain Orientation  Left;Anterior    Pain Descriptors / Indicators  Aching    Pain Type  Chronic pain       Treatment    Ther.ex.:   Nustep L4seat position1275mn for warm up Bilat prone RTB HS curls 15x Supine  bridges (with red bolster) 3x12 TG B squats ith RTB for resisted hip abd.  2x15 TG calf raise 2x15 (cues for knee ext.) Standing hip ext. In //-bars  2x12 (PT cueing for pt. To keep upright posture)  Standing hip abd in //-bars 2x12    Manual tx:  Bilat prone position hip extension 3x20 sec (pt education on importance of hip extension during gait cycle) Bilat prone position passive knee flexion stretch 3x20 sec     PT Long Term Goals - 05/16/18 1344      PT LONG TERM GOAL #1   Title  Pt will increase L LE muscle strength 1/2 muscle grade to improve L SLR/ gait pattern without assistive device.      Baseline  L/R hip flex. 3+/5 MMT/ 4/5 MMT. Hip abd. 4/5 MMT, 4+/5 MMT. B adduction 5/5 MMT. R/L knee ext. 5/5 MMT, 4/5 MMT. R/L knee flex. 4+/5, 4+/5 MMT.     Time  4    Period  Weeks    Status  Partially Met    Target Date  05/31/18      PT LONG TERM GOAL #2   Title  Pt. will increase L knee flexion AROM to >90 deg. to improve sitting posture/ gait pattern.      Baseline  Seated L knee A/PROM: flexion 68/82 deg./ extension -29 deg (ext. lag).    Time  4    Period  Weeks    Status  Not Met    Target Date  05/31/18      PT LONG TERM GOAL #3   Title  Pt. will increase FOTO score to 56 to improve functional mobility.      Baseline  FOTO: initial 13.  Today: 44 and Goal: 53.      Time  4    Period  Weeks    Status  Partially Met    Target Date  05/31/18      PT LONG TERM GOAL #4   Title  Pt. able to ambulate with with use of SPC and consistent gait pattern to promote greater independence/ mobility.      Baseline  Moderate L antalgic gait pattern with recent progression to B loftstrand crutches.      Time  4    Period  Weeks    Status  Partially Met    Target Date  05/31/18         Plan - 05/23/18 1015    Clinical Impression Statement  Pt. entered clinic with slight antalgic gait and toe walking walking on left leg. Pt. responded well to prone extension on plinth. Pt.  reported moderate stretch along anterior hip during PT PROM. PROM with over pressure of knee flexion was also performed with pt. in prone. Pt. performed supine bridges to follow up increased hip extension as well as strengthening hips. Pt. will benefit from skilled PT to incrase hip abductor and quad strength for stronger and safter single leg stance. Skilled PT is also necessary to improve pt. hip extension to normalize gait pattern and incrase step length.     Clinical Presentation  Evolving    Clinical Decision Making  Moderate    Rehab Potential  Fair    PT Frequency  2x / week    PT Duration  4 weeks    PT Treatment/Interventions  ADLs/Self Care Home Management;Gait training;Functional mobility training;Therapeutic exercise;Balance training;Therapeutic activities;Neuromuscular re-education;Patient/family education;Manual techniques;Passive range of motion;Cryotherapy;Electrical Stimulation;Moist Heat    PT Next Visit Plan  Continue quad and hip strengthening to improve gait and balance for less reliance on AD. Increase L knee ROM to help progress function and strength. Continue to progress hip extension to achieve more upright posture while ambulating.     PT Home Exercise Plan  Pt. demonstrated new HEP flawlessly.     Consulted and Agree with Plan of Care  Patient       Patient will benefit from skilled therapeutic intervention in order to improve the following deficits and impairments:  Abnormal gait, Decreased activity tolerance, Decreased balance, Decreased endurance, Decreased range of motion, Difficulty walking, Hypomobility, Decreased strength, Pain, Improper body mechanics, Decreased mobility, Decreased scar mobility  Visit Diagnosis: Chronic pain of left knee  Joint stiffness of knee, left  Muscle weakness (generalized)  Gait difficulty     Problem List Patient Active Problem List   Diagnosis Date Noted  . Status post total hip replacement, left 02/10/2018  . Primary  localized osteoarthritis of right hip 12/24/2015  . Arthrofibrosis of knee joint 03/05/2014  . Hypokalemia 05/30/2013  . OA (osteoarthritis) of knee 05/29/2013  . Postoperative stiffness of total knee replacement ([place]) 02/15/2013  . Postoperative anemia due to acute blood loss 12/09/2012  . Failed total knee arthroplasty ([place]) 12/07/2012   Pura Spice, PT, DPT # [id number] Florentina Jenny, SPT 05/23/2018, 12:40 PM  [facility] [address] [place], [place], [address] Phone: [phone]   Fax:  [phone]  Name: TAMILYN LUPIEN MRN: [id number] Date of Birth: [birth year]

## 2018-05-25 ENCOUNTER — Ambulatory Visit: Payer: Medicare HMO | Admitting: Physical Therapy

## 2018-05-25 DIAGNOSIS — M6281 Muscle weakness (generalized): Secondary | ICD-10-CM

## 2018-05-25 DIAGNOSIS — M25662 Stiffness of left knee, not elsewhere classified: Secondary | ICD-10-CM

## 2018-05-25 DIAGNOSIS — G8929 Other chronic pain: Secondary | ICD-10-CM

## 2018-05-25 DIAGNOSIS — R269 Unspecified abnormalities of gait and mobility: Secondary | ICD-10-CM

## 2018-05-25 DIAGNOSIS — M25562 Pain in left knee: Secondary | ICD-10-CM | POA: Diagnosis not present

## 2018-05-25 NOTE — Therapy (Signed)
[place] [facility] [address] [place], [place], [address] Phone: [phone]   Fax:  [phone]  Physical Therapy Treatment  Patient Details  Name: [name] [name] MRN: [id number] Date of Birth: 06/07/1950 Referring Provider (PT): Dr. Rozelle Logan   Encounter Date: 05/25/2018  PT End of Session - 05/25/18 1303    Visit Number  23    Number of Visits  24    Date for PT Re-Evaluation  05/31/18    PT Start Time  0815    PT Stop Time  0913    PT Time Calculation (min)  58 min    Equipment Utilized During Treatment  Gait belt    Activity Tolerance  Patient tolerated treatment well;Patient limited by pain    Behavior During Therapy  [facility] for tasks assessed/performed       Past Medical History:  Diagnosis Date  . Arthritis   . Complication of anesthesia    terrible vertigo  . Hypertension   . PONV (postoperative nausea and vomiting)    related to vertigo  . Shortness of breath    WITH EXERTION  . Vertigo    hx post op    Past Surgical History:  Procedure Laterality Date  . BACK SURGERY  2010   cervical fusion  . BREAST BIOPSY Left 03/31/2011   fibrocystic changes, marked stromal sclerosis, and dense stromal calcifications  . BREAST BIOPSY Left 03/31/2011  . BREAST BIOPSY Right 03/31/2011   fine needle aspiration with clip placement - ultrasound  . BREAST SURGERY Left 2005   biopsy  . CARPAL TUNNEL RELEASE Right   . FOOT SURGERY Left    heel---STATES METAL IN THE HEEL - FOOT TURNS OUTWARD  . HEEL SPUR SURGERY    . HYSTEROSCOPY W/D&C N/A 07/26/2015   Procedure: DILATATION AND CURETTAGE /HYSTEROSCOPY with cervical biopsy;  Surgeon: Benjaman Kindler, MD;  Location: [facility];  Service: Gynecology;  Laterality: N/A;  . JOINT REPLACEMENT Left 2013   knee  . KNEE ARTHROTOMY Left 05/29/2013   Procedure: LEFT KNEE ARTHROTOMY WITH SCAR EXCISION;  Surgeon: Gearlean Alf, MD;  Location: [facility];  Service: Orthopedics;  Laterality:  Left;  . KNEE ARTHROTOMY Left 03/05/2014   Procedure: LEFT KNEE ARTHROTOMY/SCAR EXCISION/POLYETHYLENE REVISION;  Surgeon: Gearlean Alf, MD;  Location: [facility];  Service: Orthopedics;  Laterality: Left;  . KNEE CLOSED REDUCTION Left 02/15/2013   Procedure: CLOSED MANIPULATION LEFT KNEE;  Surgeon: Gearlean Alf, MD;  Location: [facility];  Service: Orthopedics;  Laterality: Left;  . TOTAL HIP ARTHROPLASTY Right 12/24/2015   Procedure: TOTAL HIP ARTHROPLASTY ANTERIOR APPROACH;  Surgeon: Hessie Knows, MD;  Location: [facility];  Service: Orthopedics;  Laterality: Right;  . TOTAL HIP ARTHROPLASTY Left 02/10/2018   Procedure: TOTAL HIP ARTHROPLASTY ANTERIOR APPROACH;  Surgeon: Hessie Knows, MD;  Location: [facility];  Service: Orthopedics;  Laterality: Left;  . TOTAL KNEE REVISION Left 12/07/2012   Procedure:  TOTAL KNEE ARTHROPLASTY REVISION;  Surgeon: Gearlean Alf, MD;  Location: [facility];  Service: Orthopedics;  Laterality: Left;  . TUBAL LIGATION      There were no vitals filed for this visit.  Subjective Assessment - 05/25/18 0820    Subjective  Pt. reports her L hip was hurting after last vist with PT, but is feeling better now. Pt. states staying busy the last few days and getting around fine.     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      Limitations  Walking;House hold activities;Lifting    How long can you stand comfortably?  as soon as standing up    How long can you walk comfortably?  as soon as starts     Diagnostic tests  XRAYS    Patient Stated Goals  Walk without pain/sleep without R hip/knee pain/ Ambulate with no assistive device.      Currently in Pain?  No/denies      Manual:  Supine PROM of hip and knee in all planes of motions Hamstring stretching with PT  2x30 sec  Ther.ex.:   Nustep L5seat position1273mn. (warm-up/no charge) Walking //-bars (CGA) (min cueing for upright  posture and heel strike) Hip abduction/extension with yellow band 2x10  Lunging with wt. Shift on 8 in plinth  Forward stepping with black band in //-bars 5 laps Bilat sideways stepping with black band in //-bars 5 laps Backwards stepping with black band in //-bars 5 laps  Standing terminal knee extension RTB in //-bars 5 laps TG squats 3x15 with resisted hip abduction YTB     PT Long Term Goals - 05/16/18 1344      PT LONG TERM GOAL #1   Title  Pt will increase L LE muscle strength 1/2 muscle grade to improve L SLR/ gait pattern without assistive device.      Baseline  L/R hip flex. 3+/5 MMT/ 4/5 MMT. Hip abd. 4/5 MMT, 4+/5 MMT. B adduction 5/5 MMT. R/L knee ext. 5/5 MMT, 4/5 MMT. R/L knee flex. 4+/5, 4+/5 MMT.     Time  4    Period  Weeks    Status  Partially Met    Target Date  05/31/18      PT LONG TERM GOAL #2   Title  Pt. will increase L knee flexion AROM to >90 deg. to improve sitting posture/ gait pattern.      Baseline  Seated L knee A/PROM: flexion 68/82 deg./ extension -29 deg (ext. lag).    Time  4    Period  Weeks    Status  Not Met    Target Date  05/31/18      PT LONG TERM GOAL #3   Title  Pt. will increase FOTO score to 56 to improve functional mobility.      Baseline  FOTO: initial 13.  Today: 44 and Goal: 53.      Time  4    Period  Weeks    Status  Partially Met    Target Date  05/31/18      PT LONG TERM GOAL #4   Title  Pt. able to ambulate with with use of SPC and consistent gait pattern to promote greater independence/ mobility.      Baseline  Moderate L antalgic gait pattern with recent progression to B loftstrand crutches.      Time  4    Period  Weeks    Status  Partially Met    Target Date  05/31/18            Plan - 05/25/18 1305    Clinical Impression Statement  Pt. presented to clinic with toe walking and slight antalgic gait with use of bilat loft strands. Pt. Performed resisted walking in //-bars with min verbal assist form PT for  increasing step length and consistent heel strike. Pt. Remains using heavy UE with walking in //-bars secondary to pain on left knee  during single leg stance on the left leg. Pt. Demonstrated constant heel strike and improved step length upon exit of clinic with bilat loft strands. Pt. Will benefit form continual skilled PT to improve hip strength for normalized gait pattern decrease heavy use of UE assist during ambulation.     Clinical Presentation  Evolving    Clinical Decision Making  Moderate    Rehab Potential  Fair    PT Frequency  2x / week    PT Duration  4 weeks    PT Treatment/Interventions  ADLs/Self Care Home Management;Gait training;Functional mobility training;Therapeutic exercise;Balance training;Therapeutic activities;Neuromuscular re-education;Patient/family education;Manual techniques;Passive range of motion;Cryotherapy;Electrical Stimulation;Moist Heat    PT Next Visit Plan  Continue quad and hip strengthening to improve gait and balance for less reliance on AD. Increase L knee ROM to help progress function and strength. Continue to progress hip extension to achieve more upright posture while ambulating.     PT Home Exercise Plan  Pt. demonstrated new HEP flawlessly.     Consulted and Agree with Plan of Care  Patient       Patient will benefit from skilled therapeutic intervention in order to improve the following deficits and impairments:  Abnormal gait, Decreased activity tolerance, Decreased balance, Decreased endurance, Decreased range of motion, Difficulty walking, Hypomobility, Decreased strength, Pain, Improper body mechanics, Decreased mobility, Decreased scar mobility  Visit Diagnosis: Chronic pain of left knee  Joint stiffness of knee, left  Muscle weakness (generalized)  Gait difficulty     Problem List Patient Active Problem List   Diagnosis Date Noted  . Status post total hip replacement, left 02/10/2018  . Primary localized osteoarthritis of right hip  12/24/2015  . Arthrofibrosis of knee joint 03/05/2014  . Hypokalemia 05/30/2013  . OA (osteoarthritis) of knee 05/29/2013  . Postoperative stiffness of total knee replacement ([place]) 02/15/2013  . Postoperative anemia due to acute blood loss 12/09/2012  . Failed total knee arthroplasty ([place]) 12/07/2012   Pura Spice, PT, DPT # [id number] SPT 05/26/2018, 10:49 AM  [place] [facility] [address] [place], [place], [address] Phone: [phone]   Fax:  [phone]  Name: WALTA BELLVILLE MRN: [id number] Date of Birth: 10/11/1950

## 2018-05-26 ENCOUNTER — Encounter: Payer: Self-pay | Admitting: Physical Therapy

## 2018-05-30 ENCOUNTER — Encounter: Payer: Self-pay | Admitting: Physical Therapy

## 2018-05-30 ENCOUNTER — Ambulatory Visit: Payer: Medicare HMO | Admitting: Physical Therapy

## 2018-05-30 DIAGNOSIS — R269 Unspecified abnormalities of gait and mobility: Secondary | ICD-10-CM

## 2018-05-30 DIAGNOSIS — G8929 Other chronic pain: Secondary | ICD-10-CM

## 2018-05-30 DIAGNOSIS — M25662 Stiffness of left knee, not elsewhere classified: Secondary | ICD-10-CM

## 2018-05-30 DIAGNOSIS — M25562 Pain in left knee: Principal | ICD-10-CM

## 2018-05-30 DIAGNOSIS — M6281 Muscle weakness (generalized): Secondary | ICD-10-CM

## 2018-05-30 NOTE — Therapy (Signed)
[facility] [address] [place], [place], [address] Phone: [phone]   Fax:  [phone]  Physical Therapy Treatment/Discharge  Patient Details  Name: [name] [name] MRN: [id number] Date of Birth: 1950-09-22 Referring Provider (PT): Dr. Rozelle Logan   Encounter Date: 05/30/2018  PT End of Session - 05/30/18 1050    Visit Number  24    Number of Visits  24    Date for PT Re-Evaluation  05/31/18    PT Start Time  0816    PT Stop Time  0920    PT Time Calculation (min)  64 min    Equipment Utilized During Treatment  Gait belt    Activity Tolerance  Patient tolerated treatment well;Patient limited by pain    Behavior During Therapy  [facility] for tasks assessed/performed       Past Medical History:  Diagnosis Date  . Arthritis   . Complication of anesthesia    terrible vertigo  . Hypertension   . PONV (postoperative nausea and vomiting)    related to vertigo  . Shortness of breath    WITH EXERTION  . Vertigo    hx post op    Past Surgical History:  Procedure Laterality Date  . BACK SURGERY  2010   cervical fusion  . BREAST BIOPSY Left 03/31/2011   fibrocystic changes, marked stromal sclerosis, and dense stromal calcifications  . BREAST BIOPSY Left 03/31/2011  . BREAST BIOPSY Right 03/31/2011   fine needle aspiration with clip placement - ultrasound  . BREAST SURGERY Left 2005   biopsy  . CARPAL TUNNEL RELEASE Right   . FOOT SURGERY Left    heel---STATES METAL IN THE HEEL - FOOT TURNS OUTWARD  . HEEL SPUR SURGERY    . HYSTEROSCOPY W/D&C N/A 07/26/2015   Procedure: DILATATION AND CURETTAGE /HYSTEROSCOPY with cervical biopsy;  Surgeon: Benjaman Kindler, MD;  Location: [facility];  Service: Gynecology;  Laterality: N/A;  . JOINT REPLACEMENT Left 2013   knee  . KNEE ARTHROTOMY Left 05/29/2013   Procedure: LEFT KNEE ARTHROTOMY WITH SCAR EXCISION;  Surgeon: Gearlean Alf, MD;  Location: [facility];  Service: Orthopedics;   Laterality: Left;  . KNEE ARTHROTOMY Left 03/05/2014   Procedure: LEFT KNEE ARTHROTOMY/SCAR EXCISION/POLYETHYLENE REVISION;  Surgeon: Gearlean Alf, MD;  Location: [facility];  Service: Orthopedics;  Laterality: Left;  . KNEE CLOSED REDUCTION Left 02/15/2013   Procedure: CLOSED MANIPULATION LEFT KNEE;  Surgeon: Gearlean Alf, MD;  Location: [facility];  Service: Orthopedics;  Laterality: Left;  . TOTAL HIP ARTHROPLASTY Right 12/24/2015   Procedure: TOTAL HIP ARTHROPLASTY ANTERIOR APPROACH;  Surgeon: Hessie Knows, MD;  Location: [facility];  Service: Orthopedics;  Laterality: Right;  . TOTAL HIP ARTHROPLASTY Left 02/10/2018   Procedure: TOTAL HIP ARTHROPLASTY ANTERIOR APPROACH;  Surgeon: Hessie Knows, MD;  Location: [facility];  Service: Orthopedics;  Laterality: Left;  . TOTAL KNEE REVISION Left 12/07/2012   Procedure:  TOTAL KNEE ARTHROPLASTY REVISION;  Surgeon: Gearlean Alf, MD;  Location: [facility];  Service: Orthopedics;  Laterality: Left;  . TUBAL LIGATION      There were no vitals filed for this visit.  Subjective Assessment - 05/30/18 1008    Subjective  Pt. Reported increased stiffness due to weather. Pt. Also noted having some pain in her hip recently but was able to tolerated the pain without it affecting her regular activity. PT noted mild toe walking on left leg and decreased hip extension with ambulation.  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  Pt. assists with church related activities on a regular basis.      Limitations  Walking;House hold activities;Lifting    How long can you stand comfortably?  as soon as standing up    How long can you walk comfortably?  as soon as starts     Diagnostic tests  XRAYS    Patient Stated Goals  Walk without pain/sleep without R hip/knee pain/ Ambulate with no assistive device.      Currently in Pain?  Yes    Pain Score  4     Pain Location  Knee    Pain Orientation  Left;Anterior    Pain Descriptors / Indicators  Aching     Pain Type  Chronic pain          Ther.ex.:   Forward and sidestepping in //-bars for warm up 4xlaps (Mod PT cueing to maintain up right posture and proper heel strike) Resisted forward/backwar monster walks in //-bars RTB. 2x4 laps with RTB Bilat forward lunge on 6 in plinth 2x12 (Min PT cueing for pt. To activate quad and decrease UE assit) Left leg single step up on stairs x12 (mod UE assit) TG squats 2x12 TG single leg squats with left leg 1x10 (mod PT assistance needed) TG calf raises 2x12                 Manual tx:  IT band STM in supine position (8 min) PROM of knee flexion with OP with 5 sec hold (PT noted significant joint stiffness) PROM of knee extension with OP with 5 sec hold    Reviewed current HEP in depth    PT Long Term Goals - 05/30/18 1052      PT LONG TERM GOAL #1   Title  Pt will increase L LE muscle strength 1/2 muscle grade to improve L SLR/ gait pattern without assistive device.      Baseline  L/R hip flex. 3+/5 MMT/ 4/5 MMT. Hip abd. 4/5 MMT, 4+/5 MMT. B adduction 5/5 MMT. R/L knee ext. 5/5 MMT, 4/5 MMT. R/L knee flex. 4+/5, 4+/5 MMT.     Time  4    Period  Weeks    Status  Partially Met    Target Date  05/31/18      PT LONG TERM GOAL #2   Title  Pt. will increase L knee flexion AROM to >90 deg. to improve sitting posture/ gait pattern.      Baseline  Seated L knee A/PROM: flexion 68/82 deg./ extension -29 deg (ext. lag).    Time  4    Period  Weeks    Status  Not Met    Target Date  05/31/18      PT LONG TERM GOAL #3   Title  Pt. will increase FOTO score to 56 to improve functional mobility.      Baseline  FOTO: initial 13.  Today: 44 and Goal: 53.      Time  4    Period  Weeks    Status  Partially Met    Target Date  05/31/18      PT LONG TERM GOAL #4   Title  Pt. able to ambulate with with use of SPC and consistent gait pattern to promote greater independence/ mobility.      Baseline  Moderate L antalgic gait pattern with  recent progression to B loftstrand crutches.  Time  4    Period  Weeks    Status  Partially Met    Target Date  05/31/18            Plan - 05/30/18 1051    Clinical Impression Statement  Pt. presented to clinic with left knee stiffness and decreased heel strike with ambulation. Pt. Performed well with resisted monster walks forward and backward with RTB in //-bars with min UE assistance. Pt. Needed mod cueing form PT to maintain proper foot position to activate glute med as well as increase heel strike to normalize gait. With reassessment pt. Demonstrated left knee flexion in supine; AROM 70 degrees, PROM: 78 degrees. Knee extension; AROM lacking 30 degrees, PROM lacking 29 degrees. Pt. Strength; L/R hip flex 4+/5 MMT/ 5/5 MMT. Hip abd 5/5 MMT, 5/5 MMT. Hip IR. 4/5 MMT (painful) 5/5 MMT. Hip ER. 4/5 MMT, 4+/5 MMT. L/R knee ext. 4/5 MMT, 5/5 MMT. Knee flex. 4+/5 MMT, 4+/5 MMT. L/R ankle DF bilat 5/5 MMT. During ambulation, pt. Is limited by pain in hip and knee. Pt. Was educated to self message her IT band tightness. Pt. Was also educated to maintain her HEP upon discharge today as well as maintain her activity level.       Clinical Presentation  Evolving    Clinical Decision Making  Moderate    Rehab Potential  Fair    PT Frequency  2x / week    PT Duration  4 weeks    PT Treatment/Interventions  ADLs/Self Care Home Management;Gait training;Functional mobility training;Therapeutic exercise;Balance training;Therapeutic activities;Neuromuscular re-education;Patient/family education;Manual techniques;Passive range of motion;Cryotherapy;Electrical Stimulation;Moist Heat    PT Next Visit Plan  Discharge    PT Home Exercise Plan  Pt. demonstrated new HEP flawlessly.     Consulted and Agree with Plan of Care  Patient       Patient will benefit from skilled therapeutic intervention in order to improve the following deficits and impairments:  Abnormal gait, Decreased activity tolerance,  Decreased balance, Decreased endurance, Decreased range of motion, Difficulty walking, Hypomobility, Decreased strength, Pain, Improper body mechanics, Decreased mobility, Decreased scar mobility  Visit Diagnosis: Chronic pain of left knee  Joint stiffness of knee, left  Muscle weakness (generalized)  Gait difficulty     Problem List Patient Active Problem List   Diagnosis Date Noted  . Status post total hip replacement, left 02/10/2018  . Primary localized osteoarthritis of right hip 12/24/2015  . Arthrofibrosis of knee joint 03/05/2014  . Hypokalemia 05/30/2013  . OA (osteoarthritis) of knee 05/29/2013  . Postoperative stiffness of total knee replacement ([place]) 02/15/2013  . Postoperative anemia due to acute blood loss 12/09/2012  . Failed total knee arthroplasty ([place]) 12/07/2012   Pura Spice, PT, DPT # [id number], SPT 05/31/2018, 8:32 AM  [place] [facility] [address] [place], [place], [address] Phone: [phone]   Fax:  [phone]  Name: [name] [name] MRN: [id number] Date of Birth: 08-06-1950

## 2019-04-26 ENCOUNTER — Other Ambulatory Visit: Payer: Medicare HMO

## 2019-06-12 ENCOUNTER — Ambulatory Visit: Payer: Medicare HMO | Attending: Internal Medicine

## 2019-06-12 ENCOUNTER — Other Ambulatory Visit: Payer: Self-pay

## 2019-06-12 DIAGNOSIS — Z23 Encounter for immunization: Secondary | ICD-10-CM | POA: Insufficient documentation

## 2019-06-12 NOTE — Progress Notes (Signed)
   Covid-19 Vaccination Clinic  Name:  [name] [name]    MRN: [id number] DOB: 08-23-1950  06/12/2019  [name] [name] was observed post Covid-19 immunization for 15 minutes without incidence. She was provided with Vaccine Information Sheet and instruction to access the V-Safe system.   [name] [name] was instructed to call 911 with any severe reactions post vaccine: Marland Kitchen Difficulty breathing  . Swelling of your face and throat  . A fast heartbeat  . A bad rash all over your body  . Dizziness and weakness    Immunizations Administered    Name Date Dose VIS Date Route   Moderna COVID-19 Vaccine 06/12/2019 12:22 PM 0.5 mL 04/04/2019 Intramuscular   Manufacturer: Moderna   Lot: [id number]   [id number]

## 2019-07-12 ENCOUNTER — Ambulatory Visit: Payer: Medicare HMO | Attending: Internal Medicine

## 2019-07-12 DIAGNOSIS — Z23 Encounter for immunization: Secondary | ICD-10-CM | POA: Insufficient documentation

## 2019-07-12 NOTE — Progress Notes (Signed)
   Covid-19 Vaccination Clinic  Name:  [name] [name]    MRN: [id number] DOB: November 23, 1950  07/12/2019  [name] [name] was observed post Covid-19 immunization for 15 minutes without incident. She was provided with Vaccine Information Sheet and instruction to access the V-Safe system.   [name] [name] was instructed to call 911 with any severe reactions post vaccine: Marland Kitchen Difficulty breathing  . Swelling of face and throat  . A fast heartbeat  . A bad rash all over body  . Dizziness and weakness   Immunizations Administered    Name Date Dose VIS Date Route   Moderna COVID-19 Vaccine 07/12/2019 12:16 PM 0.5 mL 04/04/2019 Intramuscular   Manufacturer: Moderna   Lot: [id number]   [id number]

## 2019-10-18 ENCOUNTER — Other Ambulatory Visit: Payer: Self-pay

## 2019-10-18 DIAGNOSIS — M549 Dorsalgia, unspecified: Secondary | ICD-10-CM | POA: Insufficient documentation

## 2019-10-18 DIAGNOSIS — Z5321 Procedure and treatment not carried out due to patient leaving prior to being seen by health care provider: Secondary | ICD-10-CM | POA: Insufficient documentation

## 2019-10-18 DIAGNOSIS — R1031 Right lower quadrant pain: Secondary | ICD-10-CM | POA: Insufficient documentation

## 2019-10-18 LAB — COMPREHENSIVE METABOLIC PANEL
ALT: 24 U/L (ref 0–44)
AST: 24 U/L (ref 15–41)
Albumin: 3.9 g/dL (ref 3.5–5.0)
Alkaline Phosphatase: 105 U/L (ref 38–126)
Anion gap: 8 (ref 5–15)
BUN: 16 mg/dL (ref 8–23)
CO2: 28 mmol/L (ref 22–32)
Calcium: 9.5 mg/dL (ref 8.9–10.3)
Chloride: 105 mmol/L (ref 98–111)
Creatinine, Ser: 0.8 mg/dL (ref 0.44–1.00)
GFR calc Af Amer: >60 mL/min (ref >60)
GFR calc non Af Amer: >60 mL/min (ref >60)
Glucose, Bld: 83 mg/dL (ref 70–99)
Potassium: 4.3 mmol/L (ref 3.5–5.1)
Sodium: 141 mmol/L (ref 135–145)
Total Bilirubin: 0.7 mg/dL (ref 0.3–1.2)
Total Protein: 7.5 g/dL (ref 6.5–8.1)

## 2019-10-18 LAB — CBC
HCT: 43.3 % (ref 36.0–46.0)
Hemoglobin: 14.3 g/dL (ref 12.0–15.0)
MCH: 28.4 pg (ref 26.0–34.0)
MCHC: 33 g/dL (ref 30.0–36.0)
MCV: 85.9 fL (ref 80.0–100.0)
Platelets: 371 10*3/uL (ref 150–400)
RBC: 5.04 MIL/uL (ref 3.87–5.11)
RDW: 13.8 % (ref 11.5–15.5)
WBC: 8 10*3/uL (ref 4.0–10.5)
nRBC: 0 % (ref 0.0–0.2)

## 2019-10-18 LAB — LIPASE, BLOOD: Lipase: 21 U/L (ref 11–51)

## 2019-10-18 MED ORDER — SODIUM CHLORIDE 0.9% FLUSH: 3.0000 mL | Freq: Once | INTRAVENOUS | Status: DC

## 2019-10-18 NOTE — ED Triage Notes (Signed)
PT to ED c/o lower right sided abd pain that goes around to back, worsened with movement. No urinary sx. Started after eating [employer].

## 2019-10-19 ENCOUNTER — Emergency Department
Admission: EM | Admit: 2019-10-19 | Discharge: 2019-10-19 | Disposition: A | Discharge status: Home / Self Care | Payer: Medicare HMO | Attending: Emergency Medicine | Admitting: Emergency Medicine

## 2019-10-19 NOTE — ED Notes (Signed)
Pt has not returned to lobby 

## 2019-10-19 NOTE — ED Notes (Signed)
Pt noted leaving ED lobby

## 2020-01-02 IMAGING — XA DG HIP (WITH PELVIS) OPERATIVE*L*
2 series · 2 of 2 positions shown · non-contrast
Comparison: None.

CLINICAL DATA: Left hip replacement.

EXAM:
OPERATIVE left HIP (WITH PELVIS IF PERFORMED) 2 VIEWS
TECHNIQUE: Fluoroscopic spot image(s) were submitted for interpretation
post-operatively.

[Series 4: ortho standard · 1 of 1 slices shown (1 of 2)]
[im 1/1]
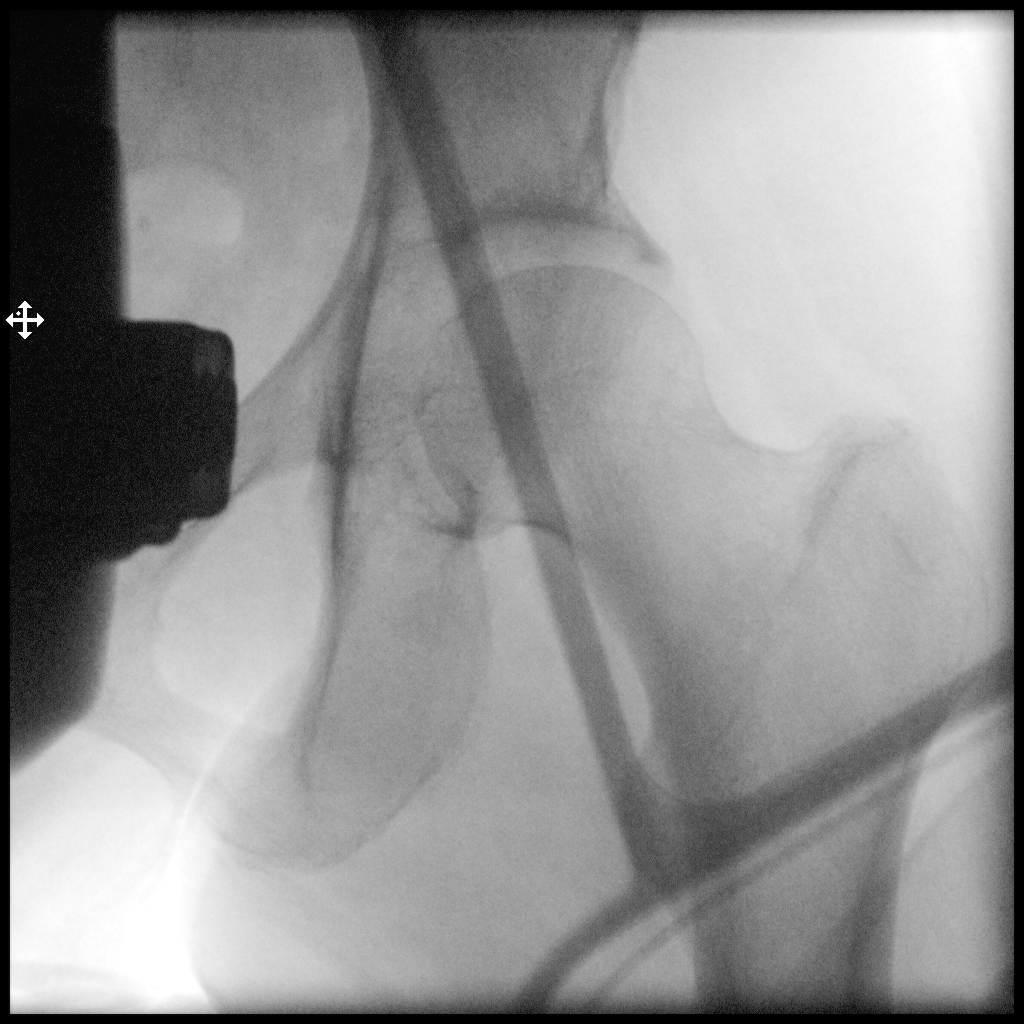

[Series 9: ortho standard · 1 of 1 slices shown (2 of 2)]
[im 1/1]
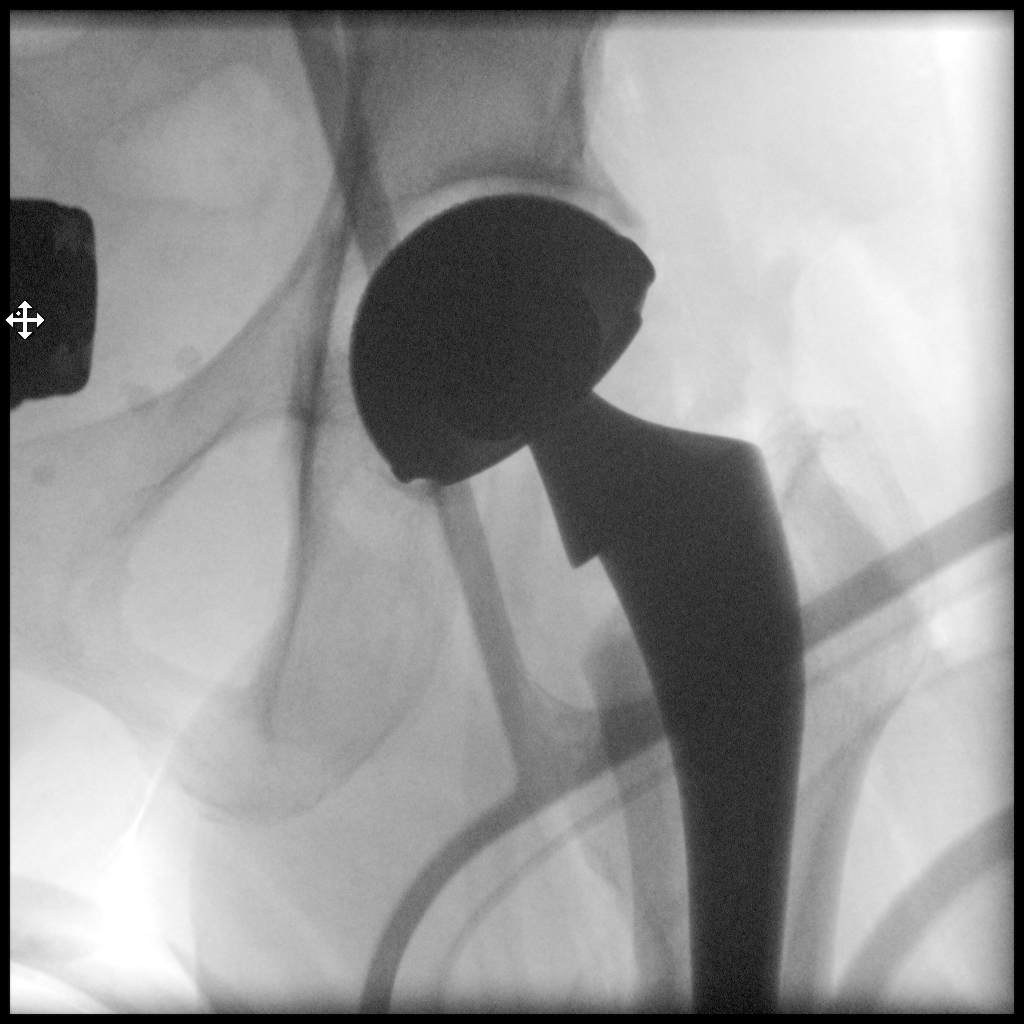

[2 of 2 positions shown; findings below may reference images not displayed]

FINDINGS: Intraoperative images of the hip demonstrate a left hip
hemiarthroplasty. No radiographic complications.
IMPRESSION: Left hip hemiarthroplasty without radiographic complication.

## 2021-04-01 ENCOUNTER — Other Ambulatory Visit: Payer: Self-pay | Admitting: Family Medicine

## 2021-04-01 ENCOUNTER — Other Ambulatory Visit: Payer: Self-pay | Admitting: Internal Medicine

## 2021-04-03 ENCOUNTER — Other Ambulatory Visit: Payer: Self-pay | Admitting: Family Medicine

## 2021-04-03 DIAGNOSIS — N644 Mastodynia: Secondary | ICD-10-CM

## 2021-04-14 ENCOUNTER — Ambulatory Visit
Admission: RE | Admit: 2021-04-14 | Discharge: 2021-04-14 | Disposition: A | Discharge status: Home / Self Care | Payer: Medicare HMO | Source: Ambulatory Visit | Attending: Family Medicine | Admitting: Family Medicine

## 2021-04-14 ENCOUNTER — Other Ambulatory Visit: Payer: Self-pay

## 2021-04-14 DIAGNOSIS — N644 Mastodynia: Secondary | ICD-10-CM | POA: Diagnosis not present

## 2021-04-16 ENCOUNTER — Other Ambulatory Visit: Payer: Self-pay | Admitting: Family Medicine

## 2021-04-16 DIAGNOSIS — R921 Mammographic calcification found on diagnostic imaging of breast: Secondary | ICD-10-CM

## 2021-04-16 DIAGNOSIS — N6489 Other specified disorders of breast: Secondary | ICD-10-CM

## 2021-04-16 DIAGNOSIS — R928 Other abnormal and inconclusive findings on diagnostic imaging of breast: Secondary | ICD-10-CM

## 2021-04-23 ENCOUNTER — Ambulatory Visit
Admission: RE | Admit: 2021-04-23 | Discharge: 2021-04-23 | Disposition: A | Discharge status: Home / Self Care | Payer: Medicare HMO | Source: Ambulatory Visit | Attending: Family Medicine | Admitting: Family Medicine

## 2021-04-23 ENCOUNTER — Other Ambulatory Visit: Payer: Self-pay

## 2021-04-23 DIAGNOSIS — R921 Mammographic calcification found on diagnostic imaging of breast: Secondary | ICD-10-CM

## 2021-04-23 DIAGNOSIS — R928 Other abnormal and inconclusive findings on diagnostic imaging of breast: Secondary | ICD-10-CM | POA: Diagnosis not present

## 2021-04-23 DIAGNOSIS — N6489 Other specified disorders of breast: Secondary | ICD-10-CM

## 2021-04-23 HISTORY — PX: BREAST BIOPSY: SHX20

## 2021-04-24 LAB — SURGICAL PATHOLOGY

## 2021-04-29 ENCOUNTER — Encounter: Payer: Self-pay | Admitting: *Deleted

## 2021-04-29 NOTE — Progress Notes (Signed)
Received message from Electa Sniff, RN from [place] that patient had been notified of her benign breast biopsy, and is aware navigation will call to coordinate her surgical consultation.   Patient's primary care provider is at [facility].  Patient would like to see a surgeon there.  I have scheduled her to see Dr. Lysle Pearl on 05/07/21 @ 9:00.

## 2021-05-07 ENCOUNTER — Other Ambulatory Visit: Payer: Self-pay | Admitting: Surgery

## 2021-05-07 ENCOUNTER — Ambulatory Visit: Payer: Self-pay | Admitting: Surgery

## 2021-05-07 DIAGNOSIS — D242 Benign neoplasm of left breast: Secondary | ICD-10-CM

## 2021-05-07 NOTE — H&P (View-Only) (Signed)
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 female who was referred by Dion Body, MD for evaluation of above. Change was noted on last screening mammogram. Patient does not routinely do self breast exams. Age of menarche was 42.  Patient denies hormonal therapy. Patient is G1P1. Age of first live birth was 30. Patient did not breast feed. Patient denies nipple discharge. Patient reports previous breast biopsy, benign around same spot. Patient denies a personal history of breast cancer.   Past Medical History:  has a past medical history of Allergic state, Arthritis, Borderline diabetes, Hypertension, Shingles, and Tubular adenoma of colon (03/13/2011).   Past Surgical History:  has a past surgical history that includes Knee arthroscopy (Bilateral); Endoscopic Carpal Tunnel Release (09/16/2011); Heel spur removed on right (2006); Anterior cervical diskectomy and fusion C4-5.  Anterior cervical fusion C5-6.  Diskectomy, anterior with removal of posterior osteophytes, C4-5 and C5-6.  Insertion of interbody device Peek spacer C4-5 and C5-6.  Application of anterior cer; LEFT TOTAL KNEE ARTHROPLASTY (04/15/2012); MANIPULATION UNDER ANESTHESIA - LEFT KNEE (06/03/2012); LEFT TOTAL KNEE REVISION ARTHROPLASTY (12/07/2012); MANIPULATION UNDER ANESTHESIA - LEFT KNEE (02/15/2013); LEFT KNEE ARTHROTOMY WITH SCAR EXCISION (05/30/2013); Left knee surgery (03/05/2014); REVISION OF TOTAL KNEE ARTHROPLASTY, WITH OR WITHOUT ALLOGRAFT; FEMORAL AND ENTIRE TIBIAL COMPONENT; Surgeon: Trenton Gammon, MD; Location: [place]; Service: Orthopedics  (Left, 11/30/2014); Dilation and curettage, hysteroscopy any polypectomy (07/26/2015); Total hip arthroplasty anterior approach (Right, 12/24/2015); TOTAL HIP ARTHROPLASTY ANTERIOR APPROACH on 02/10/2018 (Left); Colonoscopy (03/23/2005); Colonoscopy (03/13/2011); and Colonoscopy (06/18/2020).   Family History:  family history includes Alcohol abuse in her father; Cancer in an other family member; Colon cancer in her father; Heart disease in her mother and son; Sickle cell anemia in her sister; Sleep apnea in her son.   Social History:  reports that she has never smoked. She has never used smokeless tobacco. She reports that she does not drink alcohol and does not use drugs.   Current Medications: has a current medication list which includes the following prescription(s): acetaminophen, meclizine, and losartan.   Allergies:  Allergies as of 05/07/2021 - Reviewed 05/07/2021 Allergen Reaction Noted  Grass pollen-bermuda, standard Other (See Comments) 07/10/2013  Hydrocodone Itching 02/05/2016  Oxycodone Dizziness 07/13/2014     ROS:  A 15 point review of systems was performed and was negative except as noted in HPI   Objective:   BP (!) 141/81    Pulse 81    Ht 180.3 cm (5\' 11" )    Wt (!) 116.6 kg (257 lb)    LMP  (LMP Unknown)    BMI 35.84 kg/m    Constitutional :  No distress, cooperative, alert Lymphatics/Throat:  Supple with no lymphadenopathy Respiratory:  Clear to auscultation bilaterally Cardiovascular:  Regular rate and rhythm Gastrointestinal: Soft, non-tender, non-distended, no organomegaly. Musculoskeletal: Steady gait and movement Skin: Cool and moist, no surgical scars Psychiatric: Normal affect, non-agitated, not confused Breast: Normal appearance and no palpable abnormality in bilateral breasts and axilla.  Chaperone present for exam.     LABS:  SURGICAL PATHOLOGY  SURGICAL PATHOLOGY  CASE: [phone]  PATIENT: [facility]  Surgical Pathology Report      Specimen Submitted:  A. Breast, left   Clinical History: X clip in superior central left breast is displaced  inferiorly by approximately 4 cm.  There are landmarks including a  previously placed biopsy clip which could be used for localization if  necessary.  DIAGNOSIS:  A. LEFT BREAST,  SUPERIOR CENTRAL ASYMMETRY AND CALCIFICATIONS;  STEREOTACTIC BIOPSY:  - BENIGN BREAST TISSUE WITH FIBROADENOMATOID CHANGES AND ASSOCIATED  COARSE CALCIFICATIONS.  - NEGATIVE FOR ATYPIA AND MALIGNANCY.    GROSS DESCRIPTION:  A. Labeled: Left breast stereo biopsy superior central asymmetry and  distortion calcs  Received: in a formalin-filled [place] collection device  Specimen radiograph image(s) available for review  Time/Date in fixative: Collected at 9:39 AM on 04/23/2021 and placed in  formalin at 9:41 AM on 04/23/2021  Cold ischemic time: Approximately 2 minutes  Total fixation time: Approximately 7.6 hours  Core pieces: Multiple  Measurement: Aggregate, 1.8 x 1.5 x 0.7 cm  Description / comments: Yellow fragments of fibrofatty tissue.  A  diagram is provided on the specimen container lid and sections A, B, F,  and H are checked  Inked: Green  Entirely submitted in cassette(s):   1-section A  2-section B  3-section F  4-section H  5-sections C and D  6-sections E and G  7-sections I and J   [facility] 04/23/2021   Final Diagnosis performed by Betsy Pries, MD.   Electronically signed  04/24/2021 8:50:20AM  The electronic signature indicates that the named Attending Pathologist  has evaluated the specimen  Technical component performed at [place], [address] [place],  [place] [address] Lab: [phone] Dir: Rush Farmer, MD, MMM   Professional component performed at [facility], [facility], [place], [place], [place] [address] Lab: [phone]  Dir: Kathi Simpers, MD     RADS: CLINICAL DATA:  Intermittent nonfocal stabbing pain in the 9 o'clock  position of the left breast for the past 2 months.   EXAM:  DIGITAL DIAGNOSTIC BILATERAL MAMMOGRAM WITH TOMOSYNTHESIS AND CAD;  ULTRASOUND LEFT BREAST LIMITED   TECHNIQUE:  Bilateral digital diagnostic mammography and breast tomosynthesis  was performed. The images were evaluated with  computer-aided  detection.; Targeted ultrasound examination of the left breast was  performed.   COMPARISON:  Previous exam(s).   ACR Breast Density Category b: There are scattered areas of  fibroglandular density.   FINDINGS:  There is an interval 8 mm bilobed, circumscribed mass in the outer  left breast in the mid to posterior aspect of the breast.   An interval focal area of distortion with mild irregular asymmetry  and small number of microcalcifications is demonstrated in the 12:30  o'clock position of the left breast. This measures approximately 2.6  cm in maximum diameter. There is a top hat shaped biopsy marker clip  in that region of the breast from a stereotactic guided core needle  biopsy performed for calcifications on 03/31/2011 with benign  pathology results   Interval 8 mm oval, circumscribed mass with a small dependent  calcification is demonstrated in the mid to posterior aspect of the  medial left breast in the 8-9 o'clock position.   No findings elsewhere in either breast suspicious for malignancy.   On physical exam, no mass or thickening is palpable in the 12:30  o'clock position of the left breast.   Targeted ultrasound is performed, showing a bilobed cyst with some  internal echoes in the 3 o'clock position of the left breast, 10 cm  from the nipple, corresponding to the bilobed mass seen at  mammography. This contains a small dependent calcification. No  internal blood flow was seen with color Doppler or power Doppler.   A 7 mm cyst containing dependent and non dependent calcification is  demonstrated  in the 9 o'clock position of the left breast, 9 cm from  the nipple, corresponding to the oval mass containing a dependent  calcification at.   No abnormality is visualized in the 12:30 o'clock position the left  breast.   Ultrasound of the left axilla demonstrated appearing left axillary  lymph nodes.   IMPRESSION:  2.6 cm developing asymmetry  with distortion and a small number of  calcifications in the 12:30 o'clock position of the left breast in  the region of a benign stereotactic guided core needle biopsy  performed in 2012.   RECOMMENDATION:  3D stereotactic guided core needle biopsy of the 2.6 cm developing  asymmetry with distortion and calcifications in the 12:30 o'clock  position of the left breast. This has been discussed with the  patient and she is being assisted in getting the biopsy scheduled.   I have discussed the findings and recommendations with the patient.  If applicable, a reminder letter will be sent to the patient  regarding the next appointment.   BI-RADS CATEGORY  4: Suspicious.   Addendum by Tracey Harries, MD on 04/24/2021  6:24 PM EST  ADDENDUM REPORT: 04/24/2021 16:24   ADDENDUM:  PATHOLOGY revealed: A. LEFT BREAST, SUPERIOR CENTRAL ASYMMETRY AND  CALCIFICATIONS; STEREOTACTIC BIOPSY: - BENIGN BREAST TISSUE WITH  FIBROADENOMATOID CHANGES AND ASSOCIATED COARSE CALCIFICATIONS. -  NEGATIVE FOR ATYPIA AND MALIGNANCY.   Pathology results are DISCORDANT with imaging findings, per Dr.  Audie Pinto with excision recommended.   Pathology results and recommendations were discussed with patient  via telephone on 04/24/2021. Patient reported biopsy site doing well  with no adverse symptoms, and only slight tenderness at the site.  Post biopsy care instructions were reviewed, questions were answered  and my direct phone number was provided. Patient was instructed to  call [facility] for any additional questions or concerns  related to biopsy site.   RECOMMENDATIONS: Surgical consultation. Request for surgical  consultation relayed to Al Pimple RN and Tanya Nones RN at  [facility] by Electa Sniff RN on 04/24/2021.   Pathology results reported by Electa Sniff RN on 04/24/2021.        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   Plan:   1. 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 The risks of general anesthetic, if used, includes MI, CVA, sudden death or even reaction to anesthetic medications also discussed.  Typical post-op recovery time and possbility of activity restrictions were also discussed.  Alternatives include continued observation.  Benefits include possible symptom relief, pathologic evaluation, and/or curative excision.    The patient verbalized understanding and all questions were answered to the patient's satisfaction.   2. Patient has elected to proceed with surgical treatment. Procedure will be scheduled.  RF tag prior, no SLNB

## 2021-05-07 NOTE — H&P (Signed)
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 female who was referred by Dion Body, MD for evaluation of above. Change was noted on last screening mammogram. Patient does not routinely do self breast exams. Age of menarche was 85.  Patient denies hormonal therapy. Patient is G1P1. Age of first live birth was 27. Patient did not breast feed. Patient denies nipple discharge. Patient reports previous breast biopsy, benign around same spot. Patient denies a personal history of breast cancer.   Past Medical History:  has a past medical history of Allergic state, Arthritis, Borderline diabetes, Hypertension, Shingles, and Tubular adenoma of colon (03/13/2011).   Past Surgical History:  has a past surgical history that includes Knee arthroscopy (Bilateral); Endoscopic Carpal Tunnel Release (09/16/2011); Heel spur removed on right (2006); Anterior cervical diskectomy and fusion C4-5.  Anterior cervical fusion C5-6.  Diskectomy, anterior with removal of posterior osteophytes, C4-5 and C5-6.  Insertion of interbody device Peek spacer C4-5 and C5-6.  Application of anterior cer; LEFT TOTAL KNEE ARTHROPLASTY (04/15/2012); MANIPULATION UNDER ANESTHESIA - LEFT KNEE (06/03/2012); LEFT TOTAL KNEE REVISION ARTHROPLASTY (12/07/2012); MANIPULATION UNDER ANESTHESIA - LEFT KNEE (02/15/2013); LEFT KNEE ARTHROTOMY WITH SCAR EXCISION (05/30/2013); Left knee surgery (03/05/2014); REVISION OF TOTAL KNEE ARTHROPLASTY, WITH OR WITHOUT ALLOGRAFT; FEMORAL AND ENTIRE TIBIAL COMPONENT; Surgeon: Trenton Gammon, MD; Location: [place] [facility]; Service: Orthopedics  (Left, 11/30/2014); Dilation and curettage, hysteroscopy any polypectomy (07/26/2015); Total hip arthroplasty anterior approach (Right, 12/24/2015); TOTAL HIP ARTHROPLASTY ANTERIOR APPROACH on 02/10/2018 (Left); Colonoscopy (03/23/2005); Colonoscopy (03/13/2011); and Colonoscopy (06/18/2020).   Family History:  family history includes Alcohol abuse in her father; Cancer in an other family member; Colon cancer in her father; Heart disease in her mother and son; Sickle cell anemia in her sister; Sleep apnea in her son.   Social History:  reports that she has never smoked. She has never used smokeless tobacco. She reports that she does not drink alcohol and does not use drugs.   Current Medications: has a current medication list which includes the following prescription(s): acetaminophen, meclizine, and losartan.   Allergies:  Allergies as of 05/07/2021 - Reviewed 05/07/2021 Allergen Reaction Noted  Grass pollen-bermuda, standard Other (See Comments) 07/10/2013  Hydrocodone Itching 02/05/2016  Oxycodone Dizziness 07/13/2014     ROS:  A 15 point review of systems was performed and was negative except as noted in HPI   Objective:   BP (!) 141/81    Pulse 81    Ht 180.3 cm (5\' 11" )    Wt (!) 116.6 kg (257 lb)    LMP  (LMP Unknown)    BMI 35.84 kg/m    Constitutional :  No distress, cooperative, alert Lymphatics/Throat:  Supple with no lymphadenopathy Respiratory:  Clear to auscultation bilaterally Cardiovascular:  Regular rate and rhythm Gastrointestinal: Soft, non-tender, non-distended, no organomegaly. Musculoskeletal: Steady gait and movement Skin: Cool and moist, no surgical scars Psychiatric: Normal affect, non-agitated, not confused Breast: Normal appearance and no palpable abnormality in bilateral breasts and axilla.  Chaperone present for exam.     LABS:  SURGICAL PATHOLOGY  SURGICAL PATHOLOGY  CASE: [phone]  PATIENT: [facility]  Surgical Pathology Report      Specimen Submitted:  A. Breast, left   Clinical History: X clip in superior central left breast is displaced  inferiorly by approximately 4 cm.  There are landmarks including a  previously placed biopsy clip which could be used for localization if  necessary.  DIAGNOSIS:  A. LEFT BREAST,  SUPERIOR CENTRAL ASYMMETRY AND CALCIFICATIONS;  STEREOTACTIC BIOPSY:  - BENIGN BREAST TISSUE WITH FIBROADENOMATOID CHANGES AND ASSOCIATED  COARSE CALCIFICATIONS.  - NEGATIVE FOR ATYPIA AND MALIGNANCY.    GROSS DESCRIPTION:  A. Labeled: Left breast stereo biopsy superior central asymmetry and  distortion calcs  Received: in a formalin-filled [place] collection device  Specimen radiograph image(s) available for review  Time/Date in fixative: Collected at 9:39 AM on 04/23/2021 and placed in  formalin at 9:41 AM on 04/23/2021  Cold ischemic time: Approximately 2 minutes  Total fixation time: Approximately 7.6 hours  Core pieces: Multiple  Measurement: Aggregate, 1.8 x 1.5 x 0.7 cm  Description / comments: Yellow fragments of fibrofatty tissue.  A  diagram is provided on the specimen container lid and sections A, B, F,  and H are checked  Inked: Green  Entirely submitted in cassette(s):   1-section A  2-section B  3-section F  4-section H  5-sections C and D  6-sections E and G  7-sections I and J   [facility] 04/23/2021   Final Diagnosis performed by Betsy Pries, MD.   Electronically signed  04/24/2021 8:50:20AM  The electronic signature indicates that the named Attending Pathologist  has evaluated the specimen  Technical component performed at [facility], [address], [place],  [place] [address] Lab: [phone] Dir: Rush Farmer, MD, MMM   Professional component performed at [facility], [facility], [place], [place], [place] [address] Lab: [phone]  Dir: Kathi Simpers, MD     RADS: CLINICAL DATA:  Intermittent nonfocal stabbing pain in the 9 o'clock  position of the left breast for the past 2 months.   EXAM:  DIGITAL DIAGNOSTIC BILATERAL MAMMOGRAM WITH TOMOSYNTHESIS AND CAD;  ULTRASOUND LEFT BREAST LIMITED   TECHNIQUE:  Bilateral digital diagnostic mammography and breast tomosynthesis  was performed. The images were evaluated with  computer-aided  detection.; Targeted ultrasound examination of the left breast was  performed.   COMPARISON:  Previous exam(s).   ACR Breast Density Category b: There are scattered areas of  fibroglandular density.   FINDINGS:  There is an interval 8 mm bilobed, circumscribed mass in the outer  left breast in the mid to posterior aspect of the breast.   An interval focal area of distortion with mild irregular asymmetry  and small number of microcalcifications is demonstrated in the 12:30  o'clock position of the left breast. This measures approximately 2.6  cm in maximum diameter. There is a top hat shaped biopsy marker clip  in that region of the breast from a stereotactic guided core needle  biopsy performed for calcifications on 03/31/2011 with benign  pathology results   Interval 8 mm oval, circumscribed mass with a small dependent  calcification is demonstrated in the mid to posterior aspect of the  medial left breast in the 8-9 o'clock position.   No findings elsewhere in either breast suspicious for malignancy.   On physical exam, no mass or thickening is palpable in the 12:30  o'clock position of the left breast.   Targeted ultrasound is performed, showing a bilobed cyst with some  internal echoes in the 3 o'clock position of the left breast, 10 cm  from the nipple, corresponding to the bilobed mass seen at  mammography. This contains a small dependent calcification. No  internal blood flow was seen with color Doppler or power Doppler.   A 7 mm cyst containing dependent and non dependent calcification is  demonstrated  in the 9 o'clock position of the left breast, 9 cm from  the nipple, corresponding to the oval mass containing a dependent  calcification at.   No abnormality is visualized in the 12:30 o'clock position the left  breast.   Ultrasound of the left axilla demonstrated appearing left axillary  lymph nodes.   IMPRESSION:  2.6 cm developing asymmetry  with distortion and a small number of  calcifications in the 12:30 o'clock position of the left breast in  the region of a benign stereotactic guided core needle biopsy  performed in 2012.   RECOMMENDATION:  3D stereotactic guided core needle biopsy of the 2.6 cm developing  asymmetry with distortion and calcifications in the 12:30 o'clock  position of the left breast. This has been discussed with the  patient and she is being assisted in getting the biopsy scheduled.   I have discussed the findings and recommendations with the patient.  If applicable, a reminder letter will be sent to the patient  regarding the next appointment.   BI-RADS CATEGORY  4: Suspicious.   Addendum by Tracey Harries, MD on 04/24/2021  6:24 PM EST  ADDENDUM REPORT: 04/24/2021 16:24   ADDENDUM:  PATHOLOGY revealed: A. LEFT BREAST, SUPERIOR CENTRAL ASYMMETRY AND  CALCIFICATIONS; STEREOTACTIC BIOPSY: - BENIGN BREAST TISSUE WITH  FIBROADENOMATOID CHANGES AND ASSOCIATED COARSE CALCIFICATIONS. -  NEGATIVE FOR ATYPIA AND MALIGNANCY.   Pathology results are DISCORDANT with imaging findings, per Dr.  Audie Pinto with excision recommended.   Pathology results and recommendations were discussed with patient  via telephone on 04/24/2021. Patient reported biopsy site doing well  with no adverse symptoms, and only slight tenderness at the site.  Post biopsy care instructions were reviewed, questions were answered  and my direct phone number was provided. Patient was instructed to  call [facility] for any additional questions or concerns  related to biopsy site.   RECOMMENDATIONS: Surgical consultation. Request for surgical  consultation relayed to Al Pimple RN and Tanya Nones RN at  [facility] by Electa Sniff RN on 04/24/2021.   Pathology results reported by Electa Sniff RN on 04/24/2021.        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   Plan:   1. 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 The risks of general anesthetic, if used, includes MI, CVA, sudden death or even reaction to anesthetic medications also discussed.  Typical post-op recovery time and possbility of activity restrictions were also discussed.  Alternatives include continued observation.  Benefits include possible symptom relief, pathologic evaluation, and/or curative excision.    The patient verbalized understanding and all questions were answered to the patient's satisfaction.   2. Patient has elected to proceed with surgical treatment. Procedure will be scheduled.  RF tag prior, no SLNB

## 2021-05-13 ENCOUNTER — Inpatient Hospital Stay: Admission: RE | Admit: 2021-05-13 | Payer: Medicare HMO | Source: Ambulatory Visit

## 2021-05-15 ENCOUNTER — Other Ambulatory Visit: Payer: Self-pay

## 2021-05-15 ENCOUNTER — Ambulatory Visit
Admission: RE | Admit: 2021-05-15 | Discharge: 2021-05-15 | Disposition: A | Discharge status: Home / Self Care | Payer: Medicare HMO | Source: Ambulatory Visit | Attending: Surgery | Admitting: Surgery

## 2021-05-15 DIAGNOSIS — D242 Benign neoplasm of left breast: Secondary | ICD-10-CM | POA: Diagnosis present

## 2021-05-21 MED ORDER — LACTATED RINGERS IV SOLN: INTRAVENOUS | Status: DC

## 2021-05-21 MED ORDER — CHLORHEXIDINE GLUCONATE CLOTH 2 % EX PADS: 6.0000 | MEDICATED_PAD | Freq: Once | CUTANEOUS | Status: DC

## 2021-05-21 MED ORDER — CHLORHEXIDINE GLUCONATE 0.12 % MT SOLN
15.0000 mL | Freq: Once | OROMUCOSAL | Status: AC
  Administered 2021-05-22: 15 mL via OROMUCOSAL

## 2021-05-21 MED ORDER — CEFAZOLIN SODIUM-DEXTROSE 2-4 GM/100ML-% IV SOLN
2.0000 g | INTRAVENOUS | Status: AC
  Administered 2021-05-22: 2 g via INTRAVENOUS

## 2021-05-21 MED ORDER — ORAL CARE MOUTH RINSE: 15.0000 mL | Freq: Once | OROMUCOSAL | Status: AC

## 2021-05-22 ENCOUNTER — Ambulatory Visit: Payer: Medicare HMO | Admitting: Anesthesiology

## 2021-05-22 ENCOUNTER — Other Ambulatory Visit: Payer: Self-pay

## 2021-05-22 ENCOUNTER — Encounter: Payer: Self-pay | Admitting: Surgery

## 2021-05-22 ENCOUNTER — Ambulatory Visit
Admission: RE | Admit: 2021-05-22 | Discharge: 2021-05-22 | Disposition: A | Discharge status: Home / Self Care | Payer: Medicare HMO | Source: Ambulatory Visit | Attending: Surgery | Admitting: Surgery

## 2021-05-22 ENCOUNTER — Encounter
Admission: RE | Disposition: A | Discharge status: Home / Self Care | Payer: Self-pay | Source: Home / Self Care | Attending: Surgery

## 2021-05-22 ENCOUNTER — Ambulatory Visit
Admission: RE | Admit: 2021-05-22 | Discharge: 2021-05-22 | Disposition: A | Discharge status: Home / Self Care | Payer: Medicare HMO | Attending: Surgery | Admitting: Surgery

## 2021-05-22 DIAGNOSIS — N6032 Fibrosclerosis of left breast: Secondary | ICD-10-CM | POA: Diagnosis not present

## 2021-05-22 DIAGNOSIS — N6321 Unspecified lump in the left breast, upper outer quadrant: Secondary | ICD-10-CM

## 2021-05-22 DIAGNOSIS — Z6832 Body mass index (BMI) 32.0-32.9, adult: Secondary | ICD-10-CM | POA: Diagnosis not present

## 2021-05-22 DIAGNOSIS — I10 Essential (primary) hypertension: Secondary | ICD-10-CM | POA: Diagnosis not present

## 2021-05-22 DIAGNOSIS — D242 Benign neoplasm of left breast: Secondary | ICD-10-CM | POA: Insufficient documentation

## 2021-05-22 HISTORY — PX: BREAST LUMPECTOMY WITH RADIO FREQUENCY LOCALIZER: SHX6897

## 2021-05-22 SURGERY — BREAST LUMPECTOMY WITH RADIO FREQUENCY LOCALIZER
Anesthesia: General | Laterality: Left

## 2021-05-22 MED ORDER — TRAMADOL HCL 50 MG PO TABS: 50.0000 mg | ORAL_TABLET | Freq: Four times a day (QID) | ORAL | 0 refills | Status: AC | PRN

## 2021-05-22 MED ORDER — ONDANSETRON HCL 4 MG/2ML IJ SOLN
INTRAMUSCULAR | Status: AC
  Filled 2021-05-22: qty 2

## 2021-05-22 MED ORDER — CHLORHEXIDINE GLUCONATE 0.12 % MT SOLN
OROMUCOSAL | Status: AC
  Filled 2021-05-22: qty 15

## 2021-05-22 MED ORDER — LIDOCAINE HCL (PF) 2 % IJ SOLN
INTRAMUSCULAR | Status: AC
  Filled 2021-05-22: qty 5

## 2021-05-22 MED ORDER — ONDANSETRON HCL 4 MG/2ML IJ SOLN
INTRAMUSCULAR | Status: DC | PRN
Start: 2021-05-22 — End: 2021-05-22
  Administered 2021-05-22: 4 mg via INTRAVENOUS

## 2021-05-22 MED ORDER — FENTANYL CITRATE (PF) 100 MCG/2ML IJ SOLN
INTRAMUSCULAR | Status: DC | PRN
  Administered 2021-05-22: 50 ug via INTRAVENOUS
  Administered 2021-05-22 (×2): 25 ug via INTRAVENOUS

## 2021-05-22 MED ORDER — PROPOFOL 10 MG/ML IV BOLUS
INTRAVENOUS | Status: AC
  Filled 2021-05-22: qty 20

## 2021-05-22 MED ORDER — DEXAMETHASONE SODIUM PHOSPHATE 10 MG/ML IJ SOLN
INTRAMUSCULAR | Status: AC
  Filled 2021-05-22: qty 1

## 2021-05-22 MED ORDER — IBUPROFEN 800 MG PO TABS: 800.0000 mg | ORAL_TABLET | Freq: Three times a day (TID) | ORAL | 0 refills | Status: DC | PRN

## 2021-05-22 MED ORDER — CEFAZOLIN SODIUM-DEXTROSE 2-4 GM/100ML-% IV SOLN
INTRAVENOUS | Status: AC
  Filled 2021-05-22: qty 100

## 2021-05-22 MED ORDER — FENTANYL CITRATE (PF) 100 MCG/2ML IJ SOLN: 25.0000 ug | INTRAMUSCULAR | Status: DC | PRN

## 2021-05-22 MED ORDER — DEXAMETHASONE SODIUM PHOSPHATE 10 MG/ML IJ SOLN
INTRAMUSCULAR | Status: DC | PRN
  Administered 2021-05-22: 5 mg via INTRAVENOUS

## 2021-05-22 MED ORDER — LIDOCAINE HCL (PF) 1 % IJ SOLN
INTRAMUSCULAR | Status: DC | PRN
  Administered 2021-05-22: 18 mL

## 2021-05-22 MED ORDER — LIDOCAINE HCL (PF) 1 % IJ SOLN
INTRAMUSCULAR | Status: AC
  Filled 2021-05-22: qty 30

## 2021-05-22 MED ORDER — PHENYLEPHRINE 40 MCG/ML (10ML) SYRINGE FOR IV PUSH (FOR BLOOD PRESSURE SUPPORT)
PREFILLED_SYRINGE | INTRAVENOUS | Status: DC | PRN
  Administered 2021-05-22: 160 ug via INTRAVENOUS
  Administered 2021-05-22 (×2): 80 ug via INTRAVENOUS
  Administered 2021-05-22: 160 ug via INTRAVENOUS

## 2021-05-22 MED ORDER — ONDANSETRON HCL 4 MG/2ML IJ SOLN: 4.0000 mg | Freq: Once | INTRAMUSCULAR | Status: DC | PRN

## 2021-05-22 MED ORDER — FENTANYL CITRATE (PF) 100 MCG/2ML IJ SOLN
INTRAMUSCULAR | Status: AC
  Filled 2021-05-22: qty 2

## 2021-05-22 MED ORDER — BUPIVACAINE-EPINEPHRINE 0.5% -1:200000 IJ SOLN
INTRAMUSCULAR | Status: DC | PRN
  Administered 2021-05-22: 18 mL

## 2021-05-22 MED ORDER — BUPIVACAINE-EPINEPHRINE (PF) 0.5% -1:200000 IJ SOLN
INTRAMUSCULAR | Status: AC
  Filled 2021-05-22: qty 30

## 2021-05-22 MED ORDER — DOCUSATE SODIUM 100 MG PO CAPS: 100.0000 mg | ORAL_CAPSULE | Freq: Two times a day (BID) | ORAL | 0 refills | Status: AC | PRN

## 2021-05-22 MED ORDER — PROPOFOL 500 MG/50ML IV EMUL
INTRAVENOUS | Status: DC | PRN
  Administered 2021-05-22: 100 ug/kg/min via INTRAVENOUS

## 2021-05-22 MED ORDER — STERILE WATER FOR IRRIGATION IR SOLN
Status: DC | PRN
  Administered 2021-05-22: 100 mL

## 2021-05-22 MED ORDER — PROPOFOL 10 MG/ML IV BOLUS
INTRAVENOUS | Status: DC | PRN
  Administered 2021-05-22: 40 mg via INTRAVENOUS

## 2021-05-22 SURGICAL SUPPLY — 47 items
APPLIER CLIP 11 MED OPEN (CLIP)
BLADE SURG 15 STRL LF DISP TIS (BLADE) ×1 IMPLANT
BLADE SURG 15 STRL SS (BLADE) ×1
CHLORAPREP W/TINT 26 (MISCELLANEOUS) ×2 IMPLANT
CLIP APPLIE 11 MED OPEN (CLIP) IMPLANT
CNTNR SPEC 2.5X3XGRAD LEK (MISCELLANEOUS)
CONT SPEC 4OZ STER OR WHT (MISCELLANEOUS)
CONTAINER SPEC 2.5X3XGRAD LEK (MISCELLANEOUS) ×1 IMPLANT
DERMABOND ADVANCED (GAUZE/BANDAGES/DRESSINGS) ×1
DERMABOND ADVANCED .7 DNX12 (GAUZE/BANDAGES/DRESSINGS) ×1 IMPLANT
DEVICE DSSCT PLSMBLD 3.0S LGHT (MISCELLANEOUS) ×1 IMPLANT
DEVICE DUBIN SPECIMEN MAMMOGRA (MISCELLANEOUS) ×2 IMPLANT
DRAPE LAPAROTOMY TRNSV 106X77 (MISCELLANEOUS) ×2 IMPLANT
ELECT CAUTERY BLADE TIP 2.5 (TIP) ×2
ELECT REM PT RETURN 9FT ADLT (ELECTROSURGICAL) ×2
ELECTRODE CAUTERY BLDE TIP 2.5 (TIP) ×1 IMPLANT
ELECTRODE REM PT RTRN 9FT ADLT (ELECTROSURGICAL) ×1 IMPLANT
GAUZE 4X4 16PLY ~~LOC~~+RFID DBL (SPONGE) ×2 IMPLANT
GLOVE SURG SYN 6.5 ES PF (GLOVE) ×10 IMPLANT
GLOVE SURG SYN 6.5 PF PI (GLOVE) ×1 IMPLANT
GLOVE SURG UNDER POLY LF SZ7 (GLOVE) ×6 IMPLANT
GOWN STRL REUS W/ TWL LRG LVL3 (GOWN DISPOSABLE) ×3 IMPLANT
GOWN STRL REUS W/TWL LRG LVL3 (GOWN DISPOSABLE) ×5
JACKSON PRATT 10 (INSTRUMENTS) IMPLANT
KIT MARKER MARGIN INK (KITS) ×1 IMPLANT
KIT TURNOVER KIT A (KITS) ×2 IMPLANT
LABEL OR SOLS (LABEL) ×2 IMPLANT
LIGHT WAVEGUIDE WIDE FLAT (MISCELLANEOUS) IMPLANT
MANIFOLD NEPTUNE II (INSTRUMENTS) ×2 IMPLANT
MARKER MARGIN CORRECT CLIP (MARKER) ×2 IMPLANT
NEEDLE HYPO 22GX1.5 SAFETY (NEEDLE) ×4 IMPLANT
PACK BASIN MINOR ARMC (MISCELLANEOUS) ×2 IMPLANT
PLASMABLADE 3.0S W/LIGHT (MISCELLANEOUS) ×2
SET LOCALIZER 20 PROBE US (MISCELLANEOUS) ×2 IMPLANT
SUT MNCRL 4-0 (SUTURE) ×2
SUT MNCRL 4-0 27XMFL (SUTURE) ×2
SUT SILK 2 0 (SUTURE)
SUT SILK 2-0 30XBRD TIE 12 (SUTURE) IMPLANT
SUT SILK 3 0 12 30 (SUTURE) IMPLANT
SUT VIC AB 3-0 SH 27 (SUTURE) ×2
SUT VIC AB 3-0 SH 27X BRD (SUTURE) ×2 IMPLANT
SUTURE MNCRL 4-0 27XMF (SUTURE) ×2 IMPLANT
SYR 20ML LL LF (SYRINGE) ×2 IMPLANT
TRAP NEPTUNE SPECIMEN COLLECT (MISCELLANEOUS) ×2 IMPLANT
TUBING CONNECTING 10 (TUBING) ×2 IMPLANT
WATER STERILE IRR 1000ML POUR (IV SOLUTION) ×2 IMPLANT
WATER STERILE IRR 500ML POUR (IV SOLUTION) ×2 IMPLANT

## 2021-05-22 NOTE — Interval H&P Note (Signed)
Ok to proceed. No change

## 2021-05-22 NOTE — Anesthesia Postprocedure Evaluation (Signed)
Anesthesia Post Note  Patient: [name] [name]  Procedure(s) Performed: BREAST LUMPECTOMY WITH RADIO FREQUENCY LOCALIZER (Left)  Patient location during evaluation: PACU Anesthesia Type: General Level of consciousness: awake and alert Pain management: pain level controlled Vital Signs Assessment: post-procedure vital signs reviewed and stable Respiratory status: spontaneous breathing, nonlabored ventilation, respiratory function stable and patient connected to nasal cannula oxygen Cardiovascular status: blood pressure returned to baseline and stable Postop Assessment: no apparent nausea or vomiting Anesthetic complications: no   No notable events documented.   Last Vitals:  Vitals:   05/22/21 1315 05/22/21 1325  BP:  (!) 141/64  Pulse: (!) 44 (!) 53  Resp: 15 16  Temp:  (!) 36.3 C  SpO2: 97% 100%    Last Pain:  Vitals:   05/22/21 1325  TempSrc: Temporal  PainSc: 0-No pain                 Molli Barrows

## 2021-05-22 NOTE — Op Note (Signed)
Preoperative diagnosis:  left breast adenoma  Postoperative diagnosis: same.   Procedure: RF tag-localized left breast lumpectomy  Anesthesia: GETA  Surgeon: Dr. Benjamine Sprague  Wound Classification: Clean  Indications: Patient is a 71 y.o. female with a nonpalpable left breast mass noted on mammography with core biopsy demonstrating discordant fibroadenoma,  requires RF localizer placement, lumpectomy  Specimen: left Breast mass  Complications: None  Estimated Blood Loss: 67mL  Findings: 1. Specimen mammography shows marker and RF localizer on specimen 2. Pathology call refers gross examination of margins was negative 3. No other palpable mass or lymph node identified.   Description of procedure: RF localization was performed by radiology prior to procedure. In the nuclear medicine suite, the subareolar region was injected with Tc-99 sulfur colloid the morning of procedure. Localization studies were reviewed. The patient was taken to the operating room and placed supine on the operating table, and after general anesthesia the left breast and axilla were prepped and draped in the usual sterile fashion. A time-out was completed verifying correct patient, procedure, site, positioning, and implant(s) and/or special equipment prior to beginning this procedure.  By identifying the RF localizer, the probable trajectory and location of the mass was visualized. A skin incision was planned in such a way as to minimize the amount of dissection to reach the mass.  The skin incision was made after infusion of local. Flaps were raised and  Sharp and blunt dissection was then taken down to the mass, taking care to include the entire RF localizer and a margin of grossly normal tissue. The specimen was removed. The specimen was oriented with paint and sent to radiology with the localization studies. Confirmation was received that the entire target lesion had been resected.  Wound irrigated, hemostasis was  achieved and the wound closed in layers with  interrupted sutures of 3-0 Vicryl in deep dermal layer and a running subcuticular suture of Monocryl 4-0, then dressed with dermabond. The patient tolerated the procedure well and was taken to the postanesthesia care unit in stable condition. Sponge and instrument count correct at end of procedure.

## 2021-05-22 NOTE — Discharge Instructions (Addendum)
Removal, Care After This sheet gives you information about how to care for yourself after your procedure. Your health care provider may also give you more specific instructions. If you have problems or questions, contact your health care provider. What can I expect after the procedure? After the procedure, it is common to have: Soreness. Bruising. Itching. Follow these instructions at home: site care Follow instructions from your health care provider about how to take care of your site. Make sure you: Wash your hands with soap and water before and after you change your bandage (dressing). If soap and water are not available, use hand sanitizer. Leave stitches (sutures), skin glue, or adhesive strips in place. These skin closures may need to stay in place for 2 weeks or longer. If adhesive strip edges start to loosen and curl up, you may trim the loose edges. Do not remove adhesive strips completely unless your health care provider tells you to do that. If the area bleeds or bruises, apply gentle pressure for 10 minutes. OK TO SHOWER IN 24HRS  Check your site every day for signs of infection. Check for: Redness, swelling, or pain. Fluid or blood. Warmth. Pus or a bad smell.  General instructions Rest and then return to your normal activities as told by your health care provider.  tylenol and advil as needed for discomfort.  Please alternate between the two every four hours as needed for pain.    Use narcotics, if prescribed, only when tylenol and motrin is not enough to control pain.  325-650mg every 8hrs to max of 3000mg/24hrs (including the 325mg in every norco dose) for the tylenol.    Advil up to 800mg per dose every 8hrs as needed for pain.   Keep all follow-up visits as told by your health care provider. This is important. Contact a health care provider if: You have redness, swelling, or pain around your site. You have fluid or blood coming from your site. Your site feels warm to  the touch. You have pus or a bad smell coming from your site. You have a fever. Your sutures, skin glue, or adhesive strips loosen or come off sooner than expected. Get help right away if: You have bleeding that does not stop with pressure or a dressing. Summary After the procedure, it is common to have some soreness, bruising, and itching at the site. Follow instructions from your health care provider about how to take care of your site. Check your site every day for signs of infection. Contact a health care provider if you have redness, swelling, or pain around your site, or your site feels warm to the touch. Keep all follow-up visits as told by your health care provider. This is important. This information is not intended to replace advice given to you by your health care provider. Make sure you discuss any questions you have with your health care provider. Document Released: 05/17/2015 Document Revised: 10/18/2017 Document Reviewed: 10/18/2017 Elsevier Interactive Patient Education  2019 [employer]  AMBULATORY SURGERY  DISCHARGE INSTRUCTIONS   The drugs that you were given will stay in your system until tomorrow so for the next 24 hours you should not:  Drive an automobile Make any legal decisions Drink any alcoholic beverage   You may resume regular meals tomorrow.  Today it is better to start with liquids and gradually work up to solid foods.  You may eat anything you prefer, but it is better to start with liquids, then soup and crackers, and gradually   work up to solid foods.   Please notify your doctor immediately if you have any unusual bleeding, trouble breathing, redness and pain at the surgery site, drainage, fever, or pain not relieved by medication.    Additional Instructions:        Please contact your physician with any problems or Same Day Surgery at [phone], Monday through Friday 6 am to 4 pm, or [place] [facility] Main number at  [phone]. 

## 2021-05-22 NOTE — Anesthesia Preprocedure Evaluation (Signed)
Anesthesia Evaluation  Patient identified by MRN, date of birth, ID band Patient awake    Reviewed: Allergy & Precautions, H&P , NPO status , Patient's Chart, lab work & pertinent test results, reviewed documented beta blocker date and time   History of Anesthesia Complications (+) PONV and history of anesthetic complications  Airway Mallampati: II   Neck ROM: full    Dental  (+) Poor Dentition   Pulmonary shortness of breath and with exertion,    Pulmonary exam normal        Cardiovascular Exercise Tolerance: Poor hypertension, On Medications negative cardio ROS Normal cardiovascular exam Rhythm:regular Rate:Normal     Neuro/Psych negative neurological ROS  negative psych ROS   GI/Hepatic negative GI ROS, Neg liver ROS,   Endo/Other  Morbid obesity  Renal/GU negative Renal ROS  negative genitourinary   Musculoskeletal   Abdominal   Peds  Hematology  (+) Blood dyscrasia, anemia ,   Anesthesia Other Findings Past Medical History: No date: Arthritis No date: Complication of anesthesia     Comment:  terrible vertigo No date: Hypertension No date: PONV (postoperative nausea and vomiting)     Comment:  related to vertigo No date: Shortness of breath     Comment:  WITH EXERTION No date: Vertigo     Comment:  hx post op Past Surgical History: 2010: BACK SURGERY     Comment:  cervical fusion 03/31/2011: BREAST BIOPSY; Left     Comment:  fibrocystic changes, marked stromal sclerosis, and dense              stromal calcifications 03/31/2011: BREAST BIOPSY; Left 03/31/2011: BREAST BIOPSY; Right     Comment:  fine needle aspiration with clip placement - ultrasound 04/23/2021: BREAST BIOPSY; Left     Comment:  Stereo Bx, X clip, path pending 2005: BREAST SURGERY; Left     Comment:  biopsy No date: CARPAL TUNNEL RELEASE; Right No date: FOOT SURGERY; Left     Comment:  heel---STATES METAL IN THE HEEL - FOOT  TURNS OUTWARD No date: HEEL SPUR SURGERY 07/26/2015: HYSTEROSCOPY WITH D & C; N/A     Comment:  Procedure: DILATATION AND CURETTAGE /HYSTEROSCOPY with               cervical biopsy;  Surgeon: Benjaman Kindler, MD;                Location: [facility];  Service: Gynecology;  Laterality:               N/A; 2013: JOINT REPLACEMENT; Left     Comment:  knee 05/29/2013: KNEE ARTHROTOMY; Left     Comment:  Procedure: LEFT KNEE ARTHROTOMY WITH SCAR EXCISION;                Surgeon: Gearlean Alf, MD;  Location: [facility];                Service: Orthopedics;  Laterality: Left; 03/05/2014: KNEE ARTHROTOMY; Left     Comment:  Procedure: LEFT KNEE ARTHROTOMY/SCAR               EXCISION/POLYETHYLENE REVISION;  Surgeon: Gearlean Alf, MD;  Location: [facility];  Service: Orthopedics;                Laterality: Left; 02/15/2013: KNEE CLOSED REDUCTION; Left     Comment:  Procedure: CLOSED MANIPULATION LEFT KNEE;  Surgeon:  Gearlean Alf, MD;  Location: [facility];  Service:               Orthopedics;  Laterality: Left; 12/24/2015: TOTAL HIP ARTHROPLASTY; Right     Comment:  Procedure: TOTAL HIP ARTHROPLASTY ANTERIOR APPROACH;                Surgeon: Hessie Knows, MD;  Location: [facility];  Service:              Orthopedics;  Laterality: Right; 02/10/2018: TOTAL HIP ARTHROPLASTY; Left     Comment:  Procedure: TOTAL HIP ARTHROPLASTY ANTERIOR APPROACH;                Surgeon: Hessie Knows, MD;  Location: [facility];                Service: Orthopedics;  Laterality: Left; 12/07/2012: TOTAL KNEE REVISION; Left     Comment:  Procedure:  TOTAL KNEE ARTHROPLASTY REVISION;  Surgeon:               Gearlean Alf, MD;  Location: [facility];  Service:               Orthopedics;  Laterality: Left; No date: TUBAL LIGATION BMI    Body Mass Index: 32.92 kg/m     Reproductive/Obstetrics negative OB ROS                             Anesthesia Physical Anesthesia  Plan  ASA: 3  Anesthesia Plan: MAC   Post-op Pain Management:    Induction:   PONV Risk Score and Plan:   Airway Management Planned:   Additional Equipment:   Intra-op Plan:   Post-operative Plan:   Informed Consent: I have reviewed the patients History and Physical, chart, labs and discussed the procedure including the risks, benefits and alternatives for the proposed anesthesia with the patient or authorized representative who has indicated his/her understanding and acceptance.     Dental Advisory Given  Plan Discussed with: CRNA  Anesthesia Plan Comments:         Anesthesia Quick Evaluation

## 2021-05-22 NOTE — Transfer of Care (Signed)
Immediate Anesthesia Transfer of Care Note  Patient: [name] [name]  Procedure(s) Performed: BREAST LUMPECTOMY WITH RADIO FREQUENCY LOCALIZER (Left)  Patient Location: PACU  Anesthesia Type:MAC  Level of Consciousness: awake  Airway & Oxygen Therapy: Patient Spontanous Breathing and on room air  Post-op Assessment: Report given to RN and Post -op Vital signs reviewed and stable  Post vital signs: Reviewed and stable  Last Vitals:  Vitals Value Taken Time  BP    Temp    Pulse 56 05/22/21 1248  Resp 14 05/22/21 1248  SpO2 96 % 05/22/21 1248  Vitals shown include unvalidated device data.  Last Pain:  Vitals:   05/22/21 1049  TempSrc: Oral  PainSc: 0-No pain         Complications: No notable events documented.

## 2021-05-23 LAB — SURGICAL PATHOLOGY

## 2021-08-06 ENCOUNTER — Encounter: Payer: Self-pay | Admitting: Surgery

## 2023-07-02 ENCOUNTER — Emergency Department: Payer: Medicare HMO

## 2023-07-02 ENCOUNTER — Other Ambulatory Visit: Payer: Self-pay

## 2023-07-02 ENCOUNTER — Emergency Department
Admission: EM | Admit: 2023-07-02 | Discharge: 2023-07-02 | Disposition: A | Discharge status: Home / Self Care | Payer: Medicare HMO | Attending: Emergency Medicine | Admitting: Emergency Medicine

## 2023-07-02 DIAGNOSIS — N390 Urinary tract infection, site not specified: Secondary | ICD-10-CM | POA: Diagnosis not present

## 2023-07-02 DIAGNOSIS — K5900 Constipation, unspecified: Secondary | ICD-10-CM | POA: Insufficient documentation

## 2023-07-02 DIAGNOSIS — R1032 Left lower quadrant pain: Secondary | ICD-10-CM | POA: Diagnosis present

## 2023-07-02 LAB — TROPONIN I (HIGH SENSITIVITY): Troponin I (High Sensitivity): 4 ng/L (ref <18)

## 2023-07-02 LAB — LIPASE, BLOOD: Lipase: 19 U/L (ref 11–51)

## 2023-07-02 LAB — BASIC METABOLIC PANEL
Anion gap: 8 (ref 5–15)
BUN: 13 mg/dL (ref 8–23)
CO2: 27 mmol/L (ref 22–32)
Calcium: 9.2 mg/dL (ref 8.9–10.3)
Chloride: 105 mmol/L (ref 98–111)
Creatinine, Ser: 0.77 mg/dL (ref 0.44–1.00)
GFR, Estimated: >60 mL/min (ref >60)
Glucose, Bld: 100 mg/dL — ABNORMAL HIGH (ref 70–99)
Potassium: 4.1 mmol/L (ref 3.5–5.1)
Sodium: 140 mmol/L (ref 135–145)

## 2023-07-02 LAB — URINALYSIS, ROUTINE W REFLEX MICROSCOPIC
Bilirubin Urine: NEGATIVE
Glucose, UA: NEGATIVE mg/dL
Ketones, ur: NEGATIVE mg/dL
Nitrite: NEGATIVE
Protein, ur: NEGATIVE mg/dL
Specific Gravity, Urine: 1.015 (ref 1.005–1.030)
WBC, UA: >50 WBC/hpf (ref 0–5)
pH: 5 (ref 5.0–8.0)

## 2023-07-02 LAB — HEPATIC FUNCTION PANEL
ALT: 25 U/L (ref 0–44)
AST: 23 U/L (ref 15–41)
Albumin: 3.5 g/dL (ref 3.5–5.0)
Alkaline Phosphatase: 101 U/L (ref 38–126)
Bilirubin, Direct: 0.1 mg/dL (ref 0.0–0.2)
Indirect Bilirubin: 0.6 mg/dL (ref 0.3–0.9)
Total Bilirubin: 0.7 mg/dL (ref 0.0–1.2)
Total Protein: 7.2 g/dL (ref 6.5–8.1)

## 2023-07-02 LAB — CBC
HCT: 41.8 % (ref 36.0–46.0)
Hemoglobin: 13.5 g/dL (ref 12.0–15.0)
MCH: 28.7 pg (ref 26.0–34.0)
MCHC: 32.3 g/dL (ref 30.0–36.0)
MCV: 88.7 fL (ref 80.0–100.0)
Platelets: 339 10*3/uL (ref 150–400)
RBC: 4.71 MIL/uL (ref 3.87–5.11)
RDW: 14 % (ref 11.5–15.5)
WBC: 9.4 10*3/uL (ref 4.0–10.5)
nRBC: 0 % (ref 0.0–0.2)

## 2023-07-02 MED ORDER — ACETAMINOPHEN 500 MG PO TABS
1000.0000 mg | ORAL_TABLET | Freq: Once | ORAL | Status: AC
Start: 2023-07-02 — End: 2023-07-02
  Administered 2023-07-02: 1000 mg via ORAL
  Filled 2023-07-02: qty 2

## 2023-07-02 MED ORDER — KETOROLAC TROMETHAMINE 15 MG/ML IJ SOLN
15.0000 mg | Freq: Once | INTRAMUSCULAR | Status: AC
  Administered 2023-07-02: 15 mg via INTRAVENOUS
  Filled 2023-07-02: qty 1

## 2023-07-02 MED ORDER — PANTOPRAZOLE SODIUM 40 MG IV SOLR
40.0000 mg | Freq: Once | INTRAVENOUS | Status: AC
  Administered 2023-07-02: 40 mg via INTRAVENOUS
  Filled 2023-07-02: qty 10

## 2023-07-02 MED ORDER — IOHEXOL 300 MG/ML  SOLN
100.0000 mL | Freq: Once | INTRAMUSCULAR | Status: AC | PRN
  Administered 2023-07-02: 100 mL via INTRAVENOUS

## 2023-07-02 MED ORDER — CEPHALEXIN 500 MG PO CAPS: 500.0000 mg | ORAL_CAPSULE | Freq: Two times a day (BID) | ORAL | 0 refills | Status: AC

## 2023-07-02 MED ORDER — LIDOCAINE 5 % EX PTCH
1.0000 | MEDICATED_PATCH | CUTANEOUS | Status: DC
  Administered 2023-07-02: 1 via TRANSDERMAL
  Filled 2023-07-02: qty 1

## 2023-07-02 NOTE — Discharge Instructions (Addendum)
 Your CT scan is concerning for constipation and gas and I suspect that this is causing some of your discomfort.  You should take MiraLAX.  You can take 2-3 capfuls when you return home to hot try to help things move.  If you do not have any bowel movement tonight then take another to 2 capfuls this morning.  We also given you antibiotics for UTI.  You should return to the ER if develop worsening symptoms or any other concerns  Your blood pressure was elevated and you should have this rechecked with your primary doctor   IMPRESSION:  1. No acute abnormality.  2. Sigmoid colon diverticulosis.  3. Prominent stool in the right colon and prominent gas in the  transverse colon.  4. Diffuse hepatic steatosis.  5. Small umbilical hernia containing fat.

## 2023-07-02 NOTE — ED Notes (Addendum)
 Pt requesting more pain meds for her stomach, Funke MD made aware via secure chat

## 2023-07-02 NOTE — ED Triage Notes (Signed)
 Flank pain x 1 day. Denies dysuria, N/V  AAOx3.  Skin warm and dry. NAD

## 2023-07-02 NOTE — ED Provider Notes (Signed)
 [facility] Provider Note    Event Date/Time   First MD Initiated Contact with Patient 07/02/23 1109     (approximate)   History   Flank Pain   HPI  [name] [name] is a 73 y.o. female who comes in with left lower quadrant pain over the past 1 day.  Patient reports that her pain initially started on her left hip but now has radiated up into her upper abdomen since today.  Her pain is severe, constant, nothing is made it better or worse.  Reports taking Tylenol yesterday.  Denies any nausea vomiting diarrhea.  No overt chest pain, shortness of breath     Physical Exam   Triage Vital Signs: ED Triage Vitals  Encounter Vitals Group     BP 07/02/23 1007 (!) 148/91     Systolic BP Percentile --      Diastolic BP Percentile --      Pulse Rate 07/02/23 1007 (!) 56     Resp 07/02/23 1007 16     Temp 07/02/23 1007 97.7 F (36.5 C)     Temp Source 07/02/23 1007 Oral     SpO2 07/02/23 1007 100 %     Weight 07/02/23 1006 235 lb 14.3 oz (107 kg)     Height --      Head Circumference --      Peak Flow --      Pain Score 07/02/23 1006 7     Pain Loc --      Pain Education --      Exclude from Growth Chart --     Most recent vital signs: Vitals:   07/02/23 1007  BP: (!) 148/91  Pulse: (!) 56  Resp: 16  Temp: 97.7 F (36.5 C)  SpO2: 100%     General: Awake, no distress.  CV:  Good peripheral perfusion.  Resp:  Normal effort.  Abd:  Patient reports pain in her abdomen but she is nontender with palpation.  She has no rebound, no guarding. Other:     ED Results / Procedures / Treatments   Labs (all labs ordered are listed, but only abnormal results are displayed) Labs Reviewed  BASIC METABOLIC PANEL - Abnormal; Notable for the following components:      Result Value   Glucose, Bld 100 (*)    All other components within normal limits  CBC  URINALYSIS, ROUTINE W REFLEX MICROSCOPIC     EKG  My interpretation of EKG:  Sinus  bradycardia rate of 47 without any ST elevation or T wave inversions but there is a lot of artifact  Repeat EKG is rate of 65 without any ST elevation or T wave inversions normal intervals  RADIOLOGY I have reviewed the CT personally interpreted no evidence of kidney stones   PROCEDURES:  Critical Care performed: No  Procedures   MEDICATIONS ORDERED IN ED: Medications - No data to display   IMPRESSION / MDM / ASSESSMENT AND PLAN / ED COURSE  I reviewed the triage vital signs and the nursing notes.   Patient's presentation is most consistent with acute presentation with potential threat to life or bodily function.   Patient comes in with abdominal pain diffusely.  States it comes in waves.  CT imaging ordered evaluate for other acute pathology such as diverticulitis, appendicitis, perforation.  Patient is hypertensive I suspect related to discomfort.  Will have patient follow this up with her PCP.  Patient is slightly bradycardic but I repeated  an EKG and it is sinus in nature.  CBC is reassuring.  BMP reassuring.  Troponin was negative.  Urine looks concerning for UTI.  Will send for urine culture.  IMPRESSION: 1. No acute abnormality. 2. Sigmoid colon diverticulosis. 3. Prominent stool in the right colon and prominent gas in the transverse colon. 4. Diffuse hepatic steatosis. 5. Small umbilical hernia containing fat.  Reevaluated patient and she still states that she still feeling some waves.  Discussed this probably from some constipation in the stool.  Nothing that looks life-threatening.  Patient does have urine that looks concerning for UTI and given some abdominal discomfort will treat for UTI with antibiotics.  We discussed MiraLAX for constipation and following up with PCP.  She expressed understanding felt comfortable with plan  The patient is on the cardiac monitor to evaluate for evidence of arrhythmia and/or significant heart rate changes.      FINAL CLINICAL  IMPRESSION(S) / ED DIAGNOSES   Final diagnoses:  Constipation, unspecified constipation type  Urinary tract infection without hematuria, site unspecified     Rx / DC Orders   ED Discharge Orders     None        Note:  This document was prepared using Dragon voice recognition software and may include unintentional dictation errors.   Concha Se, MD 07/02/23 [phone]

## 2023-07-04 ENCOUNTER — Emergency Department

## 2023-07-04 ENCOUNTER — Other Ambulatory Visit: Payer: Self-pay

## 2023-07-04 ENCOUNTER — Emergency Department
Admission: EM | Admit: 2023-07-04 | Discharge: 2023-07-04 | Disposition: A | Discharge status: Home / Self Care | Attending: Emergency Medicine | Admitting: Emergency Medicine

## 2023-07-04 DIAGNOSIS — N939 Abnormal uterine and vaginal bleeding, unspecified: Secondary | ICD-10-CM | POA: Diagnosis present

## 2023-07-04 DIAGNOSIS — N8500 Endometrial hyperplasia, unspecified: Secondary | ICD-10-CM | POA: Insufficient documentation

## 2023-07-04 DIAGNOSIS — I10 Essential (primary) hypertension: Secondary | ICD-10-CM | POA: Diagnosis not present

## 2023-07-04 DIAGNOSIS — R1032 Left lower quadrant pain: Secondary | ICD-10-CM | POA: Diagnosis not present

## 2023-07-04 DIAGNOSIS — N950 Postmenopausal bleeding: Secondary | ICD-10-CM

## 2023-07-04 DIAGNOSIS — R9389 Abnormal findings on diagnostic imaging of other specified body structures: Secondary | ICD-10-CM

## 2023-07-04 HISTORY — DX: Postmenopausal bleeding: N95.0

## 2023-07-04 LAB — BASIC METABOLIC PANEL
Anion gap: 8 (ref 5–15)
BUN: 14 mg/dL (ref 8–23)
CO2: 27 mmol/L (ref 22–32)
Calcium: 9.2 mg/dL (ref 8.9–10.3)
Chloride: 105 mmol/L (ref 98–111)
Creatinine, Ser: 0.79 mg/dL (ref 0.44–1.00)
GFR, Estimated: >60 mL/min (ref >60)
Glucose, Bld: 92 mg/dL (ref 70–99)
Potassium: 4.5 mmol/L (ref 3.5–5.1)
Sodium: 140 mmol/L (ref 135–145)

## 2023-07-04 LAB — HEPATIC FUNCTION PANEL
ALT: 27 U/L (ref 0–44)
AST: 28 U/L (ref 15–41)
Albumin: 3.6 g/dL (ref 3.5–5.0)
Alkaline Phosphatase: 101 U/L (ref 38–126)
Bilirubin, Direct: 0.1 mg/dL (ref 0.0–0.2)
Indirect Bilirubin: 0.7 mg/dL (ref 0.3–0.9)
Total Bilirubin: 0.8 mg/dL (ref 0.0–1.2)
Total Protein: 7.3 g/dL (ref 6.5–8.1)

## 2023-07-04 LAB — URINALYSIS, W/ REFLEX TO CULTURE (INFECTION SUSPECTED)
Bacteria, UA: NONE SEEN
Bilirubin Urine: NEGATIVE
Glucose, UA: NEGATIVE mg/dL
Ketones, ur: NEGATIVE mg/dL
Nitrite: NEGATIVE
Protein, ur: NEGATIVE mg/dL
Specific Gravity, Urine: 1.043 — ABNORMAL HIGH (ref 1.005–1.030)
Squamous Epithelial / HPF: 0 /HPF (ref 0–5)
pH: 7 (ref 5.0–8.0)

## 2023-07-04 LAB — CBC
HCT: 42.3 % (ref 36.0–46.0)
Hemoglobin: 13.7 g/dL (ref 12.0–15.0)
MCH: 28.6 pg (ref 26.0–34.0)
MCHC: 32.4 g/dL (ref 30.0–36.0)
MCV: 88.3 fL (ref 80.0–100.0)
Platelets: 343 10*3/uL (ref 150–400)
RBC: 4.79 MIL/uL (ref 3.87–5.11)
RDW: 13.8 % (ref 11.5–15.5)
WBC: 8.3 10*3/uL (ref 4.0–10.5)
nRBC: 0 % (ref 0.0–0.2)

## 2023-07-04 LAB — URINE CULTURE: Culture: 50000 — AB

## 2023-07-04 MED ORDER — IOHEXOL 300 MG/ML  SOLN
75.0000 mL | Freq: Once | INTRAMUSCULAR | Status: AC | PRN
  Administered 2023-07-04: 75 mL via INTRAVENOUS

## 2023-07-04 NOTE — ED Notes (Signed)
 See triage notes. Patient c/o blood in her urine and left lower abdominal pain. No other urinary symptoms. Patient was seen on Friday here. Patient also states that she has not had a bowel movement since this past Wednesday.

## 2023-07-04 NOTE — Discharge Instructions (Addendum)
 Please make sure to take the MiraLAX daily for your constipation until you are stooling regularly.  Please follow-up with OB/GYN for further management of your vaginal bleeding as well as the ultrasound finding of thickened endometrium.

## 2023-07-04 NOTE — ED Triage Notes (Signed)
 Pt sts that she was here previously for constipation. Pt sts that she has tried to push to have a BM but nothing is happening. Pt sts that she now has been seeing blood in the toilet.

## 2023-07-04 NOTE — ED Provider Notes (Addendum)
 [name] [name] Provider Note    Event Date/Time   First MD Initiated Contact with Patient 07/04/23 1203     (approximate)   History   No chief complaint on file.   HPI  [name] [name] is a 73 y.o. female with history of hypertension, here for vaginal bleeding.  Patient states that she was having some lower abdominal pain.  Still has not had a bowel movement but is passing gas.  Noticed some blood when she was wiping after urinating.  Did not know if it send her urine or from her vagina.  States that she is postmenopausal.  No history of uterine cancer.  States that she was here on the 28th for abdominal pain.  Had a CT that was done that was unrevealing.  States that pain is still present but improving.  On independent review she had a CT done on 928 that showed diverticulosis without diverticulitis.  Prominent stool in the right colon.     Physical Exam   Triage Vital Signs: ED Triage Vitals [07/04/23 1122]  Encounter Vitals Group     BP 130/78     Systolic BP Percentile      Diastolic BP Percentile      Pulse Rate (!) 102     Resp 17     Temp (!) 97.5 F (36.4 C)     Temp Source Oral     SpO2 97 %     Weight 235 lb 14.3 oz (107 kg)     Height 5\' 10"  (1.778 m)     Head Circumference      Peak Flow      Pain Score 6     Pain Loc      Pain Education      Exclude from Growth Chart     Most recent vital signs: Vitals:   07/04/23 1122  BP: 130/78  Pulse: (!) 102  Resp: 17  Temp: (!) 97.5 F (36.4 C)  SpO2: 97%     General: Awake, no distress.  CV:  Good peripheral perfusion.  Resp:  Normal effort.  Abd:  No distention.  Soft, tender in the left lower quadrant Other:  GU exam was done with chaperone, she does have vaginal bleeding on external GU exam, rectal exam was done without stool in the rectal vault, no blood in her rectum.   ED Results / Procedures / Treatments   Labs (all labs ordered are listed, but only abnormal  results are displayed) Labs Reviewed  URINALYSIS, W/ REFLEX TO CULTURE (INFECTION SUSPECTED) - Abnormal; Notable for the following components:      Result Value   Color, Urine STRAW (*)    APPearance CLEAR (*)    Specific Gravity, Urine 1.043 (*)    Hgb urine dipstick LARGE (*)    Leukocytes,Ua TRACE (*)    All other components within normal limits  CBC  BASIC METABOLIC PANEL  HEPATIC FUNCTION PANEL     RADIOLOGY CT imaging on my interpretation without obvious free air.   PROCEDURES:  Critical Care performed: No  Procedures   MEDICATIONS ORDERED IN ED: Medications  iohexol (OMNIPAQUE) 300 MG/ML solution 75 mL (75 mLs Intravenous Contrast Given 07/04/23 1415)     IMPRESSION / MDM / ASSESSMENT AND PLAN / ED COURSE  I reviewed the triage vital signs and the nursing notes.  Differential diagnosis includes, but is not limited to, malignancy, mass, anemia, fibroid, polyp, for left lower quadrant abdominal pain, consider constipation, diverticulitis, colitis.  Will get labs, CT abdomen pelvis, transvaginal ultrasound.  Patient's presentation is most consistent with acute presentation with potential threat to life or bodily function.  Independent review of labs and imaging are below.  Ultrasound showed thickened endometrium to 9 mm.  Discussed findings with patient and she is agreeable plan for discharge and outpatient follow-up with OB/GYN.  Also instructed to take MiraLAX daily until she is stooling regularly.  Considered but no indication for inpatient admission at this time, she is safe for outpatient management.  Discharged with strict return precautions.  Clinical Course as of 07/04/23 1542  Sun Jul 04, 2023  1401 US PELVIC COMPLETE WITH TRANSVAGINAL IMPRESSION: Abnormally thickened endometrium measuring up to 9 mm. In the setting of post-menopausal bleeding, endometrial sampling is indicated to exclude carcinoma. If results are  benign, sonohysterogram should be considered for focal lesion work-up. (Ref: Radiological Reasoning: Algorithmic Workup of Abnormal Vaginal Bleeding with Endovaginal Sonography and Sonohysterography. AJR 2008; 119:J47-82)   [TT]  1426 Independent review of labs, no leukocytosis, LFTs are normal, electrolytes not deranged, creatinine is normal. [TT]  1509 CT ABDOMEN PELVIS W CONTRAST IMPRESSION: 1. No acute findings within the abdomen or pelvis. 2. Colonic diverticulosis without signs of acute diverticulitis. 3.  Aortic Atherosclerosis (ICD10-I70.0).   [TT]  1541 Urinalysis, w/ Reflex to Culture (Infection Suspected) -Urine, Clean Catch(!) Not consistent with UTI [TT]    Clinical Course User Index [TT] Jodie Echevaria, Franchot Erichsen, MD     FINAL CLINICAL IMPRESSION(S) / ED DIAGNOSES   Final diagnoses:  Vaginal bleeding  Left lower quadrant abdominal pain  Thickened endometrium     Rx / DC Orders   ED Discharge Orders     None        Note:  This document was prepared using Dragon voice recognition software and may include unintentional dictation errors.    Claybon Jabs, MD 07/04/23 1527    Claybon Jabs, MD 07/04/23 [phone]

## 2023-07-06 ENCOUNTER — Ambulatory Visit: Admitting: Obstetrics and Gynecology

## 2023-07-06 ENCOUNTER — Encounter: Payer: Self-pay | Admitting: Obstetrics and Gynecology

## 2023-07-06 VITALS — BP 119/82 | HR 93 | Ht 70.0 in | Wt 266.4 lb

## 2023-07-06 DIAGNOSIS — N950 Postmenopausal bleeding: Secondary | ICD-10-CM | POA: Diagnosis not present

## 2023-07-06 DIAGNOSIS — Z7689 Persons encountering health services in other specified circumstances: Secondary | ICD-10-CM

## 2023-07-06 MED ORDER — NORETHINDRONE ACETATE 5 MG PO TABS: 5.0000 mg | ORAL_TABLET | Freq: Two times a day (BID) | ORAL | 0 refills | Status: DC

## 2023-07-06 NOTE — Progress Notes (Signed)
 Patient presents today due to PMB. She states having stomach pain on 07/02/23 which led her to the ED where she was diagnosed with diverticulitis, on 3/2 she began to have vaginal bleeding along with pain. Reports the bleeding has been moderately and still having pain. CT and ultrasound preformed 07/04/23

## 2023-07-06 NOTE — Progress Notes (Addendum)
 HPI:      [name] is a 73 y.o. G1P1001 who LMP was No LMP recorded. Patient is postmenopausal.  Subjective:   She presents today after being seen in the emergency department for pelvic pain and vaginal bleeding.  Her CT shows possible diverticulitis causing the pelvic pain. An ultrasound shows a slightly thickened endometrium which may explain her endometrial/vaginal bleeding.  She has a small fibroid which seems to be of no consequence.    Hx: The following portions of the patient's history were reviewed and updated as appropriate:             She  has a past medical history of Arthritis, Complication of anesthesia, Hypertension, PONV (postoperative nausea and vomiting), Shortness of breath, and Vertigo. She does not have any pertinent problems on file. She  has a past surgical history that includes Foot surgery (Left); Carpal tunnel release (Right); Total knee revision (Left, 12/07/2012); Knee Closed Reduction (Left, 02/15/2013); Heel spur surgery; Knee arthrotomy (Left, 05/29/2013); Knee arthrotomy (Left, 03/05/2014); Hysteroscopy with D & C (N/A, 07/26/2015); Tubal ligation; Back surgery (2010); Breast surgery (Left, 2005); Joint replacement (Left, 2013); Total hip arthroplasty (Right, 12/24/2015); Breast biopsy (Left, 03/31/2011); Breast biopsy (Left, 03/31/2011); Breast biopsy (Right, 03/31/2011); Total hip arthroplasty (Left, 02/10/2018); Breast biopsy (Left, 04/23/2021); and Breast lumpectomy with radio frequency localizer (Left, 05/22/2021). Her family history includes Colon cancer in her father; Heart attack in her mother. She  reports that she has never smoked. She has never used smokeless tobacco. She reports that she does not drink alcohol and does not use drugs. She has a current medication list which includes the following prescription(s): acetaminophen, brimonidine, cephalexin, diphenhydramine, ibuprofen, losartan, meclizine, norethindrone, and triamcinolone cream. She  is allergic to [place] grass extract, hydrocodone, and oxycodone.       Review of Systems:  Review of Systems  Constitutional: Denied constitutional symptoms, night sweats, recent illness, fatigue, fever, insomnia and weight loss.  Eyes: Denied eye symptoms, eye pain, photophobia, vision change and visual disturbance.  Ears/Nose/Throat/Neck: Denied ear, nose, throat or neck symptoms, hearing loss, nasal discharge, sinus congestion and sore throat.  Cardiovascular: Denied cardiovascular symptoms, arrhythmia, chest pain/pressure, edema, exercise intolerance, orthopnea and palpitations.  Respiratory: Denied pulmonary symptoms, asthma, pleuritic pain, productive sputum, cough, dyspnea and wheezing.  Gastrointestinal: Denied, gastro-esophageal reflux, melena, nausea and vomiting.  Genitourinary: See HPI for additional information.  Musculoskeletal: Denied musculoskeletal symptoms, stiffness, swelling, muscle weakness and myalgia.  Dermatologic: Denied dermatology symptoms, rash and scar.  Neurologic: Denied neurology symptoms, dizziness, headache, neck pain and syncope.  Psychiatric: Denied psychiatric symptoms, anxiety and depression.  Endocrine: Denied endocrine symptoms including hot flashes and night sweats.   Meds:   Current Outpatient Medications on File Prior to Visit  Medication Sig Dispense Refill   acetaminophen (TYLENOL) 500 MG tablet Take 500-1,000 mg by mouth every 6 (six) hours as needed (pain.).     brimonidine (ALPHAGAN P) 0.1 % SOLN Place 1 drop into both eyes 2 (two) times daily.     cephALEXin (KEFLEX) 500 MG capsule Take 1 capsule (500 mg total) by mouth 2 (two) times daily for 7 days. 14 capsule 0   diphenhydrAMINE (BENADRYL) 25 MG tablet Take 25 mg by mouth daily as needed for allergies.     ibuprofen (ADVIL) 800 MG tablet Take 1 tablet (800 mg total) by mouth every 8 (eight) hours as needed for mild pain or moderate pain. 30 tablet 0   losartan (COZAAR) 50 MG tablet Take  50 mg by mouth 4 (four) times a week. At night     meclizine (ANTIVERT) 25 MG tablet Take 1 tablet (25 mg total) by mouth 3 (three) times daily as needed for dizziness. 30 tablet 0   triamcinolone cream (KENALOG) 0.1 % Apply 1 application topically daily as needed for rash.     No current facility-administered medications on file prior to visit.      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 Establishing care with new doctor, encounter for   2. PMB (postmenopausal bleeding)     Slightly thickened endometrium (9 mm)   Plan:            1.  We discussed endometrial hyperplasia and endometrial cancer in detail.  I have recommended an endometrial biopsy when her bleeding stops.  We discussed what it means to have a slightly thickened endometrium and its relationship to hyperplasia and cancer.  Will stop her bleeding with Aygestin and then once her bleeding stops perform an endometrial biopsy.  2.  She plans follow-up with her primary care doctor for her diverticulitis and abdominal pain. Orders No orders of the defined types were placed in this encounter.    Meds ordered this encounter  Medications   norethindrone (AYGESTIN) 5 MG tablet    Sig: Take 1 tablet (5 mg total) by mouth 2 (two) times daily for 21 days. As directed     Dispense:  42 tablet    Refill:  0      F/U  Return in about 1 week (around 07/13/2023).  Elonda Husky, M.D. 07/06/2023 3:32 PM

## 2023-07-13 ENCOUNTER — Ambulatory Visit: Admitting: Obstetrics and Gynecology

## 2023-07-13 ENCOUNTER — Encounter: Payer: Self-pay | Admitting: Obstetrics and Gynecology

## 2023-07-13 ENCOUNTER — Other Ambulatory Visit (HOSPITAL_COMMUNITY)
Admission: RE | Admit: 2023-07-13 | Discharge: 2023-07-13 | Disposition: A | Discharge status: Home / Self Care | Source: Ambulatory Visit | Attending: Obstetrics and Gynecology | Admitting: Obstetrics and Gynecology

## 2023-07-13 VITALS — BP 162/84 | HR 59 | Ht 70.0 in | Wt 270.2 lb

## 2023-07-13 DIAGNOSIS — R9389 Abnormal findings on diagnostic imaging of other specified body structures: Secondary | ICD-10-CM

## 2023-07-13 DIAGNOSIS — N840 Polyp of corpus uteri: Secondary | ICD-10-CM | POA: Diagnosis not present

## 2023-07-13 DIAGNOSIS — N950 Postmenopausal bleeding: Secondary | ICD-10-CM

## 2023-07-13 NOTE — Progress Notes (Unsigned)
 HPI:      [name] is a 73 y.o. G1P1001 who LMP was No LMP recorded. Patient is postmenopausal.  Subjective:   She presents today with a history of postmenopausal bleeding and thickened endometrium by ultrasound.  She is scheduled for an endometrial biopsy.  She has taken Aygestin and it has stopped her bleeding.    Hx: The following portions of the patient's history were reviewed and updated as appropriate:             She  has a past medical history of Arthritis, Complication of anesthesia, Hypertension, PONV (postoperative nausea and vomiting), Shortness of breath, and Vertigo. She does not have any pertinent problems on file. She  has a past surgical history that includes Foot surgery (Left); Carpal tunnel release (Right); Total knee revision (Left, 12/07/2012); Knee Closed Reduction (Left, 02/15/2013); Heel spur surgery; Knee arthrotomy (Left, 05/29/2013); Knee arthrotomy (Left, 03/05/2014); Hysteroscopy with D & C (N/A, 07/26/2015); Tubal ligation; Back surgery (2010); Breast surgery (Left, 2005); Joint replacement (Left, 2013); Total hip arthroplasty (Right, 12/24/2015); Breast biopsy (Left, 03/31/2011); Breast biopsy (Left, 03/31/2011); Breast biopsy (Right, 03/31/2011); Total hip arthroplasty (Left, 02/10/2018); Breast biopsy (Left, 04/23/2021); and Breast lumpectomy with radio frequency localizer (Left, 05/22/2021). Her family history includes Colon cancer in her father; Heart attack in her mother. She  reports that she has never smoked. She has never used smokeless tobacco. She reports that she does not drink alcohol and does not use drugs. She has a current medication list which includes the following prescription(s): acetaminophen, brimonidine, diphenhydramine, ibuprofen, losartan, meclizine, norethindrone, and triamcinolone cream. She is allergic to [place] grass extract, hydrocodone, and oxycodone.       Review of Systems:  Review of Systems  Constitutional: Denied  constitutional symptoms, night sweats, recent illness, fatigue, fever, insomnia and weight loss.  Eyes: Denied eye symptoms, eye pain, photophobia, vision change and visual disturbance.  Ears/Nose/Throat/Neck: Denied ear, nose, throat or neck symptoms, hearing loss, nasal discharge, sinus congestion and sore throat.  Cardiovascular: Denied cardiovascular symptoms, arrhythmia, chest pain/pressure, edema, exercise intolerance, orthopnea and palpitations.  Respiratory: Denied pulmonary symptoms, asthma, pleuritic pain, productive sputum, cough, dyspnea and wheezing.  Gastrointestinal: Denied, gastro-esophageal reflux, melena, nausea and vomiting.  Genitourinary: See HPI for additional information.  Musculoskeletal: Denied musculoskeletal symptoms, stiffness, swelling, muscle weakness and myalgia.  Dermatologic: Denied dermatology symptoms, rash and scar.  Neurologic: Denied neurology symptoms, dizziness, headache, neck pain and syncope.  Psychiatric: Denied psychiatric symptoms, anxiety and depression.  Endocrine: Denied endocrine symptoms including hot flashes and night sweats.   Meds:   Current Outpatient Medications on File Prior to Visit  Medication Sig Dispense Refill   acetaminophen (TYLENOL) 500 MG tablet Take 500-1,000 mg by mouth every 6 (six) hours as needed (pain.).     brimonidine (ALPHAGAN P) 0.1 % SOLN Place 1 drop into both eyes 2 (two) times daily.     diphenhydrAMINE (BENADRYL) 25 MG tablet Take 25 mg by mouth daily as needed for allergies.     ibuprofen (ADVIL) 800 MG tablet Take 1 tablet (800 mg total) by mouth every 8 (eight) hours as needed for mild pain or moderate pain. 30 tablet 0   losartan (COZAAR) 50 MG tablet Take 50 mg by mouth 4 (four) times a week. At night     meclizine (ANTIVERT) 25 MG tablet Take 1 tablet (25 mg total) by mouth 3 (three) times daily as needed for dizziness. 30 tablet 0   norethindrone (AYGESTIN) 5 MG  tablet Take 1 tablet (5 mg total) by mouth 2  (two) times daily for 21 days. As directed 42 tablet 0   triamcinolone cream (KENALOG) 0.1 % Apply 1 application topically daily as needed for rash.     No current facility-administered medications on file prior to visit.      Objective:     Vitals:   07/13/23 1424 07/13/23 1447  BP: (!) 196/73 (!) 162/84  Pulse: (!) 59    Filed Weights   07/13/23 1424  Weight: 270 lb 3.2 oz (122.6 kg)              Endometrial Biopsy After discussion with the patient regarding her abnormal uterine bleeding I recommended that she proceed with an endometrial biopsy for further diagnosis. The risks, benefits, alternatives, and indications for an endometrial biopsy were discussed with the patient in detail. She understood the risks including infection, bleeding, cervical laceration and uterine perforation.  Verbal consent was obtained.   PROCEDURE NOTE:  Vacurette endometrial biopsy was performed using aseptic technique with iodine preparation.  The uterus was sounded to a length of 8 cm.  Adequate sampling was obtained with minimal blood loss.  Minimal tissue was obtained but I believe there was an excellent sample from the correct location.  The patient tolerated the procedure well.  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 PMB (postmenopausal bleeding)        Plan:            1.  Await biopsy results and discuss management at that time. Orders No orders of the defined types were placed in this  encounter.   No orders of the defined types were placed in this encounter.     F/U  Return for We will contact her with any abnormal test results.  Elonda Husky, M.D. 07/13/2023 3:11 PM

## 2023-07-13 NOTE — Progress Notes (Unsigned)
 Patient presents today for an endometrial biopsy. She states since taking Aygestin the bleeding has stopped, she has not taken any of the medication today.

## 2023-07-15 LAB — SURGICAL PATHOLOGY

## 2023-09-01 ENCOUNTER — Encounter: Payer: Self-pay | Admitting: Ophthalmology

## 2023-09-01 NOTE — Anesthesia Preprocedure Evaluation (Addendum)
 Anesthesia Evaluation  Patient identified by MRN, date of birth, ID band Patient awake    Reviewed: Allergy & Precautions, H&P , NPO status , Patient's Chart, lab work & pertinent test results  Airway Mallampati: II  TM Distance: >3 FB Neck ROM: full    Dental no notable dental hx.    Pulmonary shortness of breath and with exertion   Pulmonary exam normal        Cardiovascular Exercise Tolerance: Poor hypertension,  Rhythm:Regular Rate:Bradycardia     Neuro/Psych negative neurological ROS  negative psych ROS   GI/Hepatic negative GI ROS, Neg liver ROS,,,  Endo/Other  negative endocrine ROS    Renal/GU negative Renal ROS  negative genitourinary   Musculoskeletal  (+) Arthritis ,    Abdominal  (+) + obese  Peds  Hematology negative hematology ROS (+)   Anesthesia Other Findings Hypertension  Arthritis Vertigo  Complication of anesthesia PONV (postoperative nausea and vomiting) Shortness of breath PMB (postmenopausal bleeding)  Wears dentures Wears hearing aid in both ears  Left leg numbnes    Reproductive/Obstetrics negative OB ROS                             Anesthesia Physical Anesthesia Plan  ASA: 2  Anesthesia Plan: MAC   Post-op Pain Management:    Induction:   PONV Risk Score and Plan:   Airway Management Planned: Natural Airway  Additional Equipment:   Intra-op Plan:   Post-operative Plan:   Informed Consent: I have reviewed the patients History and Physical, chart, labs and discussed the procedure including the risks, benefits and alternatives for the proposed anesthesia with the patient or authorized representative who has indicated his/her understanding and acceptance.       Plan Discussed with: CRNA and Surgeon  Anesthesia Plan Comments:         Anesthesia Quick Evaluation

## 2023-09-03 NOTE — Discharge Instructions (Signed)

## 2023-09-07 ENCOUNTER — Encounter
Admission: RE | Disposition: A | Discharge status: Home / Self Care | Payer: Self-pay | Source: Home / Self Care | Attending: Ophthalmology

## 2023-09-07 ENCOUNTER — Ambulatory Visit: Payer: Self-pay | Admitting: Anesthesiology

## 2023-09-07 ENCOUNTER — Other Ambulatory Visit: Payer: Self-pay

## 2023-09-07 ENCOUNTER — Ambulatory Visit
Admission: RE | Admit: 2023-09-07 | Discharge: 2023-09-07 | Disposition: A | Discharge status: Home / Self Care | Attending: Ophthalmology | Admitting: Ophthalmology

## 2023-09-07 ENCOUNTER — Encounter: Payer: Self-pay | Admitting: Ophthalmology

## 2023-09-07 DIAGNOSIS — M1990 Unspecified osteoarthritis, unspecified site: Secondary | ICD-10-CM | POA: Insufficient documentation

## 2023-09-07 DIAGNOSIS — I10 Essential (primary) hypertension: Secondary | ICD-10-CM | POA: Diagnosis not present

## 2023-09-07 DIAGNOSIS — Z8249 Family history of ischemic heart disease and other diseases of the circulatory system: Secondary | ICD-10-CM | POA: Diagnosis not present

## 2023-09-07 DIAGNOSIS — H2511 Age-related nuclear cataract, right eye: Secondary | ICD-10-CM | POA: Insufficient documentation

## 2023-09-07 HISTORY — DX: Presence of external hearing-aid: Z97.4

## 2023-09-07 HISTORY — DX: Presence of dental prosthetic device (complete) (partial): Z97.2

## 2023-09-07 HISTORY — PX: CATARACT EXTRACTION W/PHACO: SHX586

## 2023-09-07 HISTORY — DX: Anesthesia of skin: R20.0

## 2023-09-07 SURGERY — PHACOEMULSIFICATION, CATARACT, WITH IOL INSERTION
Anesthesia: Monitor Anesthesia Care | Site: Eye | Laterality: Right

## 2023-09-07 MED ORDER — MIDAZOLAM HCL 2 MG/2ML IJ SOLN
INTRAMUSCULAR | Status: AC
  Filled 2023-09-07: qty 2

## 2023-09-07 MED ORDER — ARMC OPHTHALMIC DILATING DROPS
OPHTHALMIC | Status: AC
  Filled 2023-09-07: qty 0.5

## 2023-09-07 MED ORDER — TETRACAINE HCL 0.5 % OP SOLN
OPHTHALMIC | Status: AC
  Filled 2023-09-07: qty 4

## 2023-09-07 MED ORDER — MOXIFLOXACIN HCL 0.5 % OP SOLN
OPHTHALMIC | Status: DC | PRN
  Administered 2023-09-07: .2 mL via OPHTHALMIC

## 2023-09-07 MED ORDER — ARMC OPHTHALMIC DILATING DROPS
1.0000 | OPHTHALMIC | Status: DC | PRN
  Administered 2023-09-07 (×3): 1 via OPHTHALMIC

## 2023-09-07 MED ORDER — FENTANYL CITRATE (PF) 100 MCG/2ML IJ SOLN
INTRAMUSCULAR | Status: AC
  Filled 2023-09-07: qty 2

## 2023-09-07 MED ORDER — SIGHTPATH DOSE#1 BSS IO SOLN
INTRAOCULAR | Status: DC | PRN
  Administered 2023-09-07: 15 mL via INTRAOCULAR

## 2023-09-07 MED ORDER — MIDAZOLAM HCL 2 MG/2ML IJ SOLN
INTRAMUSCULAR | Status: DC | PRN
  Administered 2023-09-07: 1 mg via INTRAVENOUS

## 2023-09-07 MED ORDER — SIGHTPATH DOSE#1 BSS IO SOLN
INTRAOCULAR | Status: DC | PRN
  Administered 2023-09-07: 49 mL via OPHTHALMIC

## 2023-09-07 MED ORDER — BRIMONIDINE TARTRATE-TIMOLOL 0.2-0.5 % OP SOLN
OPHTHALMIC | Status: DC | PRN
  Administered 2023-09-07: 1 [drp] via OPHTHALMIC

## 2023-09-07 MED ORDER — TETRACAINE HCL 0.5 % OP SOLN
1.0000 [drp] | OPHTHALMIC | Status: DC | PRN
  Administered 2023-09-07 (×3): 1 [drp] via OPHTHALMIC

## 2023-09-07 MED ORDER — LIDOCAINE HCL (PF) 2 % IJ SOLN
INTRAOCULAR | Status: DC | PRN
  Administered 2023-09-07: 2 mL

## 2023-09-07 MED ORDER — SIGHTPATH DOSE#1 NA CHONDROIT SULF-NA HYALURON 40-17 MG/ML IO SOLN
INTRAOCULAR | Status: DC | PRN
  Administered 2023-09-07: 1 mL via INTRAOCULAR

## 2023-09-07 MED ORDER — FENTANYL CITRATE (PF) 100 MCG/2ML IJ SOLN
INTRAMUSCULAR | Status: DC | PRN
  Administered 2023-09-07: 50 ug via INTRAVENOUS

## 2023-09-07 SURGICAL SUPPLY — 13 items
CATARACT SUITE SIGHTPATH (MISCELLANEOUS) ×1 IMPLANT
CYSTOTOME ANGL RVRS SHRT 25G (CUTTER) ×1 IMPLANT
CYSTOTOME ANGL RVRS SHRT 25GA (CUTTER) ×1 IMPLANT
FEE CATARACT SUITE SIGHTPATH (MISCELLANEOUS) ×1 IMPLANT
GLOVE BIOGEL PI IND STRL 8 (GLOVE) ×1 IMPLANT
GLOVE SURG LX STRL 8.0 MICRO (GLOVE) ×1 IMPLANT
GLOVE SURG PROTEXIS BL SZ6.5 (GLOVE) ×1 IMPLANT
GLOVE SURG SYN 6.5 PF PI BL (GLOVE) ×1 IMPLANT
LENS CLAREON WAGON WHEEL 23.5 (Intraocular Lens) ×1 IMPLANT
LENS IOL CLRN WGN WHL 23.5 (Intraocular Lens) IMPLANT
NDL FILTER BLUNT 18X1 1/2 (NEEDLE) ×1 IMPLANT
NEEDLE FILTER BLUNT 18X1 1/2 (NEEDLE) ×1 IMPLANT
SYR 3ML LL SCALE MARK (SYRINGE) ×1 IMPLANT

## 2023-09-07 NOTE — Transfer of Care (Signed)
 Immediate Anesthesia Transfer of Care Note  Patient: [name] [name]  Procedure(s) Performed: PHACOEMULSIFICATION, CATARACT, WITH IOL INSERTION 6.51 00:39.4 (Right: Eye)  Patient Location: PACU  Anesthesia Type: MAC  Level of Consciousness: awake, alert  and patient cooperative  Airway and Oxygen Therapy: Patient Spontanous Breathing and Patient connected to supplemental oxygen  Post-op Assessment: Post-op Vital signs reviewed, Patient's Cardiovascular Status Stable, Respiratory Function Stable, Patent Airway and No signs of Nausea or vomiting  Post-op Vital Signs: Reviewed and stable  Complications: No notable events documented.

## 2023-09-07 NOTE — H&P (Signed)
 [facility]   Primary Care Physician:  Monique Ano, MD Ophthalmologist: Dr. Merrell Abate  Pre-Procedure History & Physical: HPI:  [name] [name] is a 73 y.o. female here for cataract surgery.   Past Medical History:  Diagnosis Date   Arthritis    Complication of anesthesia    terrible vertigo   Hypertension    Left leg numbness    S/P 3 knee surgeries   PMB (postmenopausal bleeding) 07/04/2023   Seeing Dr Luster Salters   PONV (postoperative nausea and vomiting)    related to vertigo   Shortness of breath    WITH EXERTION   Vertigo    hx post op (and when dehydrated)   Wears dentures    full upper, partial lower   Wears hearing aid in both ears     Past Surgical History:  Procedure Laterality Date   BACK SURGERY  2010   cervical fusion   BREAST BIOPSY Left 03/31/2011   fibrocystic changes, marked stromal sclerosis, and dense stromal calcifications   BREAST BIOPSY Left 03/31/2011   BREAST BIOPSY Right 03/31/2011   fine needle aspiration with clip placement - ultrasound   BREAST BIOPSY Left 04/23/2021   Stereo Bx, X clip, path pending   BREAST LUMPECTOMY WITH RADIO FREQUENCY LOCALIZER Left 05/22/2021   Procedure: BREAST LUMPECTOMY WITH RADIO FREQUENCY LOCALIZER;  Surgeon: Conrado Delay, DO;  Location: [facility];  Service: General;  Laterality: Left;   BREAST SURGERY Left 2005   biopsy   CARPAL TUNNEL RELEASE Right    FOOT SURGERY Left    heel---STATES METAL IN THE HEEL - FOOT TURNS OUTWARD   HEEL SPUR SURGERY     HYSTEROSCOPY WITH D & C N/A 07/26/2015   Procedure: DILATATION AND CURETTAGE /HYSTEROSCOPY with cervical biopsy;  Surgeon: Prescilla Brod, MD;  Location: [facility];  Service: Gynecology;  Laterality: N/A;   JOINT REPLACEMENT Left 2013   knee   KNEE ARTHROTOMY Left 05/29/2013   Procedure: LEFT KNEE ARTHROTOMY WITH SCAR EXCISION;  Surgeon: Aurther Blue, MD;  Location: [facility];  Service: Orthopedics;  Laterality: Left;   KNEE ARTHROTOMY Left 03/05/2014    Procedure: LEFT KNEE ARTHROTOMY/SCAR EXCISION/POLYETHYLENE REVISION;  Surgeon: Aurther Blue, MD;  Location: [facility];  Service: Orthopedics;  Laterality: Left;   KNEE CLOSED REDUCTION Left 02/15/2013   Procedure: CLOSED MANIPULATION LEFT KNEE;  Surgeon: Aurther Blue, MD;  Location: [facility];  Service: Orthopedics;  Laterality: Left;   TOTAL HIP ARTHROPLASTY Right 12/24/2015   Procedure: TOTAL HIP ARTHROPLASTY ANTERIOR APPROACH;  Surgeon: Molli Angelucci, MD;  Location: [facility];  Service: Orthopedics;  Laterality: Right;   TOTAL HIP ARTHROPLASTY Left 02/10/2018   Procedure: TOTAL HIP ARTHROPLASTY ANTERIOR APPROACH;  Surgeon: Molli Angelucci, MD;  Location: [facility];  Service: Orthopedics;  Laterality: Left;   TOTAL KNEE REVISION Left 12/07/2012   Procedure:  TOTAL KNEE ARTHROPLASTY REVISION;  Surgeon: Aurther Blue, MD;  Location: [facility];  Service: Orthopedics;  Laterality: Left;   TUBAL LIGATION      Prior to Admission medications   Medication Sig Start Date End Date Taking? Authorizing Provider  brimonidine  (ALPHAGAN  P) 0.1 % SOLN Place 1 drop into both eyes 2 (two) times daily.   Yes [provider]  chlorthalidone (HYGROTON) 25 MG tablet Take 12.5 mg by mouth daily.   Yes [provider]  diphenhydrAMINE  (BENADRYL ) 25 MG tablet Take 25 mg by mouth daily as needed for allergies.   Yes [provider]  latanoprost (  XALATAN) 0.005 % ophthalmic solution Place 1 drop into both eyes at bedtime.   Yes [provider]  losartan (COZAAR) 50 MG tablet Take 50 mg by mouth daily. At night   Yes [provider]  meclizine  (ANTIVERT ) 25 MG tablet Take 1 tablet (25 mg total) by mouth 3 (three) times daily as needed for dizziness. 04/30/15  Yes Darolyn Elks, MD  Netarsudil Dimesylate (RHOPRESSA) 0.02 % SOLN Apply to eye at bedtime.   Yes [provider]  triamcinolone  cream (KENALOG ) 0.1 % Apply 1 application topically daily as needed for rash.    Yes [provider]  acetaminophen  (TYLENOL ) 500 MG tablet Take 500-1,000 mg by mouth every 6 (six) hours as needed (pain.).    [provider]    Allergies as of 08/23/2023 - Review Complete 07/13/2023  Allergen Reaction Noted   [place] grass extract Other (See Comments) 07/10/2013   Hydrocodone  Itching 02/02/2018   Oxycodone Other (See Comments) 11/28/2012    Family History  Problem Relation Age of Onset   Heart attack Mother    Colon cancer Father    Breast cancer Neg Hx     Social History   Socioeconomic History   Marital status: Single    Spouse name: Not on file   Number of children: Not on file   Years of education: Not on file   Highest education level: Not on file  Occupational History   Not on file  Tobacco Use   Smoking status: Never   Smokeless tobacco: Never  Vaping Use   Vaping status: Never Used  Substance and Sexual Activity   Alcohol use: No   Drug use: No   Sexual activity: Not Currently    Birth control/protection: Post-menopausal  Other Topics Concern   Not on file  Social History Narrative   Not on file   Social Drivers of Health   Financial Resource Strain: Low Risk  (07/20/2023)   Received from [facility]   Overall Financial Resource Strain (CARDIA)    Difficulty of Paying Living Expenses: Not hard at all  Food Insecurity: No Food Insecurity (07/20/2023)   Received from [facility]   Hunger Vital Sign    Worried About Running Out of Food in the Last Year: Never true    Ran Out of Food in the Last Year: Never true  Transportation Needs: No Transportation Needs (07/20/2023)   Received from [facility] - Transportation    In the past 12 months, has lack of transportation kept you from medical appointments or from getting medications?: No    Lack of Transportation (Non-Medical): No  Physical Activity: Insufficiently Active (02/10/2018)   Exercise Vital Sign     Days of Exercise per Week: 2 days    Minutes of Exercise per Session: 30 min  Stress: Stress Concern Present (02/10/2018)   [employer] of Occupational Health - Occupational Stress Questionnaire    Feeling of Stress : Rather much  Social Connections: Unknown (02/10/2018)   Social Connection and Isolation Panel [NHANES]    Frequency of Communication with Friends and Family: Patient declined    Frequency of Social Gatherings with Friends and Family: Patient declined    Attends Religious Services: Patient declined    Active Member of Clubs or Organizations: Patient declined    Attends Banker Meetings: Patient declined    Marital Status: Patient declined  Intimate Partner Violence: Unknown (02/10/2018)   Humiliation,  Afraid, Rape, and Kick questionnaire    Fear of Current or Ex-Partner: Patient declined    Emotionally Abused: Patient declined    Physically Abused: Patient declined    Sexually Abused: Patient declined    Review of Systems: See HPI, otherwise negative ROS  Physical Exam: BP (!) 135/97   Temp 98 F (36.7 C) (Temporal)   Resp 14   Ht 5\' 10"  (1.778 m)   Wt 121.6 kg   SpO2 97%   BMI 38.45 kg/m  General:   Alert, cooperative. Head:  Normocephalic and atraumatic. Respiratory:  Normal work of breathing. Cardiovascular:  NAD  Impression/Plan: [name] [name] is here for cataract surgery.  Risks, benefits, limitations, and alternatives regarding cataract surgery have been reviewed with the patient.  Questions have been answered.  All parties agreeable.   Clair Crews, MD  09/07/2023, 9:34 AM

## 2023-09-07 NOTE — Op Note (Signed)
 PREOPERATIVE DIAGNOSIS:  Nuclear sclerotic cataract of the right eye.   POSTOPERATIVE DIAGNOSIS:  Right Eye Cataract   OPERATIVE PROCEDURE:ORPROCALL@   SURGEON:  Clair Crews, MD.   ANESTHESIA:  Anesthesiologist: Baltazar Bonier, MD CRNA: Sherrlyn Dolores, CRNA  1.      Managed anesthesia care. 2.      0.90ml of Shugarcaine was instilled in the eye following the paracentesis.   COMPLICATIONS:  None.   TECHNIQUE:   Stop and chop   DESCRIPTION OF PROCEDURE:  The patient was examined and consented in the preoperative holding area where the aforementioned topical anesthesia was applied to the right eye and then brought back to the Operating Room where the right eye was prepped and draped in the usual sterile ophthalmic fashion and a lid speculum was placed. A paracentesis was created with the side port blade and the anterior chamber was filled with viscoelastic. A near clear corneal incision was performed with the steel keratome. A continuous curvilinear capsulorrhexis was performed with a cystotome followed by the capsulorrhexis forceps. Hydrodissection and hydrodelineation were carried out with BSS on a blunt cannula. The lens was removed in a stop and chop  technique and the remaining cortical material was removed with the irrigation-aspiration handpiece. The capsular bag was inflated with viscoelastic and the Technis [id number]  lens was placed in the capsular bag without complication. The remaining viscoelastic was removed from the eye with the irrigation-aspiration handpiece. The wounds were hydrated. The anterior chamber was flushed with BSS and the eye was inflated to physiologic pressure. 0.1ml of Vigamox was placed in the anterior chamber. The wounds were found to be water  tight. The eye was dressed with Combigan. The patient was given protective glasses to wear throughout the day and a shield with which to sleep tonight. The patient was also given drops with which to begin a drop  regimen today and will follow-up with me in one day. Implant Name Type Inv. Item Serial No. Manufacturer Lot No. LRB No. Used Action  LENS [employer] WAGON WHEEL 23.5 - [id number] Intraocular Lens LENS [employer] WAGON WHEEL 23.5 [id number] [employer]  Right 1 Implanted   Procedure(s): PHACOEMULSIFICATION, CATARACT, WITH IOL INSERTION 6.51 00:39.4 (Right)  Electronically signed: Clair Crews 09/07/2023 10:00 AM

## 2023-09-07 NOTE — Anesthesia Postprocedure Evaluation (Signed)
 Anesthesia Post Note  Patient: [name] [name]  Procedure(s) Performed: PHACOEMULSIFICATION, CATARACT, WITH IOL INSERTION 6.51 00:39.4 (Right: Eye)  Patient location during evaluation: PACU Anesthesia Type: MAC Level of consciousness: awake and alert Pain management: pain level controlled Vital Signs Assessment: post-procedure vital signs reviewed and stable Respiratory status: spontaneous breathing, nonlabored ventilation and respiratory function stable Cardiovascular status: stable and blood pressure returned to baseline Postop Assessment: no apparent nausea or vomiting Anesthetic complications: no   No notable events documented.   Last Vitals:  Vitals:   09/07/23 1004 09/07/23 1011  BP: 117/72 136/63  Pulse: (!) 46 (!) 48  Resp: 13 15  Temp: (!) 36.3 C (!) 36.3 C  SpO2: 96% 96%    Last Pain:  Vitals:   09/07/23 1011  TempSrc:   PainSc: 0-No pain                 Baltazar Bonier

## 2024-05-22 ENCOUNTER — Encounter: Payer: Self-pay | Admitting: Internal Medicine

## 2024-05-30 ENCOUNTER — Encounter: Payer: Self-pay | Admitting: Internal Medicine

## 2024-05-30 ENCOUNTER — Ambulatory Visit
Admission: RE | Admit: 2024-05-30 | Discharge: 2024-05-30 | Disposition: A | Discharge status: Home / Self Care | Attending: Internal Medicine | Admitting: Internal Medicine

## 2024-05-30 ENCOUNTER — Ambulatory Visit: Admitting: Anesthesiology

## 2024-05-30 ENCOUNTER — Encounter
Admission: RE | Disposition: A | Discharge status: Home / Self Care | Payer: Self-pay | Source: Home / Self Care | Attending: Internal Medicine

## 2024-05-30 ENCOUNTER — Other Ambulatory Visit: Payer: Self-pay

## 2024-05-30 MED ORDER — LIDOCAINE HCL (CARDIAC) PF 100 MG/5ML IV SOSY
PREFILLED_SYRINGE | INTRAVENOUS | Status: DC | PRN
  Administered 2024-05-30: 60 mg via INTRAVENOUS

## 2024-05-30 MED ORDER — PROPOFOL 500 MG/50ML IV EMUL
INTRAVENOUS | Status: DC | PRN
  Administered 2024-05-30: 75 ug/kg/min via INTRAVENOUS

## 2024-05-30 MED ORDER — PROPOFOL 10 MG/ML IV BOLUS
INTRAVENOUS | Status: DC | PRN
  Administered 2024-05-30 (×2): 40 mg via INTRAVENOUS

## 2024-05-30 MED ORDER — SODIUM CHLORIDE 0.9 % IV SOLN: INTRAVENOUS | Status: DC

## 2024-05-30 MED ORDER — LIDOCAINE HCL (PF) 2 % IJ SOLN
INTRAMUSCULAR | Status: AC
  Filled 2024-05-30: qty 5

## 2024-05-30 NOTE — Anesthesia Preprocedure Evaluation (Signed)
"                                    Anesthesia Evaluation  Patient identified by MRN, date of birth, ID band Patient awake    Reviewed: Allergy & Precautions, H&P , NPO status , Patient's Chart, lab work & pertinent test results  History of Anesthesia Complications (+) PONV and history of anesthetic complications  Airway Mallampati: I  TM Distance: >3 FB Neck ROM: full    Dental  (+) Upper Dentures, Partial Lower   Pulmonary shortness of breath and with exertion, neg sleep apnea, neg COPD, neg recent URI   Pulmonary exam normal        Cardiovascular Exercise Tolerance: Poor hypertension, (-) angina (-) Past MI and (-) Cardiac Stents (-) dysrhythmias (-) Valvular Problems/Murmurs Rhythm:Regular Rate:Bradycardia     Neuro/Psych negative neurological ROS  negative psych ROS   GI/Hepatic negative GI ROS, Neg liver ROS,,,  Endo/Other  negative endocrine ROSneg diabetes    Renal/GU negative Renal ROS  negative genitourinary   Musculoskeletal  (+) Arthritis ,    Abdominal  (+) + obese  Peds  Hematology negative hematology ROS (+)   Anesthesia Other Findings Hypertension  Arthritis Vertigo  Complication of anesthesia PONV (postoperative nausea and vomiting) Shortness of breath PMB (postmenopausal bleeding)  Wears dentures Wears hearing aid in both ears  Left leg numbnes    Reproductive/Obstetrics negative OB ROS                              Anesthesia Physical Anesthesia Plan  ASA: 2  Anesthesia Plan: General   Post-op Pain Management:    Induction: Intravenous  PONV Risk Score and Plan: 4 or greater and Propofol  infusion, TIVA and Treatment may vary due to age or medical condition  Airway Management Planned: Natural Airway and Nasal Cannula  Additional Equipment:   Intra-op Plan:   Post-operative Plan:   Informed Consent: I have reviewed the patients History and Physical, chart, labs and discussed the  procedure including the risks, benefits and alternatives for the proposed anesthesia with the patient or authorized representative who has indicated his/her understanding and acceptance.       Plan Discussed with: CRNA and Surgeon  Anesthesia Plan Comments:          Anesthesia Quick Evaluation  "

## 2024-05-30 NOTE — Transfer of Care (Signed)
 Immediate Anesthesia Transfer of Care Note  Patient: [name] [name]  Procedure(s) Performed: COLONOSCOPY POLYPECTOMY, INTESTINE CONTROL OF HEMORRHAGE, GI TRACT, ENDOSCOPIC, BY CLIPPING OR OVERSEWING  Patient Location: PACU  Anesthesia Type:General  Level of Consciousness: sedated  Airway & Oxygen Therapy: Patient Spontanous Breathing  Post-op Assessment: Report given to RN and Post -op Vital signs reviewed and stable  Post vital signs: Reviewed and stable  Last Vitals:  Vitals Value Taken Time  BP    Temp    Pulse 69 05/30/24 10:47  Resp 15 05/30/24 10:47  SpO2 100 % 05/30/24 10:47  Vitals shown include unfiled device data.  Last Pain:  Vitals:   05/30/24 0948  TempSrc: Tympanic  PainSc: 0-No pain         Complications: No notable events documented.

## 2024-05-30 NOTE — Op Note (Signed)
 [facility] Patient Name: [name] [name] Procedure Date: 05/30/2024 10:08 AM MRN: [id number] Account #: [id number] Date of Birth: 1950/05/20 Admit Type: Outpatient Age: 74 Room: [facility] ENDO ROOM 1 Gender: Female Note Status: Finalized Instrument Name: Colon Scope [id number] Procedure:             Colonoscopy Indications:           High risk colon cancer surveillance: Personal history                         of multiple (3 or more) adenomas Providers:             Denee Boeder K. Aundria MD, MD Referring MD:          Alda Carpen (Referring MD) Medicines:             Propofol  per Anesthesia Complications:         No immediate complications. Estimated blood loss:                         Minimal. Procedure:             Pre-Anesthesia Assessment:                        - The risks and benefits of the procedure and the                         sedation options and risks were discussed with the                         patient. All questions were answered and informed                         consent was obtained.                        - Patient identification and proposed procedure were                         verified prior to the procedure by the nurse. The                         procedure was verified in the procedure room.                        - ASA Grade Assessment: II - A patient with mild                         systemic disease.                        - After reviewing the risks and benefits, the patient                         was deemed in satisfactory condition to undergo the                         procedure.                        After obtaining informed  consent, the colonoscope was                         passed under direct vision. Throughout the procedure,                         the patient's blood pressure, pulse, and oxygen                         saturations were monitored continuously. The                         Colonoscope was  introduced through the anus and                         advanced to the the cecum, identified by appendiceal                         orifice and ileocecal valve. The colonoscopy was                         performed without difficulty. The quality of the bowel                         preparation was good. The ileocecal valve, appendiceal                         orifice, and rectum were photographed. The colonoscopy                         was somewhat difficult due to significant looping.                         Successful completion of the procedure was aided by                         applying abdominal pressure. Findings:      The perianal and digital rectal examinations were normal. Pertinent       negatives include normal sphincter tone and no palpable rectal lesions.      Many small-mouthed diverticula were found in the sigmoid colon and       descending colon.      Six sessile polyps were found in the ascending colon. The polyps were 4       to 11 mm in size. These polyps were removed with a cold snare. Resection       and retrieval were complete. To prevent bleeding after the polypectomy,       one hemostatic clip was successfully placed (MR conditional). Clip       manufacturer: [employer]. There was no bleeding during, or at the       end, of the procedure. Estimated blood loss was minimal.      Two sessile polyps were found in the transverse colon. The polyps were       10 to 12 mm in size. These polyps were removed with a hot snare.       Resection and retrieval were complete. Estimated blood loss: none.      A 3 mm polyp was found in the cecum. The polyp was sessile. The  polyp       was removed with a cold biopsy forceps. Resection and retrieval were       complete. Estimated blood loss was minimal.      Three sessile polyps were found in the splenic flexure. The polyps were       4 to 6 mm in size. These polyps were removed with a jumbo cold forceps.       Resection  and retrieval were complete. Estimated blood loss was minimal.      The exam was otherwise without abnormality on direct and retroflexion       views. Impression:            - Diverticulosis in the sigmoid colon and in the                         descending colon.                        - Six 4 to 11 mm polyps in the ascending colon,                         removed with a cold snare. Resected and retrieved.                         Clip (MR conditional) was placed. Clip manufacturer:                         [employer].                        - Two 10 to 12 mm polyps in the transverse colon,                         removed with a hot snare. Resected and retrieved.                        - One 3 mm polyp in the cecum, removed with a cold                         biopsy forceps. Resected and retrieved.                        - Three 4 to 6 mm polyps at the splenic flexure,                         removed with a jumbo cold forceps. Resected and                         retrieved.                        - The examination was otherwise normal on direct and                         retroflexion views. Recommendation:        - Patient has a contact number available for                         emergencies. The signs and  symptoms of potential                         delayed complications were discussed with the patient.                         Return to normal activities tomorrow. Written                         discharge instructions were provided to the patient.                        - Resume previous diet.                        - Continue present medications.                        - Repeat colonoscopy is recommended for surveillance.                         The colonoscopy date will be determined after                         pathology results from today's exam become available                         for review.                        - Return to GI office PRN.                        -  The findings and recommendations were discussed with                         the patient. Procedure Code(s):     --- Professional ---                        [phone], Colonoscopy, flexible; with removal of                         tumor(s), polyp(s), or other lesion(s) by snare                         technique                        45380, 59, Colonoscopy, flexible; with biopsy, single                         or multiple Diagnosis Code(s):     --- Professional ---                        K57.30, Diverticulosis of large intestine without                         perforation or abscess without bleeding                        D12.0, Benign neoplasm of cecum  D12.3, Benign neoplasm of transverse colon (hepatic                         flexure or splenic flexure)                        D12.2, Benign neoplasm of ascending colon                        Z86.010, Personal history of colonic polyps CPT copyright 2022 American Medical Association. All rights reserved. The codes documented in this report are preliminary and upon coder review may  be revised to meet current compliance requirements. Ladell MARLA Boss MD, MD 05/30/2024 10:51:32 AM This report has been signed electronically. Number of Addenda: 0 Note Initiated On: 05/30/2024 10:08 AM Scope Withdrawal Time: 0 hours 20 minutes 4 seconds  Total Procedure Duration: 0 hours 26 minutes 56 seconds  Estimated Blood Loss:  Estimated blood loss was minimal.      [facility]

## 2024-05-31 LAB — SURGICAL PATHOLOGY
